# Patient Record
Sex: Female | Born: 1970 | Race: Black or African American | Hispanic: No | Smoking: Never smoker
Health system: Southern US, Community
[De-identification: ages and names within clinical notes are randomized; demographics above are authoritative.]

## PROBLEM LIST (undated history)

## (undated) DIAGNOSIS — Z21 Asymptomatic human immunodeficiency virus [HIV] infection status: Secondary | ICD-10-CM

## (undated) DIAGNOSIS — B20 Human immunodeficiency virus [HIV] disease: Secondary | ICD-10-CM

## (undated) DIAGNOSIS — E119 Type 2 diabetes mellitus without complications: Secondary | ICD-10-CM

## (undated) HISTORY — DX: Type 2 diabetes mellitus without complications: E11.9

## (undated) HISTORY — DX: Human immunodeficiency virus (HIV) disease: B20

## (undated) HISTORY — PX: OTHER SURGICAL HISTORY: SHX169

## (undated) HISTORY — DX: Asymptomatic human immunodeficiency virus (hiv) infection status: Z21

---

## 2006-09-08 ENCOUNTER — Ambulatory Visit: Payer: Self-pay | Admitting: Internal Medicine

## 2006-09-08 ENCOUNTER — Encounter: Admission: RE | Admit: 2006-09-08 | Discharge: 2006-09-08 | Payer: Self-pay | Admitting: Internal Medicine

## 2006-09-08 LAB — CONVERTED CEMR LAB
Albumin: 4.1 g/dL (ref 3.5–5.2)
Alkaline Phosphatase: 65 units/L (ref 39–117)
CO2: 23 meq/L (ref 19–32)
Calcium: 9.7 mg/dL (ref 8.4–10.5)
Chloride: 105 meq/L (ref 96–112)
GC Probe Amp, Urine: NEGATIVE
Glucose, Bld: 88 mg/dL (ref 70–99)
HIV 1 RNA Quant: 714 copies/mL — ABNORMAL HIGH (ref <50)
HIV-1 RNA Quant, Log: 2.85 — ABNORMAL HIGH (ref <1.70)
HIV-1 antibody: POSITIVE — AB
Hep B Core Total Ab: POSITIVE — AB
Hepatitis B Surface Ag: NEGATIVE
Leukocytes, UA: NEGATIVE
Lymphocytes Relative: 49 % — ABNORMAL HIGH (ref 12–46)
Lymphs Abs: 1.9 10*3/uL (ref 0.7–3.3)
Monocytes Relative: 9 % (ref 3–11)
Neutrophils Relative %: 39 % — ABNORMAL LOW (ref 43–77)
Platelets: 330 10*3/uL (ref 150–400)
Potassium: 4 meq/L (ref 3.5–5.3)
Protein, ur: NEGATIVE mg/dL
RBC: 4.34 M/uL (ref 3.87–5.11)
Sodium: 139 meq/L (ref 135–145)
Specific Gravity, Urine: 1.028 (ref 1.005–1.03)
Total Protein: 8.2 g/dL (ref 6.0–8.3)
Urine Glucose: NEGATIVE mg/dL
WBC: 3.8 10*3/uL — ABNORMAL LOW (ref 4.0–10.5)

## 2006-09-15 ENCOUNTER — Emergency Department (HOSPITAL_COMMUNITY): Admission: EM | Admit: 2006-09-15 | Discharge: 2006-09-15 | Payer: Self-pay | Admitting: Emergency Medicine

## 2006-09-17 ENCOUNTER — Encounter: Payer: Self-pay | Admitting: Internal Medicine

## 2006-09-23 ENCOUNTER — Encounter: Admission: RE | Admit: 2006-09-23 | Discharge: 2006-09-23 | Payer: Self-pay | Admitting: General Surgery

## 2006-09-29 ENCOUNTER — Ambulatory Visit: Payer: Self-pay | Admitting: Internal Medicine

## 2006-09-29 DIAGNOSIS — N939 Abnormal uterine and vaginal bleeding, unspecified: Secondary | ICD-10-CM | POA: Insufficient documentation

## 2006-09-29 DIAGNOSIS — B20 Human immunodeficiency virus [HIV] disease: Secondary | ICD-10-CM | POA: Insufficient documentation

## 2006-09-29 DIAGNOSIS — N926 Irregular menstruation, unspecified: Secondary | ICD-10-CM | POA: Insufficient documentation

## 2006-11-01 ENCOUNTER — Ambulatory Visit (HOSPITAL_COMMUNITY): Admission: RE | Admit: 2006-11-01 | Discharge: 2006-11-02 | Payer: Self-pay | Admitting: General Surgery

## 2006-11-30 ENCOUNTER — Ambulatory Visit: Payer: Self-pay | Admitting: Infectious Diseases

## 2006-11-30 ENCOUNTER — Encounter: Payer: Self-pay | Admitting: Internal Medicine

## 2006-11-30 ENCOUNTER — Encounter: Admission: RE | Admit: 2006-11-30 | Discharge: 2006-11-30 | Payer: Self-pay | Admitting: Infectious Diseases

## 2006-11-30 LAB — CONVERTED CEMR LAB
Albumin: 4 g/dL (ref 3.5–5.2)
CO2: 24 meq/L (ref 19–32)
Eosinophils Relative: 5 % (ref 0–5)
Glucose, Bld: 127 mg/dL — ABNORMAL HIGH (ref 70–99)
HCT: 37.1 % (ref 36.0–46.0)
HIV 1 RNA Quant: 671 copies/mL — ABNORMAL HIGH (ref <50)
HIV-1 RNA Quant, Log: 2.83 — ABNORMAL HIGH (ref <1.70)
Lymphocytes Relative: 41 % (ref 12–46)
Lymphs Abs: 1.9 10*3/uL (ref 0.7–4.0)
Neutrophils Relative %: 47 % (ref 43–77)
Platelets: 359 10*3/uL (ref 150–400)
Potassium: 3.9 meq/L (ref 3.5–5.3)
RBC: 4.35 M/uL (ref 3.87–5.11)
Sodium: 138 meq/L (ref 135–145)
Total Protein: 8.1 g/dL (ref 6.0–8.3)
WBC: 4.7 10*3/uL (ref 4.0–10.5)

## 2006-12-24 ENCOUNTER — Ambulatory Visit: Payer: Self-pay | Admitting: Internal Medicine

## 2006-12-24 DIAGNOSIS — K439 Ventral hernia without obstruction or gangrene: Secondary | ICD-10-CM | POA: Insufficient documentation

## 2007-01-05 ENCOUNTER — Encounter: Payer: Self-pay | Admitting: Internal Medicine

## 2007-01-11 ENCOUNTER — Encounter (INDEPENDENT_AMBULATORY_CARE_PROVIDER_SITE_OTHER): Payer: Self-pay | Admitting: *Deleted

## 2007-01-26 ENCOUNTER — Encounter: Payer: Self-pay | Admitting: Internal Medicine

## 2007-01-26 ENCOUNTER — Ambulatory Visit: Payer: Self-pay | Admitting: Gynecology

## 2007-01-26 ENCOUNTER — Encounter: Payer: Self-pay | Admitting: Obstetrics and Gynecology

## 2007-02-03 ENCOUNTER — Encounter: Payer: Self-pay | Admitting: Internal Medicine

## 2007-04-21 ENCOUNTER — Ambulatory Visit: Payer: Self-pay | Admitting: Infectious Disease

## 2007-04-21 ENCOUNTER — Encounter: Admission: RE | Admit: 2007-04-21 | Discharge: 2007-04-21 | Payer: Self-pay | Admitting: Internal Medicine

## 2007-04-21 ENCOUNTER — Encounter: Payer: Self-pay | Admitting: Internal Medicine

## 2007-04-21 LAB — CONVERTED CEMR LAB
Absolute CD4: 359 #/uL — ABNORMAL LOW (ref 381–1469)
Alkaline Phosphatase: 66 units/L (ref 39–117)
Basophils Absolute: 0 10*3/uL (ref 0.0–0.1)
CD4 T Helper %: 19 % — ABNORMAL LOW (ref 32–62)
Eosinophils Absolute: 0.1 10*3/uL (ref 0.0–0.7)
Eosinophils Relative: 2 % (ref 0–5)
Glucose, Bld: 131 mg/dL — ABNORMAL HIGH (ref 70–99)
HCT: 37.3 % (ref 36.0–46.0)
MCV: 85.6 fL (ref 78.0–100.0)
Platelets: 331 10*3/uL (ref 150–400)
RDW: 13.1 % (ref 11.5–15.5)
Sodium: 140 meq/L (ref 135–145)
Total Bilirubin: 0.7 mg/dL (ref 0.3–1.2)
Total Protein: 8.1 g/dL (ref 6.0–8.3)
Total lymphocyte count: 1890 cells/mcL (ref 700–3300)
WBC, lymph enumeration: 4.2 10*3/uL (ref 4.0–10.5)

## 2007-05-02 ENCOUNTER — Encounter: Payer: Self-pay | Admitting: Internal Medicine

## 2007-06-30 ENCOUNTER — Encounter: Admission: RE | Admit: 2007-06-30 | Discharge: 2007-09-28 | Payer: Self-pay | Admitting: Orthopaedic Surgery

## 2007-08-17 ENCOUNTER — Ambulatory Visit (HOSPITAL_COMMUNITY): Admission: RE | Admit: 2007-08-17 | Discharge: 2007-08-17 | Payer: Self-pay | Admitting: Obstetrics & Gynecology

## 2007-08-18 ENCOUNTER — Telehealth: Payer: Self-pay

## 2007-08-23 ENCOUNTER — Encounter: Admission: RE | Admit: 2007-08-23 | Discharge: 2007-08-23 | Payer: Self-pay | Admitting: Internal Medicine

## 2007-08-23 ENCOUNTER — Ambulatory Visit: Payer: Self-pay | Admitting: Internal Medicine

## 2007-08-23 LAB — CONVERTED CEMR LAB
ALT: 50 units/L — ABNORMAL HIGH (ref 0–35)
AST: 37 units/L (ref 0–37)
Albumin: 4 g/dL (ref 3.5–5.2)
Alkaline Phosphatase: 56 units/L (ref 39–117)
Basophils Absolute: 0 10*3/uL (ref 0.0–0.1)
Basophils Relative: 0 % (ref 0–1)
Eosinophils Absolute: 0.1 10*3/uL (ref 0.0–0.7)
HIV 1 RNA Quant: 1680 {copies}/mL — ABNORMAL HIGH
HIV-1 RNA Quant, Log: 3.23 — ABNORMAL HIGH
Lymphs Abs: 2 10*3/uL (ref 0.7–4.0)
MCHC: 32.4 g/dL (ref 30.0–36.0)
MCV: 88.2 fL (ref 78.0–100.0)
Neutrophils Relative %: 42 % — ABNORMAL LOW (ref 43–77)
Platelets: 399 10*3/uL (ref 150–400)
Potassium: 3.7 meq/L (ref 3.5–5.3)
RDW: 13.7 % (ref 11.5–15.5)
Sodium: 138 meq/L (ref 135–145)
Total Bilirubin: 0.8 mg/dL (ref 0.3–1.2)
Total Protein: 8.2 g/dL (ref 6.0–8.3)
WBC: 4.2 10*3/uL (ref 4.0–10.5)

## 2007-09-06 ENCOUNTER — Ambulatory Visit: Payer: Self-pay | Admitting: Internal Medicine

## 2007-09-27 ENCOUNTER — Encounter: Payer: Self-pay | Admitting: Internal Medicine

## 2007-10-11 ENCOUNTER — Encounter: Payer: Self-pay | Admitting: Internal Medicine

## 2007-10-20 ENCOUNTER — Ambulatory Visit: Payer: Self-pay | Admitting: Internal Medicine

## 2007-10-20 LAB — CONVERTED CEMR LAB
ALT: 11 units/L (ref 0–35)
AST: 16 units/L (ref 0–37)
Albumin: 4 g/dL (ref 3.5–5.2)
BUN: 18 mg/dL (ref 6–23)
Basophils Relative: 0 % (ref 0–1)
Calcium: 9 mg/dL (ref 8.4–10.5)
Chloride: 108 meq/L (ref 96–112)
Eosinophils Absolute: 0.1 10*3/uL (ref 0.0–0.7)
Eosinophils Relative: 3 % (ref 0–5)
HCT: 37 % (ref 36.0–46.0)
Hemoglobin: 12.3 g/dL (ref 12.0–15.0)
MCV: 90 fL (ref 78.0–100.0)
Monocytes Absolute: 0.3 10*3/uL (ref 0.1–1.0)
Platelets: 314 10*3/uL (ref 150–400)
Potassium: 3.9 meq/L (ref 3.5–5.3)
RDW: 13 % (ref 11.5–15.5)

## 2007-11-09 ENCOUNTER — Ambulatory Visit: Payer: Self-pay | Admitting: Internal Medicine

## 2008-02-09 ENCOUNTER — Ambulatory Visit: Payer: Self-pay | Admitting: Internal Medicine

## 2008-02-09 LAB — CONVERTED CEMR LAB
Alkaline Phosphatase: 69 units/L (ref 39–117)
BUN: 14 mg/dL (ref 6–23)
Glucose, Bld: 110 mg/dL — ABNORMAL HIGH (ref 70–99)
HIV-1 RNA Quant, Log: <1.68 (ref <1.68)
Sodium: 142 meq/L (ref 135–145)
Total Bilirubin: 0.3 mg/dL (ref 0.3–1.2)

## 2008-02-24 ENCOUNTER — Ambulatory Visit: Payer: Self-pay | Admitting: Internal Medicine

## 2008-02-24 DIAGNOSIS — R519 Headache, unspecified: Secondary | ICD-10-CM | POA: Insufficient documentation

## 2008-02-24 DIAGNOSIS — R51 Headache: Secondary | ICD-10-CM | POA: Insufficient documentation

## 2008-03-21 ENCOUNTER — Encounter: Admission: RE | Admit: 2008-03-21 | Discharge: 2008-05-08 | Payer: Self-pay | Admitting: Orthopaedic Surgery

## 2008-04-11 ENCOUNTER — Encounter: Admission: RE | Admit: 2008-04-11 | Discharge: 2008-04-11 | Payer: Self-pay | Admitting: Orthopedic Surgery

## 2008-05-24 ENCOUNTER — Ambulatory Visit: Payer: Self-pay | Admitting: Internal Medicine

## 2008-05-24 LAB — CONVERTED CEMR LAB
ALT: 13 units/L (ref 0–35)
Basophils Absolute: 0 10*3/uL (ref 0.0–0.1)
Basophils Relative: 0 % (ref 0–1)
CO2: 22 meq/L (ref 19–32)
Calcium: 8.6 mg/dL (ref 8.4–10.5)
Chloride: 106 meq/L (ref 96–112)
GFR calc Af Amer: >60 mL/min (ref >60)
HIV 1 RNA Quant: <48 copies/mL (ref <48)
MCHC: 32.9 g/dL (ref 30.0–36.0)
Monocytes Absolute: 0.5 10*3/uL (ref 0.1–1.0)
Neutro Abs: 2.7 10*3/uL (ref 1.7–7.7)
Neutrophils Relative %: 45 % (ref 43–77)
Potassium: 4.1 meq/L (ref 3.5–5.3)
RDW: 12.8 % (ref 11.5–15.5)
Sodium: 136 meq/L (ref 135–145)
Total Protein: 7 g/dL (ref 6.0–8.3)

## 2008-06-08 ENCOUNTER — Ambulatory Visit: Payer: Self-pay | Admitting: Internal Medicine

## 2008-06-15 ENCOUNTER — Ambulatory Visit (HOSPITAL_COMMUNITY): Admission: RE | Admit: 2008-06-15 | Discharge: 2008-06-15 | Payer: Self-pay | Admitting: Internal Medicine

## 2009-02-19 ENCOUNTER — Ambulatory Visit: Payer: Self-pay | Admitting: Internal Medicine

## 2009-02-19 LAB — CONVERTED CEMR LAB
Alkaline Phosphatase: 63 units/L (ref 39–117)
BUN: 11 mg/dL (ref 6–23)
Basophils Absolute: 0 10*3/uL (ref 0.0–0.1)
Basophils Relative: 0 % (ref 0–1)
Cholesterol: 206 mg/dL — ABNORMAL HIGH (ref 0–200)
Creatinine, Ser: 0.71 mg/dL (ref 0.40–1.20)
Eosinophils Absolute: 0.2 10*3/uL (ref 0.0–0.7)
Glucose, Bld: 111 mg/dL — ABNORMAL HIGH (ref 70–99)
HDL: 51 mg/dL (ref >39)
Hemoglobin: 12.6 g/dL (ref 12.0–15.0)
LDL Cholesterol: 128 mg/dL — ABNORMAL HIGH (ref 0–99)
MCHC: 33.6 g/dL (ref 30.0–36.0)
MCV: 86.4 fL (ref >=78.0)
Monocytes Absolute: 0.4 10*3/uL (ref 0.1–1.0)
Neutro Abs: 2.7 10*3/uL (ref 1.7–7.7)
RDW: 13.1 % (ref 11.5–15.5)
Sodium: 138 meq/L (ref 135–145)
Total Bilirubin: 0.4 mg/dL (ref 0.3–1.2)
Total CHOL/HDL Ratio: 4
Triglycerides: 133 mg/dL (ref <150)
VLDL: 27 mg/dL (ref 0–40)

## 2009-03-06 ENCOUNTER — Ambulatory Visit: Payer: Self-pay | Admitting: Internal Medicine

## 2009-03-18 ENCOUNTER — Encounter (INDEPENDENT_AMBULATORY_CARE_PROVIDER_SITE_OTHER): Payer: Self-pay | Admitting: *Deleted

## 2009-03-18 ENCOUNTER — Ambulatory Visit: Payer: Self-pay | Admitting: Internal Medicine

## 2009-03-22 ENCOUNTER — Encounter: Payer: Self-pay | Admitting: Internal Medicine

## 2009-03-27 ENCOUNTER — Encounter: Payer: Self-pay | Admitting: Internal Medicine

## 2009-04-11 ENCOUNTER — Encounter: Payer: Self-pay | Admitting: Internal Medicine

## 2009-05-26 IMAGING — CT CT PELVIS W/ CM
2 of 5 series · 17 of 46 positions shown, 19 images · IV contrast (READICAT/WATER & [ID] OMNI 300)
Comparison: [REDACTED] acute abdominal series 09/15/06.

CLINICAL DATA: 12-years generalized abdominal pain.  Abdominal trauma in 3556 with unknown abdominal surgical history. 
ABDOMEN CT WITH CONTRAST:
TECHNIQUE: Multidetector CT imaging of the abdomen was performed following the standard protocol during bolus administration of intravenous contrast.
Contrast:  125 cc Omnipaque 300
TECHNIQUE: Multidetector CT imaging of the pelvis was performed following the standard protocol during bolus administration of intravenous contrast.

[Series 3: routine abdomen · axial · 0.74mm/px · z∈[-373,+27]mm · 14 of 91 slices shown, 16 images]
[im 6/91  soft-tissue]
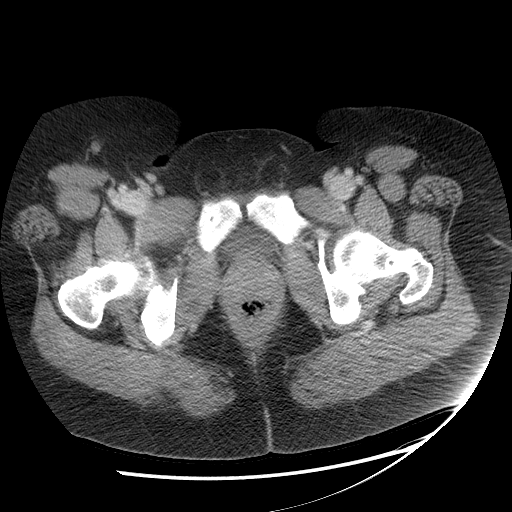
[im 6/91  bone]
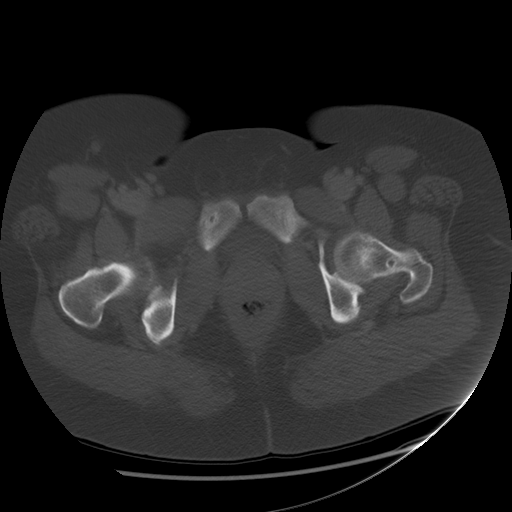
[im 11/91  soft-tissue]
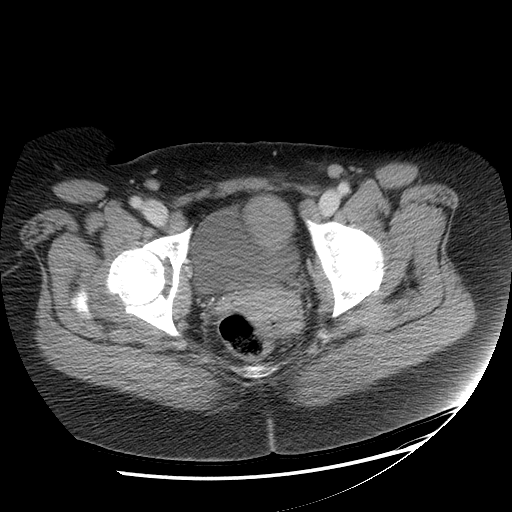
[im 21/91  soft-tissue]
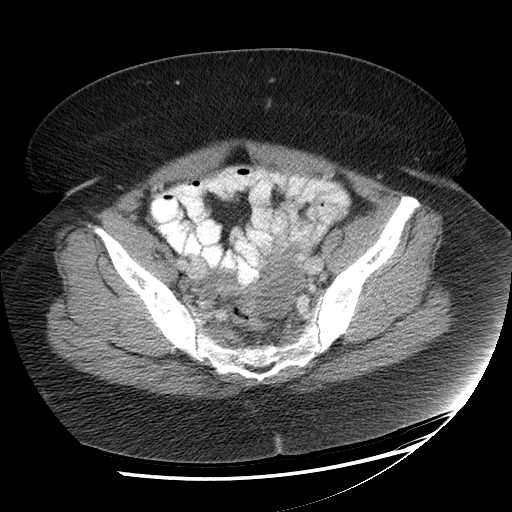
[im 26/91  soft-tissue]
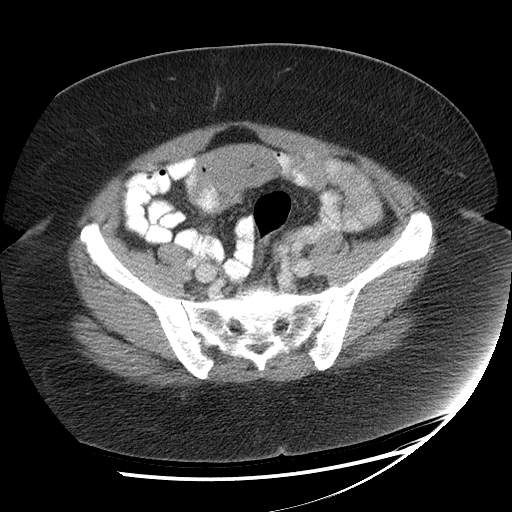
[im 31/91  soft-tissue]
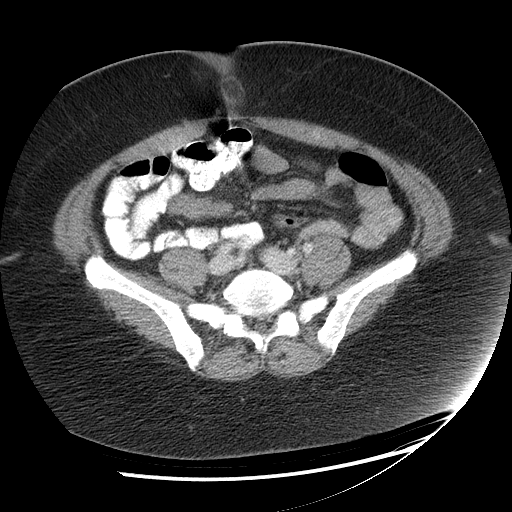
[im 36/91  soft-tissue]
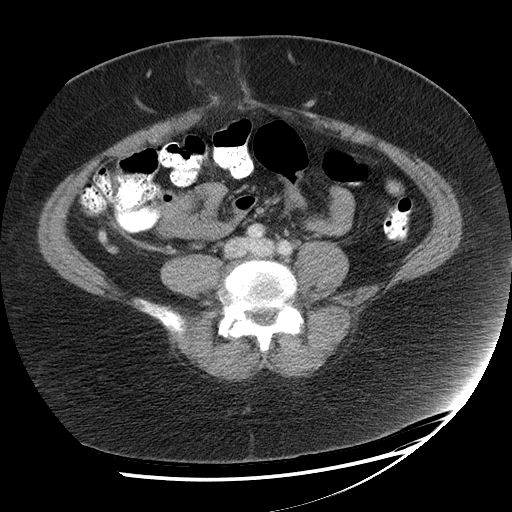
[im 41/91  soft-tissue]
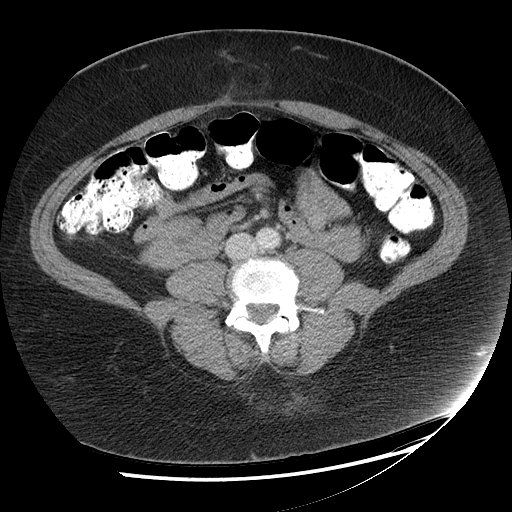
[im 51/91  soft-tissue]
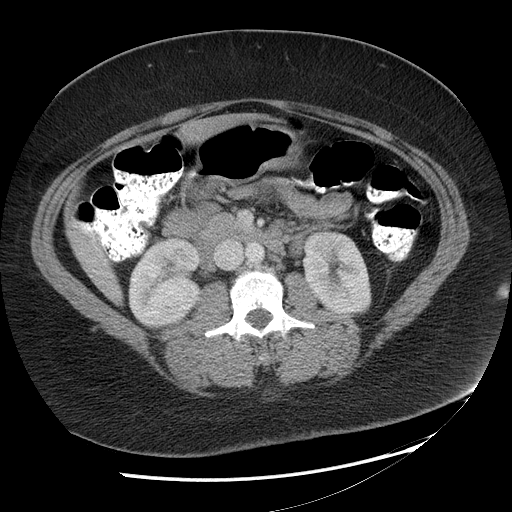
[im 56/91  soft-tissue]
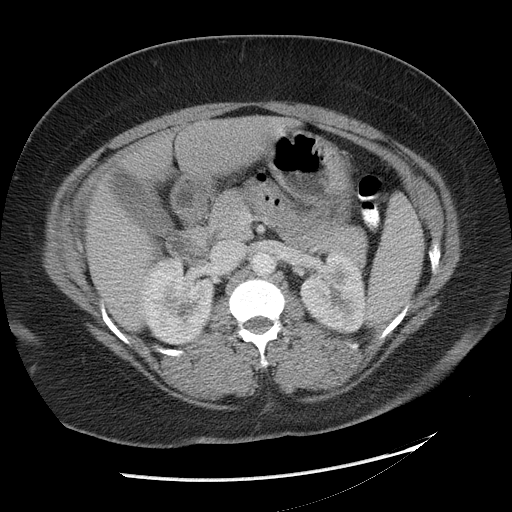
[im 56/91  bone]
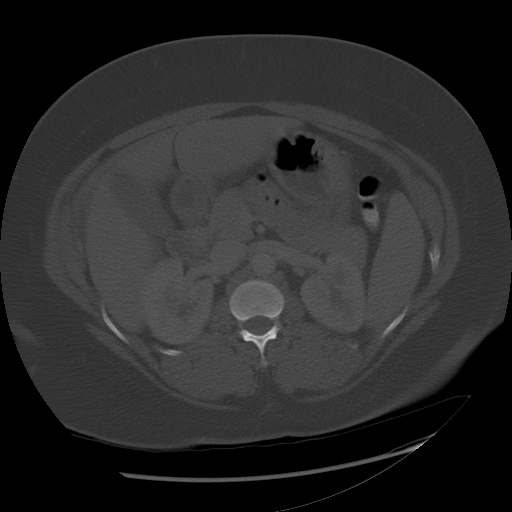
[im 61/91  soft-tissue]
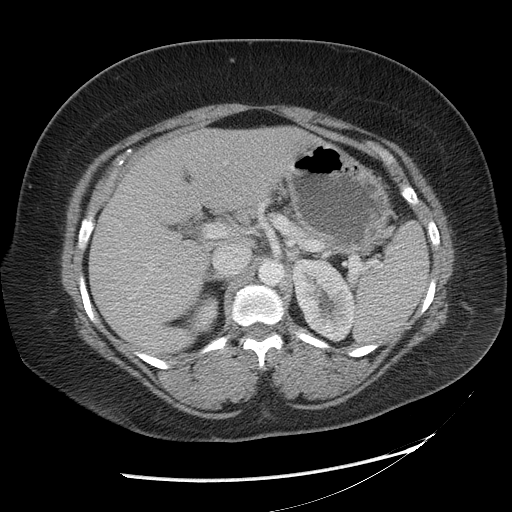
[im 66/91  soft-tissue]
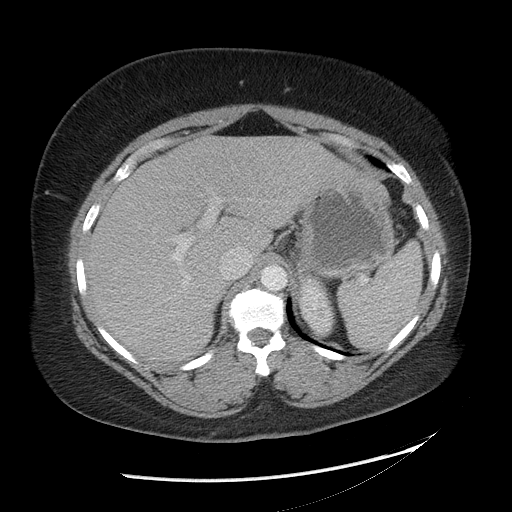
[im 71/91  soft-tissue]
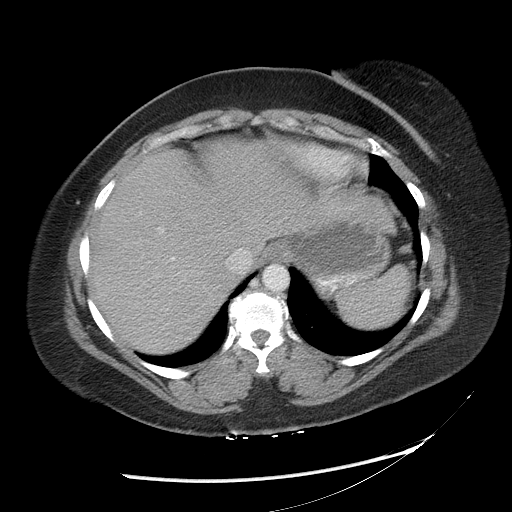
[im 81/91  soft-tissue]
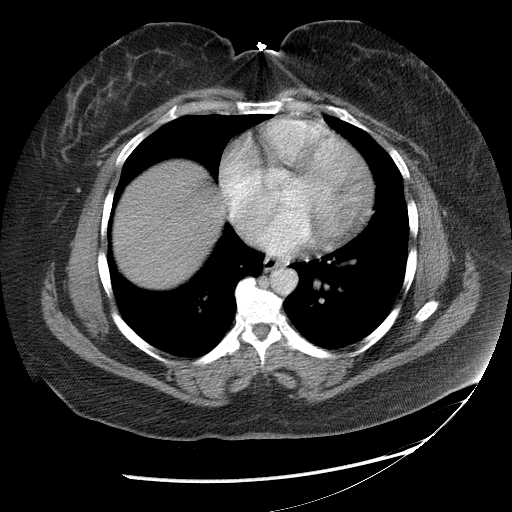
[im 86/91  soft-tissue]
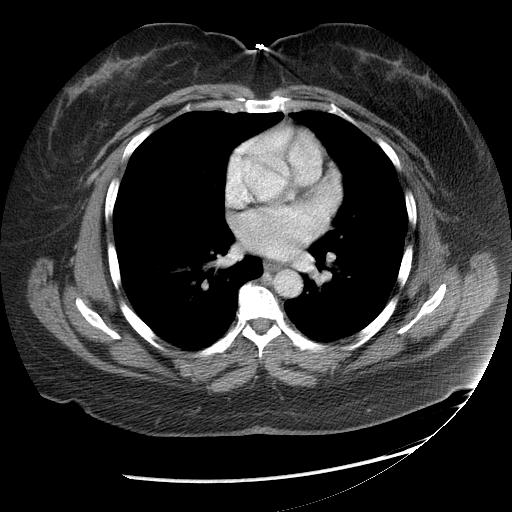

[Series 602: sagittal body · sagittal · 0.93mm/px · 3 of 153 slices shown]
[im 51/153  soft-tissue]
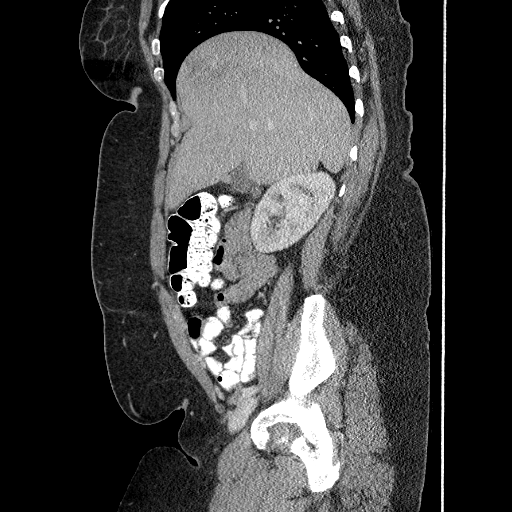
[im 68/153  soft-tissue]
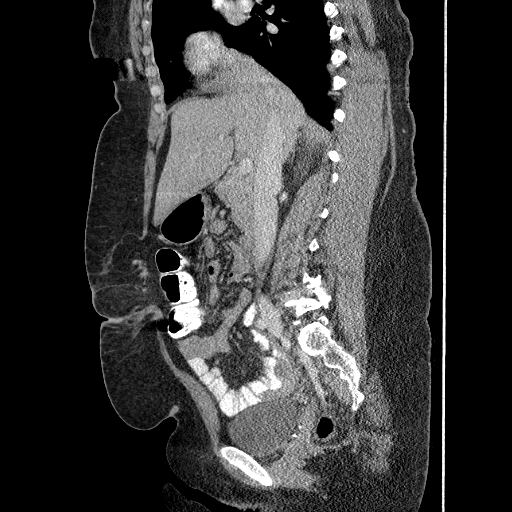
[im 85/153  soft-tissue]
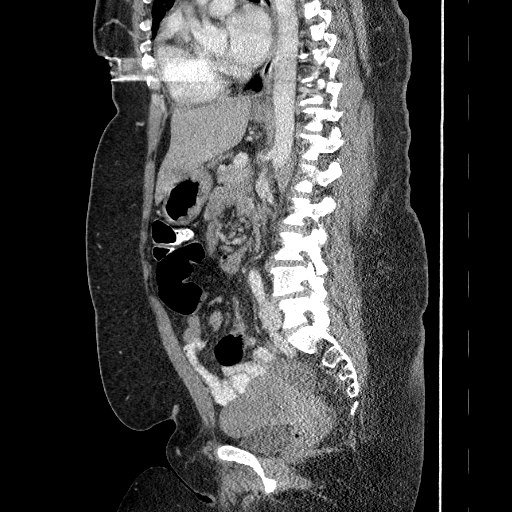

[17 of 46 positions shown; findings below may reference images not displayed]

FINDINGS: Two ventral hernias are seen-- more superiorly measuring 5.5 cm wide containing fat only.  uperior and slightly to the right at umbilical level is 1.7 cm hernia containing fat only (image 56 and prior image 48).  Lung bases are clear.  Slight diffuse fatty infiltration of the liver is noted.  11 mm inferior accessory spleen, advanced (right greater than left) L4-5 with moderate bilateral L3-4 and slight left L5-S1 facet degenerative joint disease is seen.  Remaining abdominal organs appear normal without inflammation, free fluid, nor adenopathy.
IMPRESSION: 1.  Two ventral hernias containing fat only as described. 
2.  Slight diffuse fatty infiltration of the liver. 
3.  Facet degenerative joint disease maximal L4-5.  
4.  Otherwise negative. 
PELVIS CT WITH CONTRAST:
FINDINGS: Probable left adnexa upper limits of normal in size is seen posterior to the uterus (axial image 72 and sagittal image 85)--uterine fibroid is possible.  Remaining pelvic organs appear normal without inflammation, free fluid, nor adenopathy.
IMPRESSION: 1.  Left adnexa probably upper limits of normal in size--uterine fibroid is possible. 
2.  No significant abnormality.

## 2009-06-05 ENCOUNTER — Ambulatory Visit: Payer: Self-pay | Admitting: Internal Medicine

## 2009-06-05 LAB — CONVERTED CEMR LAB
Albumin: 3.8 g/dL (ref 3.5–5.2)
Alkaline Phosphatase: 65 units/L (ref 39–117)
BUN: 13 mg/dL (ref 6–23)
Basophils Absolute: 0 10*3/uL (ref 0.0–0.1)
Basophils Relative: 0 % (ref 0–1)
CO2: 25 meq/L (ref 19–32)
Eosinophils Relative: 3 % (ref 0–5)
Glucose, Bld: 135 mg/dL — ABNORMAL HIGH (ref 70–99)
HCT: 36.1 % (ref 36.0–46.0)
HIV 1 RNA Quant: <48 copies/mL (ref <48)
HIV-1 RNA Quant, Log: <1.68 (ref <1.68)
Hemoglobin: 11.4 g/dL — ABNORMAL LOW (ref 12.0–15.0)
MCHC: 31.6 g/dL (ref 30.0–36.0)
Monocytes Absolute: 0.4 10*3/uL (ref 0.1–1.0)
Monocytes Relative: 8 % (ref 3–12)
Potassium: 4.5 meq/L (ref 3.5–5.3)
RBC: 4.07 M/uL (ref 3.87–5.11)
RDW: 13.1 % (ref 11.5–15.5)
Sodium: 140 meq/L (ref 135–145)
Total Bilirubin: 0.4 mg/dL (ref 0.3–1.2)
Total Protein: 7.2 g/dL (ref 6.0–8.3)

## 2009-06-18 ENCOUNTER — Inpatient Hospital Stay (HOSPITAL_COMMUNITY): Admission: AD | Admit: 2009-06-18 | Discharge: 2009-06-18 | Payer: Self-pay | Admitting: Family Medicine

## 2009-06-18 ENCOUNTER — Ambulatory Visit: Payer: Self-pay | Admitting: Nurse Practitioner

## 2009-06-19 ENCOUNTER — Encounter (INDEPENDENT_AMBULATORY_CARE_PROVIDER_SITE_OTHER): Payer: Self-pay | Admitting: *Deleted

## 2009-06-19 ENCOUNTER — Ambulatory Visit: Payer: Self-pay | Admitting: Internal Medicine

## 2009-08-08 ENCOUNTER — Telehealth: Payer: Self-pay | Admitting: Internal Medicine

## 2009-08-14 ENCOUNTER — Telehealth: Payer: Self-pay | Admitting: Internal Medicine

## 2009-09-10 ENCOUNTER — Telehealth: Payer: Self-pay | Admitting: Internal Medicine

## 2009-10-08 ENCOUNTER — Telehealth: Payer: Self-pay | Admitting: Internal Medicine

## 2009-11-12 ENCOUNTER — Telehealth (INDEPENDENT_AMBULATORY_CARE_PROVIDER_SITE_OTHER): Payer: Self-pay | Admitting: *Deleted

## 2009-12-02 ENCOUNTER — Ambulatory Visit: Payer: Self-pay | Admitting: Internal Medicine

## 2009-12-02 LAB — CONVERTED CEMR LAB
ALT: 14 units/L (ref 0–35)
AST: 15 units/L (ref 0–37)
Basophils Absolute: 0 10*3/uL (ref 0.0–0.1)
Basophils Relative: 0 % (ref 0–1)
CO2: 26 meq/L (ref 19–32)
Creatinine, Ser: 0.74 mg/dL (ref 0.40–1.20)
Eosinophils Absolute: 0.2 10*3/uL (ref 0.0–0.7)
Eosinophils Relative: 4 % (ref 0–5)
HCT: 36.2 % (ref 36.0–46.0)
HIV 1 RNA Quant: <20 copies/mL (ref <20)
HIV-1 RNA Quant, Log: <1.3 (ref <1.30)
MCHC: 32 g/dL (ref 30.0–36.0)
MCV: 88.7 fL (ref 78.0–100.0)
Neutrophils Relative %: 47 % (ref 43–77)
Platelets: 365 10*3/uL (ref 150–400)
RDW: 12.8 % (ref 11.5–15.5)
Sodium: 139 meq/L (ref 135–145)
Total Bilirubin: 0.4 mg/dL (ref 0.3–1.2)
Total Protein: 6.9 g/dL (ref 6.0–8.3)

## 2009-12-09 ENCOUNTER — Telehealth (INDEPENDENT_AMBULATORY_CARE_PROVIDER_SITE_OTHER): Payer: Self-pay | Admitting: *Deleted

## 2009-12-16 ENCOUNTER — Ambulatory Visit: Payer: Self-pay | Admitting: Internal Medicine

## 2010-02-11 NOTE — Miscellaneous (Signed)
Summary: Appointment Canceled  Appointment status changed to canceled by [employer] on 04/11/2009 12:59 PM.  Cancellation Comments --------------------- lab [mkj]  Appointment Information ----------------------- Appt Type:  LAB NO DOCUMENT      Date:  Wednesday, Jun 05, 2009      Time:  9:00 AM for 30 min   Urgency:  Routine   Made By:  Pearson Grippe  To Visit:  [id number]    Reason:  lab [mkj]  Appt Comments ------------- -- 04/11/09 12:59: ([facility]) CANCELED -- lab [mkj] -- 04/11/09 12:58: ([facility]) BOOKED -- Routine LAB NO DOCUMENT at 06/05/2009 9:00 AM for 30 min lab [mkj] Unknown resources scheduled: [id number]

## 2010-02-11 NOTE — Assessment & Plan Note (Signed)
Summary: PAP SMEAR/VS   Vital Signs:  Patient profile:   40 year old female Menstrual status:  regular LMP:     02/26/2009  Vitals Entered By: Jennet Maduro RN (March 18, 2009 1:52 PM) CC: PAP smear appt.  Pt. given condoms today.  Pt. given educational materials re:  HIV and women, exercise, diet, nutrition, BSE and self-esteem. LMP (date): 02/26/2009     Menstrual Status regular Enter LMP: 02/26/2009 Last PAP Result Normal  Prior Medications: ATRIPLA 600-200-300 MG TABS (EFAVIRENZ-EMTRICITAB-TENOFOVIR) Take 1 tablet by mouth at bedtime ENSURE  LIQD (NUTRITIONAL SUPPLEMENTS) one can two times a day Current Allergies: No known allergies  Immunizations Administered:  Hepatitis B Vaccine # 3:    Vaccine Type: HepB Adult    Site: left deltoid    Mfr: Merck    Dose: 0.5 ml    Route: IM    Given by: Jennet Maduro RN    Exp. Date: 05/13/2011    Lot #: [id number]  Orders Added: 1)  T-PAP [facility]) [88142] 2)  Est. Patient Level I [10932] 3)  Hepatitis B Vaccine >85yrs [90746] 4)  Admin 1st Vaccine [90471] 5)  Admin 1st Vaccine [90471] 6)  Flu Vaccine 84yrs + [35573]  Flu Vaccine Consent Questions     Do you have a history of severe allergic reactions to this vaccine? no    Any prior history of allergic reactions to egg and/or gelatin? no    Do you have a sensitivity to the preservative Thimersol? no    Do you have a past history of Guillan-Barre Syndrome? no    Do you currently have an acute febrile illness? no    Have you ever had a severe reaction to latex? no    Vaccine information given and explained to patient? yes    Are you currently pregnant? no    Lot [id number]   Exp Date:04/12/2009   Manufacturer: [employer]    Site Given  Left Deltoid IMDenise Estridge RN  March 18, 2009 2:18 PM  .Roque Lias

## 2010-02-11 NOTE — Miscellaneous (Signed)
Summary: Problem list update  Clinical Lists Changes  Problems: Added new problem of SCREENING FOR MALIGNANT NEOPLASM OF THE CERVIX (ICD-V76.2) 

## 2010-02-11 NOTE — Progress Notes (Signed)
Summary: new script/pt approved via [employer]  Phone Note From Other Clinic   Caller: [facility]- Lonell Face Summary of Call: Received fax stating that patient has now been approved for medications via [employer].  She would like medications to be called into [facility]. Patient will like them to be sent to [facility] office. Initial call taken by: Paulo Fruit  BS,CPht II,MPH,  August 08, 2009 11:30 AM    Prescriptions: ATRIPLA 600-200-300 MG TABS (EFAVIRENZ-EMTRICITAB-TENOFOVIR) Take 1 tablet by mouth at bedtime  #30 x 5   Entered by:   Paulo Fruit  BS,CPht II,MPH   Authorized by:   Yisroel Ramming MD   Signed by:   Paulo Fruit  BS,CPht II,MPH on 08/08/2009   Method used:   Electronically to        [facility] [phone]* (retail)       [address]       [place], [place]  [address]       Ph: [phone]       Fax:    RxID:   [id number]  Paulo Fruit  BS,CPht II,MPH  August 08, 2009 11:31 AM

## 2010-02-11 NOTE — Progress Notes (Signed)
Summary: ADAP rxes arrived  Phone Note Outgoing Call   Call placed by: Jennet Maduro RN,  December 09, 2009 2:37 PM Call placed to: Patient Action Taken: Assistance medications ready for pick up Summary of Call: Rxes ready for pick-up.  Jennet Maduro RN  December 09, 2009 2:38 PM     Prescriptions: ATRIPLA 600-200-300 MG TABS (EFAVIRENZ-EMTRICITAB-TENOFOVIR) Take 1 tablet by mouth at bedtime  #30 x prn   Entered by:   Jennet Maduro RN   Authorized by:   Yisroel Ramming MD   Signed by:   Jennet Maduro RN on 12/09/2009   Method used:   Samples Given   RxID:   [id number]   Appended Document: ADAP rxes arrived pt. picked up ADAP meds

## 2010-02-11 NOTE — Progress Notes (Signed)
Summary: ncadap med arrived for sept-pt aware  Phone Note Refill Request      Prescriptions: ATRIPLA 600-200-300 MG TABS (EFAVIRENZ-EMTRICITAB-TENOFOVIR) Take 1 tablet by mouth at bedtime  #30 x 0   Entered by:   Paulo Fruit  BS,CPht II,MPH   Authorized by:   Yisroel Ramming MD   Signed by:   Paulo Fruit  BS,CPht II,MPH on 10/08/2009   Method used:   Samples Given   RxID:   [id number]  Patient Assist Medication Verification: Medication name: Neva Seat # [id number] Tech approval:mld Call placed to patient with message that assistance medications are ready for pick-up. Patient said she will pick up Wednesday 10/09/09 Paulo Fruit  BS,CPht II,MPH  October 08, 2009 4:56 PM

## 2010-02-11 NOTE — Progress Notes (Signed)
Summary: ADAP med. arrived  Phone Note Outgoing Call   Call placed by: Annice Pih Summary of Call: pt. notified ADAP med. ready for pick up, Atripla Initial call taken by: Wendall Mola CMA Duncan Dull),  November 12, 2009 12:31 PM

## 2010-02-11 NOTE — Progress Notes (Signed)
Summary: ncadap med arrived for Aug  Phone Note Refill Request      Prescriptions: ATRIPLA 600-200-300 MG TABS (EFAVIRENZ-EMTRICITAB-TENOFOVIR) Take 1 tablet by mouth at bedtime  #30 x 0   Entered by:   Paulo Fruit  BS,CPht II,MPH   Authorized by:   Yisroel Ramming MD   Signed by:   Paulo Fruit  BS,CPht II,MPH on 09/10/2009   Method used:   Samples Given   RxID:   [id number]  Patient Assist Medication Verification: Medication name: Atripla RX # [id number] Tech approval:MLD Call placed to patient with message that assistance medications are ready for pick-up. Paulo Fruit  BS,CPht II,MPH  September 10, 2009 4:39 PM

## 2010-02-11 NOTE — Miscellaneous (Signed)
Summary: Orders Update  Clinical Lists Changes  Orders: Added new Test order of T-CBC w/Diff (85025-10010) - Signed Added new Test order of T-CD4SP ([facility]) (CD4SP) - Signed Added new Test order of T-Comprehensive Metabolic Panel ([id number]) - Signed Added new Test order of T-HIV Viral Load (87536-83319) - Signed 

## 2010-02-11 NOTE — Miscellaneous (Signed)
Summary: Orders Update  Clinical Lists Changes  Orders: Added new Test order of T-CBC w/Diff [phone]) - Signed Added new Test order of T-CD4SP [facility]) (CD4SP) - Signed Added new Test order of T-Comprehensive Metabolic Panel [phone]) - Signed Added new Test order of T-HIV Viral Load [phone]) - Signed Added new Test order of T-Lipid Profile (60630-16010) - Signed Added new Test order of T-RPR (Syphilis) ([id number]) - Signed     Process Orders Check Orders Results:     [facility]: ABN not required for this insurance Order queued for requisitioning for [facility]: February 19, 2009 3:15 PM  Tests Sent for requisitioning (February 19, 2009 3:15 PM):     02/19/2009: [facility] -- T-CBC w/Diff [[id number]] (signed)     02/19/2009: [facility] -- T-Comprehensive Metabolic Panel [[id number]] (signed)     02/19/2009: [facility] -- T-HIV Viral Load [phone] (signed)     02/19/2009: [facility] -- T-Lipid Profile [phone] (signed)     02/19/2009: [facility] -- T-RPR (Syphilis) [phone] (signed)

## 2010-02-11 NOTE — Letter (Signed)
Summary: Results Follow-up Letter  [facility]  [address]   [place], [place] [address]   Phone: [phone]  Fax: [phone]           March 27, 2009  [address]  [place], [place]  [address]  Dear Ms. Hollifield,   The following are the results of your recent test(s):  Test     Result     Pap Smear    Normal_XXX___  Not Normal_____       Comments:  I will see you in one year for your next PAP smear.  Thank you for coming for this important annual screening test.  Sincerely,    Jennet Maduro [facility]

## 2010-02-11 NOTE — Assessment & Plan Note (Signed)
Summary: f/u [mkj]   CC:  follow-up visit, lab results, pt. just found out sister passed away today, and c/o headache.  History of Present Illness: patient recently learned that her sister died in [place] and she is upset.  She is not able to go back for the funeral.  I offered her counseling but she thinks that she will be okay.she has been taking her a triple everyday.  She complains of menstrual cramps and would like to get refill on her ibuprofen.  Preventive Screening-Counseling & Management  Alcohol-Tobacco     Alcohol drinks/day: 0     Smoking Status: never  Caffeine-Diet-Exercise     Caffeine use/day: tea     Does Patient Exercise: yes     Type of exercise: walk     Exercise (avg: min/session): <30     Times/week: 5  Safety-Violence-Falls     Seat Belt Use: yes      Sexual History:  no.        Drug Use:  No.        Blood Transfusions:  no.        Travel History:  no.    Comments: pt. declined condoms   Updated Prior Medication List: ATRIPLA 600-200-300 MG TABS (EFAVIRENZ-EMTRICITAB-TENOFOVIR) Take 1 tablet by mouth at bedtime IBUPROFEN 800 MG TABS (IBUPROFEN) Take 1 tablet by mouth every 8 hours as needed  Current Allergies (reviewed today): No known allergies  Review of Systems       The patient complains of headaches.  The patient denies anorexia, fever, and weight loss.    Vital Signs:  Patient profile:   40 year old female Menstrual status:  regular Height:      63 inches (160.02 cm) Weight:      251.4 pounds (114.27 kg) BMI:     44.69 Temp:     98.7 degrees F (37.06 degrees C) oral Pulse rate:   103 / minute BP sitting:   145 / 90  (right arm)  Vitals Entered By: Wendall Mola CMA Duncan Dull) (December 16, 2009 2:58 PM) CC: follow-up visit, lab results, pt. just found out sister passed away today, c/o headache Is Patient Diabetic? No Pain Assessment Patient in pain? no      Nutritional Status BMI of > 30 = obese Nutritional Status  Detail appetite "normal"  Have you ever been in a relationship where you felt threatened, hurt or afraid?No   Does patient need assistance? Functional Status Self care Ambulation Normal Comments no missed doses of meds per pt.   Physical Exam  General:  alert, well-developed, well-nourished, and well-hydrated.   Head:  normocephalic and atraumatic.   Mouth:  pharynx pink and moist.   Lungs:  normal breath sounds.     Impression & Recommendations:  Problem # 1:  HIV DISEASE (ICD-042) Pt.s most recent CD4ct was 540 and VL <20 .  Pt instructed to continue the current antiretroviral regimen.  Pt encouraged to take medication regularly and not miss doses.  Pt will f/u in 3 months for repeat blood work and will see me 2 weeks later. Influenza vaccine given.  Diagnostics Reviewed:  HIV: HIV positive - not AIDS (03/06/2009)   HIV-Western blot: Positive (09/08/2006)   CD4: 540 (12/03/2009)   WBC: 5.9 (12/02/2009)   Hgb: 11.6 (12/02/2009)   HCT: 36.2 (12/02/2009)   Platelets: 365 (12/02/2009) HIV-1 RNA: <20 copies/mL (12/02/2009)   HBSAg: NEG (09/08/2006)  Problem # 2:  MENSTRUAL DISORDER (ICD-626.9) Ibuprofen Rx given.  Other Orders: Est. Patient Level III ([address]) Influenza Vaccine NON MCR (93235) Future Orders: T-CD4SP ([facility]) (CD4SP) ... 06/14/2010 T-HIV Viral Load [phone]) ... 06/14/2010 T-Comprehensive Metabolic Panel [phone]) ... 06/14/2010 T-CBC w/Diff ([id number]) ... 06/14/2010  Patient Instructions: 1)  Please schedule a follow-up appointment in 6 months,  weeks after labs.  Prescriptions: IBUPROFEN 800 MG TABS (IBUPROFEN) Take 1 tablet by mouth every 8 hours as needed  #60 x 5   Entered and Authorized by:   Yisroel Ramming MD   Signed by:   Yisroel Ramming MD on 12/16/2009   Method used:   Print then Give to Patient   RxID:   [id number]      Immunizations Administered:  Influenza Vaccine # 1:    Vaccine Type: Fluvax Non-MCR    Site: left  deltoid    Mfr: Novartis    Dose: 0.5 ml    Route: IM    Given by: Wendall Mola CMA ( AAMA)    Exp. Date: 04/13/2010    Lot #: [id number]    VIS given: 08/06/09 version given December 16, 2009.  Flu Vaccine Consent Questions:    Do you have a history of severe allergic reactions to this vaccine? no    Any prior history of allergic reactions to egg and/or gelatin? no    Do you have a sensitivity to the preservative Thimersol? no    Do you have a past history of Guillan-Barre Syndrome? no    Do you currently have an acute febrile illness? no    Have you ever had a severe reaction to latex? no    Vaccine information given and explained to patient? yes    Are you currently pregnant? no

## 2010-02-11 NOTE — Miscellaneous (Signed)
Summary: clinical update/ryan white  Clinical Lists Changes  Observations: Added new observation of PAYOR: Medicaid (06/19/2009 15:21) Added new observation of #CHILD<18 IN: No (06/19/2009 15:21) Added new observation of FAMILYSIZE: 2  (06/19/2009 15:21) Added new observation of FINASSESSDT: 06/19/2009  (06/19/2009 15:21) Added new observation of RW VITAL STA: Active  (06/19/2009 15:21)

## 2010-02-11 NOTE — Miscellaneous (Signed)
Summary: [facility]: Medical Case Mgt. Referral  [facility]: Medical Case Mgt. Referral   Imported By: Florinda Marker 03/27/2009 14:40:04  _____________________________________________________________________  External Attachment:    Type:   Image     Comment:   External Document

## 2010-02-11 NOTE — Progress Notes (Signed)
Summary: [employer]/pt assist med arrived for Aug  Phone Note Refill Request      Prescriptions: ATRIPLA 600-200-300 MG TABS (EFAVIRENZ-EMTRICITAB-TENOFOVIR) Take 1 tablet by mouth at bedtime  #30 x 0   Entered by:   Paulo Fruit  BS,CPht II,MPH   Authorized by:   Yisroel Ramming MD   Signed by:   Paulo Fruit  BS,CPht II,MPH on 08/14/2009   Method used:   Samples Given   RxID:   [id number]  Patient Assist Medication Verification: Medication name: Atripla RX # [id number] Tech approval:MLD Call placed to patient with message that assistance medications are ready for pick-up. Patient said she will be in on Thursday, 08/15/09 to pick it up. Paulo Fruit  BS,CPht II,MPH  August 14, 2009 3:55 PM

## 2010-02-11 NOTE — Assessment & Plan Note (Signed)
Summary: 2WK F/U/VS   CC:  follow-up visit and pt requests help from [facility].  History of Present Illness: Pt feels well.  She would like a Rx for ensure. She has not missed any doses of her Atripla.  Preventive Screening-Counseling & Management  Alcohol-Tobacco     Alcohol drinks/day: 0     Smoking Status: never  Caffeine-Diet-Exercise     Caffeine use/day: coffee sometimes     Does Patient Exercise: yes     Type of exercise: walk     Exercise (avg: min/session): 30-60     Times/week: 5      Sexual History:  no.        Drug Use:  No.        Blood Transfusions:  no.        Travel History:  no.     Updated Prior Medication List: ATRIPLA 600-200-300 MG TABS (EFAVIRENZ-EMTRICITAB-TENOFOVIR) Take 1 tablet by mouth at bedtime ENSURE  LIQD (NUTRITIONAL SUPPLEMENTS) one can two times a day  Current Allergies: No known allergies  Past History:  Past Medical History: Last updated: 09/29/2006 HIV disease  Social History: Sexual History:  no Travel History:  no Blood Transfusions:  no  Review of Systems  The patient denies anorexia, fever, and weight loss.    Vital Signs:  Patient profile:   40 year old female Height:      63 inches (160.02 cm) Weight:      248.9 pounds (113.14 kg) BMI:     44.25 Temp:     97.3 degrees F (36.28 degrees C) oral Pulse rate:   85 / minute BP sitting:   159 / 99  (left arm)  Vitals Entered By: Starleen Arms CMA (March 06, 2009 10:15 AM) CC: follow-up visit, pt requests help from [facility] Is Patient Diabetic? No Pain Assessment Patient in pain? no      Nutritional Status BMI of > 30 = obese Nutritional Status Detail nl  Does patient need assistance? Functional Status Self care Ambulation Normal   Physical Exam  General:  alert, well-developed, well-nourished, and well-hydrated.   Head:  normocephalic and atraumatic.   Mouth:  pharynx pink and moist.   Lungs:  normal breath sounds.      Impression &  Recommendations:  Problem # 1:  HIV DISEASE (ICD-042) Pt.s most recent CD4ct was 460 and VL<48  .  Pt instructed to continue the current antiretroviral regimen.  Pt encouraged to take medication regularly and not miss doses.  Pt will f/u in 3 months for repeat blood work and will see me 2 weeks later.  Influenza vaccine given today.  Will schedule for a PAP.  Diagnostics Reviewed:  HIV: REACTIVE (09/08/2006)   HIV-Western blot: Positive (09/08/2006)   CD4: 460 (02/20/2009)   WBC: 5.8 (02/19/2009)   Hgb: 12.6 (02/19/2009)   HCT: 37.5 (02/19/2009)   Platelets: 298 (02/19/2009) HIV-1 RNA: <48 copies/mL (02/19/2009)   HBSAg: NEG (09/08/2006)  Orders: Est. Patient Level III (71062)  Medications Added to Medication List This Visit: 1)  Ensure Liqd (Nutritional supplements) .... One can two times a day  Other Orders: Future Orders: T-CD4SP ([facility]) (CD4SP) ... 06/04/2009 T-HIV Viral Load [phone]) ... 06/04/2009 T-Comprehensive Metabolic Panel [phone]) ... 06/04/2009 T-CBC w/Diff ([id number]) ... 06/04/2009  Patient Instructions: 1)  Please schedule a follow-up appointment in 3 months, 2 weeks after labs. 2)  Schedule PAP in PAP clinic.  Prescriptions: ATRIPLA 600-200-300 MG TABS (EFAVIRENZ-EMTRICITAB-TENOFOVIR) Take 1 tablet by mouth at bedtime  #  30 x 5   Entered and Authorized by:   Yisroel Ramming MD   Signed by:   Yisroel Ramming MD on 03/06/2009   Method used:   Print then Give to Patient   RxID:   [id number] ENSURE  LIQD (NUTRITIONAL SUPPLEMENTS) one can two times a day  #2 cases x prn   Entered and Authorized by:   Yisroel Ramming MD   Signed by:   Yisroel Ramming MD on 03/06/2009   Method used:   Print then Give to Patient   RxID:   [id number]  Process Orders Check Orders Results:     [facility]: ABN not required for this insurance Tests Sent for requisitioning (March 07, 2009 10:46 AM):     06/04/2009: [facility] -- T-HIV  Viral Load [phone] (signed)     06/04/2009: [facility] -- T-Comprehensive Metabolic Panel [[id number]] (signed)     06/04/2009: [facility] -- [facility] w/Diff [[id number]] (signed)

## 2010-02-11 NOTE — Assessment & Plan Note (Signed)
Summary: 40month f/u/vs   CC:  follow-up visit, lab results, went to [facility]. yesterday for irregular menses, and requests RX for cramping.  History of Present Illness: Pt here for f/u.  she is having irregular periods and went to [facility] yesterday. Pregnancy test negative.  she has a f/u with Gynecology.  She c/o menstrual cramping.  She has not tried anything for it.  Preventive Screening-Counseling & Management  Alcohol-Tobacco     Alcohol drinks/day: 0     Smoking Status: never  Caffeine-Diet-Exercise     Caffeine use/day: tea     Does Patient Exercise: yes     Type of exercise: walk     Exercise (avg: min/session): <30     Times/week: 5  Safety-Violence-Falls     Seat Belt Use: yes      Sexual History:  no.        Drug Use:  No.        Blood Transfusions:  no.        Travel History:  no.    Comments: pt. declined condoms   Updated Prior Medication List: ATRIPLA 600-200-300 MG TABS (EFAVIRENZ-EMTRICITAB-TENOFOVIR) Take 1 tablet by mouth at bedtime IBUPROFEN 800 MG TABS (IBUPROFEN) Take 1 tablet by mouth every 8 hours as needed  Current Allergies (reviewed today): No known allergies  Past History:  Past Medical History: Last updated: 09/29/2006 HIV disease  Review of Systems  The patient denies anorexia, fever, and weight loss.    Vital Signs:  Patient profile:   40 year old female Menstrual status:  regular Height:      63 inches (160.02 cm) Weight:      248.8 pounds (113.09 kg) BMI:     44.23 Temp:     98.0 degrees F (36.67 degrees C) oral Pulse rate:   75 / minute BP sitting:   132 / 88  (left arm)  Vitals Entered By: Wendall Mola CMA Duncan Dull) (June 19, 2009 2:31 PM) CC: follow-up visit, lab results, went to [facility]. yesterday for irregular menses, requests RX for cramping Is Patient Diabetic? No Pain Assessment Patient in pain? yes     Location: pelvic Intensity: 5 Type: cramping Onset of pain  Intermittent Nutritional Status  BMI of > 30 = obese Nutritional Status Detail appetite "normal"  Does patient need assistance? Functional Status Self care Ambulation Normal Comments No missed doses of Atripla per patient   Physical Exam  General:  alert, well-developed, well-nourished, and well-hydrated.   Head:  normocephalic and atraumatic.   Mouth:  pharynx pink and moist.   Lungs:  normal breath sounds.      Impression & Recommendations:  Problem # 1:  HIV DISEASE (ICD-042) Pt.s most recent CD4ct was 610 and VL <48 .  Pt instructed to continue the current antiretroviral regimen.  Pt encouraged to take medication regularly and not miss doses.  Pt will f/u in 3 months for repeat blood work and will see me 2 weeks later.  Diagnostics Reviewed:  HIV: HIV positive - not AIDS (03/06/2009)   HIV-Western blot: Positive (09/08/2006)   CD4: 610 (06/06/2009)   WBC: 5.3 (06/05/2009)   Hgb: 11.4 (06/05/2009)   HCT: 36.1 (06/05/2009)   Platelets: 304 (06/05/2009) HIV-1 RNA: <48 copies/mL (06/05/2009)   HBSAg: NEG (09/08/2006)  Medications Added to Medication List This Visit: 1)  Ibuprofen 800 Mg Tabs (Ibuprofen) .... Take 1 tablet by mouth every 8 hours as needed  Other Orders: Est. Patient Level III (66440) Future  Orders: T-CD4SP ([facility]) (CD4SP) ... 09/17/2009 T-HIV Viral Load [id number]) ... 09/17/2009 T-Comprehensive Metabolic Panel [id number]) ... 09/17/2009 T-CBC w/Diff ([id number]) ... 09/17/2009  Patient Instructions: 1)  Please schedule a follow-up appointment in 3 months, 2 weeks after labs.  Prescriptions: IBUPROFEN 800 MG TABS (IBUPROFEN) Take 1 tablet by mouth every 8 hours as needed  #60 x 5   Entered and Authorized by:   Yisroel Ramming MD   Signed by:   Yisroel Ramming MD on 06/19/2009   Method used:   Print then Give to Patient   RxID:   [id number] ATRIPLA 600-200-300 MG TABS (EFAVIRENZ-EMTRICITAB-TENOFOVIR) Take 1 tablet by mouth at bedtime  #30 x 5   Entered and Authorized by:    Yisroel Ramming MD   Signed by:   Yisroel Ramming MD on 06/19/2009   Method used:   Print then Give to Patient   RxID:   [id number]

## 2010-02-13 ENCOUNTER — Telehealth (INDEPENDENT_AMBULATORY_CARE_PROVIDER_SITE_OTHER): Payer: Self-pay | Admitting: *Deleted

## 2010-02-19 NOTE — Progress Notes (Signed)
Summary: ADAP submitted  Phone Note Outgoing Call   Action Taken: Phone Call Completed Reason for Call: Get patient information Summary of Call: Lonell Face completed and submitted ADAP application to [facility] on 02/11/2010

## 2010-03-31 LAB — GC/CHLAMYDIA PROBE AMP, GENITAL: Chlamydia, DNA Probe: NEGATIVE

## 2010-03-31 LAB — URINALYSIS, ROUTINE W REFLEX MICROSCOPIC
Glucose, UA: NEGATIVE mg/dL
Ketones, ur: NEGATIVE mg/dL
Leukocytes, UA: NEGATIVE
pH: 5.5 (ref 5.0–8.0)

## 2010-03-31 LAB — DIFFERENTIAL
Basophils Relative: 0 % (ref 0–1)
Eosinophils Absolute: 0.1 10*3/uL (ref 0.0–0.7)
Lymphs Abs: 2.3 10*3/uL (ref 0.7–4.0)
Neutro Abs: 2.9 10*3/uL (ref 1.7–7.7)
Neutrophils Relative %: 50 % (ref 43–77)

## 2010-03-31 LAB — URINE MICROSCOPIC-ADD ON

## 2010-03-31 LAB — CBC
MCHC: 33.7 g/dL (ref 30.0–36.0)
MCV: 89.5 fL (ref 78.0–100.0)
Platelets: 269 10*3/uL (ref 150–400)
WBC: 5.8 10*3/uL (ref 4.0–10.5)

## 2010-03-31 LAB — WET PREP, GENITAL

## 2010-03-31 LAB — T-HELPER CELL (CD4) - (RCID CLINIC ONLY): CD4 T Cell Abs: 610 uL (ref 400–2700)

## 2010-04-02 LAB — T-HELPER CELL (CD4) - (RCID CLINIC ONLY)
CD4 % Helper T Cell: 21 % — ABNORMAL LOW (ref 33–55)
CD4 T Cell Abs: 460 uL (ref 400–2700)

## 2010-04-22 LAB — T-HELPER CELL (CD4) - (RCID CLINIC ONLY): CD4 T Cell Abs: 450 uL (ref 400–2700)

## 2010-04-28 LAB — CBC
HCT: 37.8 % (ref 36.0–46.0)
Hemoglobin: 12.6 g/dL (ref 12.0–15.0)
MCHC: 33.5 g/dL (ref 30.0–36.0)
MCV: 88.1 fL (ref 78.0–100.0)
Platelets: 328 10*3/uL (ref 150–400)
RDW: 13.4 % (ref 11.5–15.5)

## 2010-04-28 LAB — DIFFERENTIAL
Basophils Absolute: 0 10*3/uL (ref 0.0–0.1)
Basophils Relative: 0 % (ref 0–1)
Eosinophils Absolute: 0.1 10*3/uL (ref 0.0–0.7)
Eosinophils Relative: 3 % (ref 0–5)
Lymphocytes Relative: 38 % (ref 12–46)
Monocytes Absolute: 0.3 10*3/uL (ref 0.1–1.0)

## 2010-04-28 LAB — T-HELPER CELL (CD4) - (RCID CLINIC ONLY): CD4 % Helper T Cell: 20 % — ABNORMAL LOW (ref 33–55)

## 2010-05-06 ENCOUNTER — Other Ambulatory Visit (INDEPENDENT_AMBULATORY_CARE_PROVIDER_SITE_OTHER): Payer: Self-pay

## 2010-05-06 DIAGNOSIS — B20 Human immunodeficiency virus [HIV] disease: Secondary | ICD-10-CM

## 2010-05-07 LAB — CBC WITH DIFFERENTIAL/PLATELET
Basophils Relative: 0 % (ref 0–1)
Eosinophils Absolute: 0.3 10*3/uL (ref 0.0–0.7)
Eosinophils Relative: 5 % (ref 0–5)
Lymphs Abs: 1.8 10*3/uL (ref 0.7–4.0)
MCH: 28.4 pg (ref 26.0–34.0)
MCHC: 30.9 g/dL (ref 30.0–36.0)
MCV: 91.9 fL (ref 78.0–100.0)
Neutrophils Relative %: 58 % (ref 43–77)
Platelets: 352 10*3/uL (ref 150–400)
RBC: 3.94 MIL/uL (ref 3.87–5.11)
RDW: 13.4 % (ref 11.5–15.5)

## 2010-05-07 LAB — COMPLETE METABOLIC PANEL WITH GFR
ALT: 16 U/L (ref 0–35)
Alkaline Phosphatase: 72 U/L (ref 39–117)
CO2: 27 mEq/L (ref 19–32)
Creat: 0.68 mg/dL (ref 0.40–1.20)
GFR, Est African American: >60 mL/min (ref >60)
GFR, Est Non African American: >60 mL/min (ref >60)
Sodium: 142 mEq/L (ref 135–145)
Total Bilirubin: 0.3 mg/dL (ref 0.3–1.2)
Total Protein: 7 g/dL (ref 6.0–8.3)

## 2010-05-07 LAB — T-HELPER CELL (CD4) - (RCID CLINIC ONLY): CD4 % Helper T Cell: 22 % — ABNORMAL LOW (ref 33–55)

## 2010-05-08 LAB — HIV-1 RNA QUANT-NO REFLEX-BLD: HIV 1 RNA Quant: <20 copies/mL (ref <20)

## 2010-05-20 ENCOUNTER — Encounter: Payer: Self-pay | Admitting: Infectious Diseases

## 2010-05-20 ENCOUNTER — Ambulatory Visit (INDEPENDENT_AMBULATORY_CARE_PROVIDER_SITE_OTHER): Payer: Self-pay | Admitting: Infectious Diseases

## 2010-05-20 DIAGNOSIS — B20 Human immunodeficiency virus [HIV] disease: Secondary | ICD-10-CM

## 2010-05-20 DIAGNOSIS — N926 Irregular menstruation, unspecified: Secondary | ICD-10-CM

## 2010-05-20 DIAGNOSIS — B0229 Other postherpetic nervous system involvement: Secondary | ICD-10-CM | POA: Insufficient documentation

## 2010-05-20 DIAGNOSIS — N939 Abnormal uterine and vaginal bleeding, unspecified: Secondary | ICD-10-CM

## 2010-05-20 DIAGNOSIS — Z79899 Other long term (current) drug therapy: Secondary | ICD-10-CM

## 2010-05-20 DIAGNOSIS — Z113 Encounter for screening for infections with a predominantly sexual mode of transmission: Secondary | ICD-10-CM

## 2010-05-20 NOTE — Assessment & Plan Note (Signed)
Will refer her to GYN. No bleeding for 2 months. As well has hx of pain.

## 2010-05-20 NOTE — Assessment & Plan Note (Signed)
Rash in her L popliteal fossa seems most consistent with this. Will give her a trial of elavil.

## 2010-05-20 NOTE — Progress Notes (Signed)
  Subjective:    Patient ID: Katrina Parker, female    DOB: 1970-10-25, 40 y.o.   MRN: [id number]  HPI 40 yo [place] F who was Dx with HIV+ 2008. She is currently taking atripla, CD4 400 VL<20 (05-06-10). Today complains of itching on her L leg since January. Denies having blisters here. Denies hx of trauma. Has not had period for the last 2 months. occas abd/pelvic pain. Mostly now has pain in her hips.    Review of Systems     Objective:   Physical Exam  Constitutional: She appears well-developed and well-nourished.  Eyes: EOM are normal. Pupils are equal, round, and reactive to light.  Neck: Neck supple.  Cardiovascular: Normal rate, regular rhythm and normal heart sounds.   Pulmonary/Chest: Effort normal and breath sounds normal. No respiratory distress.  Abdominal: Soft. Bowel sounds are normal. She exhibits no distension.  Skin:             Assessment & Plan:

## 2010-05-20 NOTE — Assessment & Plan Note (Signed)
She appears to be doing well. Will continue her on her current rx/atripla. She is given condoms. States she is not sexually active. Will see her back in 4-5 moths with labs prior.

## 2010-05-21 ENCOUNTER — Telehealth: Payer: Self-pay | Admitting: *Deleted

## 2010-05-21 NOTE — Telephone Encounter (Signed)
Called patient to notify of [facility] Appt. For 06/26/10 at 3:00 pm Wendall Mola CMA

## 2010-05-22 ENCOUNTER — Ambulatory Visit: Payer: Self-pay

## 2010-05-27 NOTE — Discharge Summary (Signed)
NAMERANDIE, BLOODGOOD NO.:  [id number]   MEDICAL RECORD NO.:  [id number]          PATIENT TYPE:  OIB   LOCATION:  1612                         FACILITY:  [facility]   PHYSICIAN:  Lennie Muckle, MD      DATE OF BIRTH:  10/21/1970   DATE OF ADMISSION:  11/01/2006  DATE OF DISCHARGE:  11/02/2006                               DISCHARGE SUMMARY   HOSPITAL COURSE:  Ms. Katrina Parker is a 41 year old female who had laparoscopic  incisional hernia repair on November 01, 2006.  She had inadequate pain  control with oral agent.  Postoperatively was kept overnight for IV pain  medication, in addition to oral medications.   DISCHARGE INSTRUCTIONS:  1. She was instructed to take two Percocet q.4h. upon her discharge      for 24 hours.  2. An abdominal binder for aid with discomfort p.r.n.  3. She can start showering on Wednesday.  4. No heavy lifting of greater than 20 pounds for six weeks.   FOLLOWUP:  She will follow up with the clinic in approximately three  weeks' time.   CONDITION ON DISCHARGE:  Upon her discharge her abdominal incisions are  without evidence of an infection.  No bleeding.  The Steri-Strips are in  place.      Lennie Muckle, MD  Electronically Signed     ALA/MEDQ  D:  11/02/2006  T:  11/02/2006  Job:  [id number]

## 2010-05-27 NOTE — Group Therapy Note (Signed)
NAMEMALY, LEMARR NO.:  [id number]   MEDICAL RECORD NO.:  [id number]          PATIENT TYPE:  WOC   LOCATION:  [facility]                   FACILITY:  [facility]   PHYSICIAN:  Argentina Donovan, MD        DATE OF BIRTH:  [birth year]   DATE OF SERVICE:                                  CLINIC NOTE   CHIEF COMPLAINT:  Desire to get pregnant.   HISTORY OF PRESENT ILLNESS:  This is a 40 year old female who is HIV  positive.  She was referred here to outpatient clinics because they felt  like she would need artificial insemination to become pregnant due to  the risk of transmission of HIV.  We, at this point, will not discuss  infertility options as the patient has not gone through the proper  channels for payment.  Therefore, we discussed general, routine health  matters.  The patient states that her periods cause cramping but do not  cause heavy bleeding.  She gets them sometimes two times a month.  For  example, at the first and the end of the month.  This has been going on  since her last child, which was 16 years ago.  There have been no  changes in her period during this time.  The patient does not have any  concerns about her periods.  She is currently using condoms for  prevention of HIV transmission as well as for contraception.  The  patient has never had a Pap smear.   PAST MEDICAL HISTORY:  The patient denies actual health problems  although notes that her blood pressure is elevated today and she says  she was told she has high sugar in her blood, but she said this went  away.   GYN HISTORY:  She is a G3, P3 with her LMP 01/09/07.   SURGEON:  She had hernia surgery this past October.   FAMILY HISTORY:  None significant.  No cancers.   SOCIAL HISTORY:  She does not smoke.  Denies alcohol.  Her husband was  killed somewhere in [place].  She has a female friend now who she is  sexually active with and wants to have a baby with.  This partner does  not know her HIV  status and she does not want him to know.   PHYSICAL EXAMINATION:  VITALS:  Temperature:  97.7.  Pulse:  90.  Blood  pressure:  151/91.  Weight:  225.6 kg.  GENERAL:  No acute distress.  ABDOMEN:  Soft, nontender.  EXTREMITIES:  Trace edema.  GENITOURINARY:  This is the patient's first pelvic exam in her entire  life.  She is not relaxed initially.  Her cervix is visualized and there  are some concerning lesions at about one o'clock and seven o'clock.  These lesions are whitish in nature and irregular appearing.  Otherwise  no lesions noted.  No masses noted on bimanual exam.   LABS:  None.   ASSESSMENT/PLAN:  [age]-year-old HIV positive female who came  here for the desire of getting pregnant.  1. Desire of pregnancy:  We did not  discuss infertility at all during      this exam except to say that it will not be discussed any further      until the patient undergoes the correct procedures for payment.      What we also did was gave her the number of the infertility person      we refer to as this may be a better route.  The patient has many      risk factors and will likely need artificial insemination for the      pregnancy to prevent the transmission of HIV.  The interview was      conducted in [place] as the patient does speak [place].  However,      there were some communication difficulties due to just the general      subject of what we were talking about.  The patient hopefully will      follow up with a fertility specialist and coordinate this with her      ID specialist at the outpatient clinics.  ID specialist will start      antiretrovirals if she does get pregnant.  2. HIV:  The patient sees outpatient clinics.  She is not on any      medication at this time.  They likely will start retrovirals soon.      Patient questioned whether she actually has HIV and I showed her      the labs that were drawn at the outpatient clinics.  Again      reiterated either safe sex  or no sex at all to prevent      transmission.  Also reiterated the fact that she should not get      pregnant by the normal means with a man due to HIV transmission      risk.  3. Preventive medicine:  We obtained a Pap smear.  This is the      patient's first Pap smear ever.  There were some suspicious lesions      on her cervix which could be precancerous or cancer.  However, this      could also be just physiological changes.      Johney Maine, MD    ______________________________  Argentina Donovan, MD    JT/MEDQ  D:  01/26/2007  T:  01/26/2007  Job:  [id number]   cc:   [facility]

## 2010-05-27 NOTE — Op Note (Signed)
Katrina Parker, Katrina Parker NO.:  [id number]   MEDICAL RECORD NO.:  [id number]          PATIENT TYPE:  OIB   LOCATION:  1612                         FACILITY:  [facility]   PHYSICIAN:  Lennie Muckle, MD      DATE OF BIRTH:  02/03/1970   DATE OF PROCEDURE:  11/01/2006  DATE OF DISCHARGE:                               OPERATIVE REPORT   PREOPERATIVE DIAGNOSIS:  Incisional hernia.   POSTOPERATIVE DIAGNOSIS:  Incisional hernia.   PROCEDURE:  Laparoscopic incisional hernia repair with mesh.   SURGEON:  Lennie Muckle, M.D.   ASSISTANT:  Ollen Gross. Vernell Morgans, M.D.   ANESTHESIA:  General endotracheal.   ESTIMATED BLOOD LOSS:  Minimal.   SPECIMENS:  None.   FINDINGS:  Three small fascial defects totaling approximately 8 cm in  size - placed 20 x 15 Parietex mesh.   INDICATIONS FOR PROCEDURE:  The patient is a 40 year old female who was  having abdominal pain and was found to have an incisional hernia.  CT  did reveal small bowel loops within the hernia.  Informed consent was  obtained prior to the procedure.   DESCRIPTION OF PROCEDURE:  The patient was identified in the  preoperative holding suite.  She did receive IV antibiotics of 2 grams  of Kefzol preoperatively.  Surgical site was marked in the holding room.  She was then taken to the operating room and placed in the supine  position.  After administration of general endotracheal anesthesia,  Foley catheter was placed.  Her abdomen was then prepped and draped in  the usual sterile fashion. A time-out was performed indicating the  patient and the procedure. Using the Optiview 5 mm trocar was placed at  the left costochondral margin and under visualization, the abdominal  cavity was successfully entered.  Pneumoperitoneum was able to be  established. There were omental adhesion attachments noted at the  umbilical hernia site.  A #10 mm trocar was placed approximately 5 cm  distal to the 5 mm trocar and one was placed at  the inferior portion of  the abdominal wall.  Both were placed under visualization with the  camera.  Using the laparoscopic scissors, the omental attachments were  taken down from the hernia site using electrocautery and blunt  dissection.  There appeared to be no small bowel within the hernia sac.  After ligating the omental attachment, the hernia sac was marked  inferiorly and superiorly with spinal needles.  The total defect size  was measured approximately 8 cm.  Three small fascial defects were noted  in the vicinity.  At that time, a 20 x 15 Parietex mesh was chosen for a  4 cm overlap.  A 1.0 Prolene suture was placed on all four quadrants of  the mesh.  It was then secured to the abdominal wall using the suture  passer.  The Protac device was then placed to secure the mesh to the  abdominal wall.  Eight additional securements to the abdominal wall were  placed with Gore-Tex suture using the suture passer.  The mesh  appeared  to be in good position and was slightly taught around the hernia site.  There was  minimal amount of bleeding during the case.  The fascial defect where  the 10 mm trocar was closed with 0 Vicryl suture.  The skin was closed  with 4-0 Monocryl.  Steri-Strips were placed for final dressing.  The  patient was then placed in abdominal binder.  She was extubated and  transported to the postanesthesia care unit in stable condition.      Lennie Muckle, MD  Electronically Signed     ALA/MEDQ  D:  11/01/2006  T:  11/01/2006  Job:  [id number]

## 2010-05-29 ENCOUNTER — Encounter: Payer: Self-pay | Admitting: Infectious Diseases

## 2010-06-26 ENCOUNTER — Other Ambulatory Visit: Payer: Self-pay | Admitting: Physician Assistant

## 2010-06-26 ENCOUNTER — Encounter: Payer: Self-pay | Admitting: Physician Assistant

## 2010-06-26 DIAGNOSIS — N939 Abnormal uterine and vaginal bleeding, unspecified: Secondary | ICD-10-CM

## 2010-06-26 DIAGNOSIS — N949 Unspecified condition associated with female genital organs and menstrual cycle: Secondary | ICD-10-CM

## 2010-06-26 DIAGNOSIS — N926 Irregular menstruation, unspecified: Secondary | ICD-10-CM

## 2010-06-26 LAB — POCT PREGNANCY, URINE: Preg Test, Ur: NEGATIVE

## 2010-06-27 NOTE — Group Therapy Note (Unsigned)
NAMEMADISSON, Parker NO.:  [id number]  MEDICAL RECORD NO.:  [id number]           PATIENT TYPE:  A  [facility]ATION:  [facility]                   FACILITY:  [facility]  PHYSICIAN:  Maylon Cos, CNM    DATE OF BIRTH:  [birth year]  DATE OF SERVICE:  06/26/2010                                 CLINIC NOTE  REASON FOR VISIT:  She is a referral from Infectious Disease secondary to no menstrual cycles for 2 months and occasional pelvic pain.  HISTORY OF PRESENT ILLNESS:  The patient is a 40 year old gravida 3, para 3-0-0-3 who was under the care of Dr. Johny Sax at Infectious Disease for known HIV infection.  She states that approximately her cycles have been normal coming once a month and lasting for 4 days up until 6 months ago.  In January, she had a very short cycles that lasted 2 days, no period in February, no period in March, no period in April, and then had 2 days of bleeding on May 14 and May 27, 2010.  The bleeding was described as light spotting with pain that was relieved by the use of naproxen.  She is currently in the same sex relationship and has not had intercourse with a female partner in some time.  However, urine pregnancy test was performed today and was found to be negative. She denies any new medications, weight gain or weight loss.  PHYSICAL EXAMINATION:  GENERAL:  Katrina Parker is a pleasant 40 year old African female who appears to be older than her stated age of 29. VITAL SIGNS:  Her temperature is 97, her pulse is 91, her blood pressure is 176/99, and her weight is 109.2.  Her height is 62 inches. HEENT:  Grossly normal with poor dentition. ABDOMEN:  Soft and nontender.  She does have multiple scarring, 4 puncture sites one in each quadrant, and a vertical scar from above the umbilicus down to the symphysis.  She reports that this is from an abdominal surgery that was performed in [place], however, she is uncertain as to why the surgery was performed which  did not clear bleeding out of her abdomen and if any organs were removed. GENITOURINARY:  Bimanual exam reveals a no cervical motion tenderness, slightly enlarged and irregularly shaped uterus that is nontender to palpation.  Adnexa are nonenlarged and nontender bilaterally. Examination is limited by the patient's habitus. EXTREMITIES:  Equal and reactive x4 with trace amount of edema noted in the lower extremities.  ASSESSMENT: 1. Abnormal uterine bleeding. 2. Known human immunodeficiency virus infection. 3. Hypertension, currently untreated.  PLAN: 1. We will evaluate the patient for perimenopausal state with labs     today and also obtain a pelvic ultrasound as possible causes for     her abnormal bleeding. 2. The patient should continue all previously prescribed medications     by Infectious Disease and continue all followups as scheduled. 3. The patient is instructed to follow up with her primary care doctor     concerning her hypertension today and possible need for meds in the     future.  FOLLOWUP:  The patient should follow  up in 2-3 weeks after her ultrasound to review the results and discuss their plan or sooner if problems should develop before this.          ______________________________ Maylon Cos, CNM    SS/MEDQ  D:  06/26/2010  T:  06/27/2010  Job:  [id number]

## 2010-07-01 ENCOUNTER — Ambulatory Visit (HOSPITAL_COMMUNITY)
Admission: RE | Admit: 2010-07-01 | Discharge: 2010-07-01 | Disposition: A | Discharge status: Home / Self Care | Payer: Self-pay | Source: Ambulatory Visit | Attending: Physician Assistant | Admitting: Physician Assistant

## 2010-07-01 DIAGNOSIS — N938 Other specified abnormal uterine and vaginal bleeding: Secondary | ICD-10-CM | POA: Insufficient documentation

## 2010-07-01 DIAGNOSIS — N949 Unspecified condition associated with female genital organs and menstrual cycle: Secondary | ICD-10-CM

## 2010-07-01 DIAGNOSIS — D252 Subserosal leiomyoma of uterus: Secondary | ICD-10-CM | POA: Insufficient documentation

## 2010-07-01 DIAGNOSIS — N939 Abnormal uterine and vaginal bleeding, unspecified: Secondary | ICD-10-CM

## 2010-07-10 ENCOUNTER — Other Ambulatory Visit: Payer: Self-pay | Admitting: *Deleted

## 2010-07-10 DIAGNOSIS — R52 Pain, unspecified: Secondary | ICD-10-CM

## 2010-07-10 NOTE — Telephone Encounter (Signed)
States she does not have a pharmacy. Wants it mailed to her house. I called [facility] to find out if they have sent this before & about the elavil. She does not have the bottle & does not remember the dose. States it helped but she is out & wants more [facility] only send the ADAP med. Has not sent the other meds I asked her about. Called pt back. She does not know of any pharmacy near her & cannot remember which one she has used in past. Unable to name any pharmacy. She asked that I speak with her case manager & she will tell me. Told her the case manager had come to me & asked that I find out from her. Told her I will speak with case manager again

## 2010-07-10 NOTE — Telephone Encounter (Signed)
Unable to reach pt by phone . Need to know which pharmacy she uses. She wants refill on ibu & eleavil. Elavil not on med list. Will ask pharmacist about it

## 2010-07-10 NOTE — Telephone Encounter (Signed)
Still unable to name a pharmacy. States she will go to a pharmacy & call me from there

## 2010-07-10 NOTE — Telephone Encounter (Signed)
Pt also wants refill on elavil that was given to her during a visit. The pharmacist has record of this. Will forward to md to enter into med list so it can be refilled if desired.

## 2010-07-11 ENCOUNTER — Ambulatory Visit: Payer: Self-pay | Admitting: Obstetrics & Gynecology

## 2010-07-11 DIAGNOSIS — B20 Human immunodeficiency virus [HIV] disease: Secondary | ICD-10-CM

## 2010-07-11 DIAGNOSIS — N939 Abnormal uterine and vaginal bleeding, unspecified: Secondary | ICD-10-CM

## 2010-07-11 DIAGNOSIS — N926 Irregular menstruation, unspecified: Secondary | ICD-10-CM

## 2010-07-11 DIAGNOSIS — I10 Essential (primary) hypertension: Secondary | ICD-10-CM

## 2010-07-11 MED ORDER — IBUPROFEN 800 MG PO TABS: 800.0000 mg | ORAL_TABLET | Freq: Three times a day (TID) | ORAL | Status: DC | PRN

## 2010-07-12 NOTE — Group Therapy Note (Signed)
Katrina Parker, Katrina Parker                  ACCOUNT NO.:  [id number]  MEDICAL RECORD NO.:  [id number]           PATIENT TYPE:  A  LOCATION:  [facility]                   FACILITY:  [facility]  PHYSICIAN:  Maylon Cos, CNM    DATE OF BIRTH:  December 05, 1970  DATE OF SERVICE:  07/11/2010                                 CLINIC NOTE  The patient presents to the GYN Clinic at [facility]  Reason for today's visit is followup for results.  HISTORY OF PRESENT ILLNESS:  The patient is a 40 year old gravida 3, para 3-0-0-3, who is under the care of Infectious Disease for HIV infection.  She was seen by me approximately 3 weeks ago with complaints of abnormal uterine bleeding.  She does have known fibroids, and at that time was requesting hysterectomy.  As we discussed, her bleeding is most likely related to perimenopausal state as she is skipping periods and they have been very light.  She does still not having any clots or heavy periods.  Pain that she has when she does have menses is relieved by the use of one naproxen.  At her last visit, TSH, FSH, LH, and prolactin were drawn, those were all within normal limits; and she also had a pelvic ultrasound.  Pelvic ultrasound was performed on July 01, 2010, and found a fibroid uterus with a focal fibroid measuring 3.7 x 2.8 x 3.3.  This fibroid was previously seen in 2009 and is measuring smaller than it was at that time.  Previous exams showed that the fibroid was measuring 4 x 3.5.  Left and right ovaries are normal and no free fluid was seen.  These results were shared with the patient.  On examination, Katrina Parker is a pleasant African female who appears to be her stated age of 1 and no apparent distress.  HEENT exam is grossly normal.  Her vital signs are as follows.  Temperature is 97.4, pulse is 86, her blood pressure is 182/107, her weight is 244.9.  She is appropriate and alert throughout the course of our discussion.  ASSESSMENT: 1. Abnormal uterine  bleeding likely related to perimenopausal state. 2. Human immunodeficiency virus infection. 3. Hypertension.  PLAN: 1. I have discussed today that given that her bleeding and pain are     minimal and well controlled with oral NSAIDs that she is not a     surgical candidate and a hysterectomy is not necessary at this     time.  Likely the abnormal bleeding and sporadic periods are coming     from a perimenopausal state. 2. The patient was previously informed to follow up with her primary     care doctor which she has not given the state of her blood pressure     being increased over her last visit.  I am going to start her on     lisinopril/hydrochlorothiazide combo 10/12.5 mg one p.o. daily.  I     have also instructed her to follow up with her primary care doctor     in 1 week for recheck and to follow her hypertension.  The patient  understands that she cannot be followed for hypertension in our     clinic; however, we are initiating meds given the severity of her     pressures today. 3. The patient should follow up with Infectious Disease and continue     all of her Infectious Disease meds as     prescribed.  Followup in our clinic should be in March 2013 for an     annual exam and Pap smear or sooner if problems present.  The     patient verbalizes understanding and is agreement with plan.          ______________________________ Maylon Cos, CNM    SS/MEDQ  D:  07/11/2010  T:  07/12/2010  Job:  [id number]

## 2010-07-14 ENCOUNTER — Other Ambulatory Visit: Payer: Self-pay | Admitting: *Deleted

## 2010-08-21 ENCOUNTER — Other Ambulatory Visit: Payer: Self-pay | Admitting: Licensed Clinical Social Worker

## 2010-08-21 DIAGNOSIS — R52 Pain, unspecified: Secondary | ICD-10-CM

## 2010-08-21 MED ORDER — IBUPROFEN 800 MG PO TABS: 800.0000 mg | ORAL_TABLET | Freq: Three times a day (TID) | ORAL | Status: DC | PRN

## 2010-10-10 LAB — T-HELPER CELL (CD4) - (RCID CLINIC ONLY): CD4 T Cell Abs: 300 — ABNORMAL LOW

## 2010-10-13 LAB — T-HELPER CELL (CD4) - (RCID CLINIC ONLY): CD4 T Cell Abs: 290 — ABNORMAL LOW

## 2010-10-20 ENCOUNTER — Other Ambulatory Visit: Payer: Self-pay

## 2010-10-21 ENCOUNTER — Other Ambulatory Visit: Payer: Self-pay

## 2010-10-21 ENCOUNTER — Other Ambulatory Visit: Payer: Self-pay | Admitting: Infectious Diseases

## 2010-10-21 ENCOUNTER — Other Ambulatory Visit (INDEPENDENT_AMBULATORY_CARE_PROVIDER_SITE_OTHER): Payer: Self-pay

## 2010-10-21 DIAGNOSIS — B20 Human immunodeficiency virus [HIV] disease: Secondary | ICD-10-CM

## 2010-10-21 LAB — CBC WITH DIFFERENTIAL/PLATELET
HCT: 33.4 % — ABNORMAL LOW (ref 36.0–46.0)
Hemoglobin: 10.6 g/dL — ABNORMAL LOW (ref 12.0–15.0)
MCH: 28.1 pg (ref 26.0–34.0)
MCHC: 31.7 g/dL (ref 30.0–36.0)
Monocytes Relative: 7 % (ref 3–12)
RDW: 13.6 % (ref 11.5–15.5)

## 2010-10-21 LAB — COMPLETE METABOLIC PANEL WITH GFR
ALT: 18 U/L (ref 0–35)
AST: 20 U/L (ref 0–37)
CO2: 26 mEq/L (ref 19–32)
Calcium: 8.9 mg/dL (ref 8.4–10.5)
Chloride: 106 mEq/L (ref 96–112)
GFR, Est African American: >60 mL/min (ref >60)
Potassium: 4 mEq/L (ref 3.5–5.3)
Sodium: 140 mEq/L (ref 135–145)
Total Protein: 7 g/dL (ref 6.0–8.3)

## 2010-10-22 LAB — T-HELPER CELL (CD4) - (RCID CLINIC ONLY)
CD4 % Helper T Cell: 24 % — ABNORMAL LOW (ref 33–55)
CD4 T Cell Abs: 410 uL (ref 400–2700)

## 2010-10-23 LAB — PREGNANCY, URINE: Preg Test, Ur: NEGATIVE

## 2010-10-23 LAB — URINALYSIS, ROUTINE W REFLEX MICROSCOPIC
Nitrite: NEGATIVE
Protein, ur: 100 — AB

## 2010-10-23 LAB — BASIC METABOLIC PANEL
CO2: 26
GFR calc non Af Amer: >60
Glucose, Bld: 87
Potassium: 4
Sodium: 139

## 2010-10-23 LAB — HEMOGLOBIN AND HEMATOCRIT, BLOOD: Hemoglobin: 12.5

## 2010-10-24 LAB — BASIC METABOLIC PANEL
BUN: 8
Chloride: 106
Potassium: 3.6
Sodium: 138

## 2010-10-24 LAB — DIFFERENTIAL
Eosinophils Absolute: 0.1
Eosinophils Relative: 2
Lymphs Abs: 2.4
Monocytes Absolute: 0.4
Monocytes Relative: 8

## 2010-10-24 LAB — CBC
HCT: 35.4 — ABNORMAL LOW
Hemoglobin: 12.3
MCV: 82
Platelets: 208
WBC: 5.7

## 2010-10-24 LAB — URINALYSIS, ROUTINE W REFLEX MICROSCOPIC
Bilirubin Urine: NEGATIVE
Glucose, UA: NEGATIVE
Hgb urine dipstick: NEGATIVE
Specific Gravity, Urine: 1.023
pH: 6.5

## 2010-10-24 LAB — T-HELPER CELL (CD4) - (RCID CLINIC ONLY)
CD4 % Helper T Cell: 16 — ABNORMAL LOW
CD4 T Cell Abs: 270 — ABNORMAL LOW

## 2010-10-24 LAB — LIPASE, BLOOD: Lipase: 20

## 2010-11-05 ENCOUNTER — Ambulatory Visit: Payer: Self-pay | Admitting: Infectious Diseases

## 2010-11-12 ENCOUNTER — Ambulatory Visit (INDEPENDENT_AMBULATORY_CARE_PROVIDER_SITE_OTHER): Payer: Self-pay | Admitting: Infectious Diseases

## 2010-11-12 ENCOUNTER — Encounter: Payer: Self-pay | Admitting: Infectious Diseases

## 2010-11-12 VITALS — BP 128/76 | HR 84 | Temp 98.4°F | Ht 64.0 in | Wt 246.0 lb

## 2010-11-12 DIAGNOSIS — Z113 Encounter for screening for infections with a predominantly sexual mode of transmission: Secondary | ICD-10-CM

## 2010-11-12 DIAGNOSIS — Z79899 Other long term (current) drug therapy: Secondary | ICD-10-CM

## 2010-11-12 DIAGNOSIS — B20 Human immunodeficiency virus [HIV] disease: Secondary | ICD-10-CM

## 2010-11-12 DIAGNOSIS — M19039 Primary osteoarthritis, unspecified wrist: Secondary | ICD-10-CM

## 2010-11-12 DIAGNOSIS — M19031 Primary osteoarthritis, right wrist: Secondary | ICD-10-CM

## 2010-11-12 DIAGNOSIS — R52 Pain, unspecified: Secondary | ICD-10-CM

## 2010-11-12 DIAGNOSIS — Z23 Encounter for immunization: Secondary | ICD-10-CM

## 2010-11-12 LAB — RPR

## 2010-11-12 LAB — CBC WITH DIFFERENTIAL/PLATELET
Basophils Absolute: 0 10*3/uL (ref 0.0–0.1)
HCT: 34 % — ABNORMAL LOW (ref 36.0–46.0)
Hemoglobin: 10.8 g/dL — ABNORMAL LOW (ref 12.0–15.0)
Lymphocytes Relative: 34 % (ref 12–46)
Monocytes Absolute: 0.4 10*3/uL (ref 0.1–1.0)
Monocytes Relative: 8 % (ref 3–12)
Neutro Abs: 2.8 10*3/uL (ref 1.7–7.7)
Neutrophils Relative %: 54 % (ref 43–77)
WBC: 5.2 10*3/uL (ref 4.0–10.5)

## 2010-11-12 LAB — URIC ACID: Uric Acid, Serum: 4 mg/dL (ref 2.4–7.0)

## 2010-11-12 LAB — SEDIMENTATION RATE: Sed Rate: 21 mm/hr (ref 0–22)

## 2010-11-12 MED ORDER — IBUPROFEN 800 MG PO TABS: 800.0000 mg | ORAL_TABLET | Freq: Three times a day (TID) | ORAL | Status: DC | PRN

## 2010-11-12 NOTE — Assessment & Plan Note (Signed)
She is doing well. Will continue her current meds. She is given condoms, vaccines up to date. will see her back in 4-5 months.

## 2010-11-12 NOTE — Assessment & Plan Note (Signed)
Etiology unclear. Will have her seen by ortho if plain films, serologies negative.

## 2010-11-12 NOTE — Progress Notes (Signed)
  Subjective:    Patient ID: Katrina Parker, female    DOB: 02/08/1970, 40 y.o.   MRN: [id number]  HPI 40 yo [place] F who was Dx with HIV+ 2008. She is currently taking atripla, CD4 410 VL<20 (10-21-10).  Today c/o swelling in her R wrist for the last 2 weeks. Painful. Has limitation of motion. No injury, no hx of heavy lifting. Has taken ibuprofen for this without improvement. Had PAP/GYN 2012. Was found on u/s to have fibroids. Still ammenorrheic.    Review of Systems  Constitutional: Negative for appetite change and unexpected weight change.  Gastrointestinal: Negative for diarrhea and constipation.  Genitourinary: Negative for dysuria and menstrual problem.  Musculoskeletal: Positive for arthralgias.       Objective:   Physical Exam  Constitutional: She appears well-developed and well-nourished.  Eyes: EOM are normal. Pupils are equal, round, and reactive to light.  Neck: Neck supple.  Cardiovascular: Normal rate, regular rhythm and normal heart sounds.   Pulmonary/Chest: Effort normal and breath sounds normal.  Abdominal: Soft. Bowel sounds are normal. There is no tenderness.  Musculoskeletal:       Arms: Lymphadenopathy:    She has no cervical adenopathy.          Assessment & Plan:

## 2010-11-13 LAB — GC/CHLAMYDIA PROBE AMP, URINE
Chlamydia, Swab/Urine, PCR: NEGATIVE
GC Probe Amp, Urine: NEGATIVE

## 2010-11-14 ENCOUNTER — Ambulatory Visit (HOSPITAL_COMMUNITY)
Admission: RE | Admit: 2010-11-14 | Discharge: 2010-11-14 | Disposition: A | Discharge status: Home / Self Care | Payer: Self-pay | Source: Ambulatory Visit | Attending: Infectious Diseases | Admitting: Infectious Diseases

## 2010-11-14 DIAGNOSIS — R609 Edema, unspecified: Secondary | ICD-10-CM | POA: Insufficient documentation

## 2010-11-14 DIAGNOSIS — M19031 Primary osteoarthritis, right wrist: Secondary | ICD-10-CM

## 2010-11-19 ENCOUNTER — Telehealth: Payer: Self-pay

## 2010-11-19 ENCOUNTER — Ambulatory Visit: Payer: 59

## 2010-11-19 NOTE — Telephone Encounter (Signed)
Patient came in to renew her [facility] card and Juanell Fairly - she was approved for ADAP. 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 Also advised she was no longer eligible for the [facility] card or RW due to the fact she has insurance.  Called Turkey over at [facility] to ler her know patient has insurance - she said she would get in touch with ADAP - made copy of card and put in red folder.

## 2010-11-27 ENCOUNTER — Other Ambulatory Visit: Payer: Self-pay | Admitting: *Deleted

## 2010-11-27 DIAGNOSIS — M19031 Primary osteoarthritis, right wrist: Secondary | ICD-10-CM

## 2010-11-27 DIAGNOSIS — B20 Human immunodeficiency virus [HIV] disease: Secondary | ICD-10-CM

## 2010-11-27 DIAGNOSIS — R52 Pain, unspecified: Secondary | ICD-10-CM

## 2010-11-27 MED ORDER — EFAVIRENZ-EMTRICITAB-TENOFOVIR 600-200-300 MG PO TABS: 1.0000 | ORAL_TABLET | Freq: Every day | ORAL | Status: DC

## 2010-11-27 MED ORDER — IBUPROFEN 800 MG PO TABS: 800.0000 mg | ORAL_TABLET | Freq: Three times a day (TID) | ORAL | Status: DC | PRN

## 2010-12-01 ENCOUNTER — Other Ambulatory Visit: Payer: Self-pay | Admitting: *Deleted

## 2010-12-01 DIAGNOSIS — B20 Human immunodeficiency virus [HIV] disease: Secondary | ICD-10-CM

## 2010-12-01 MED ORDER — EFAVIRENZ-EMTRICITAB-TENOFOVIR 600-200-300 MG PO TABS: 1.0000 | ORAL_TABLET | Freq: Every day | ORAL | Status: DC

## 2011-03-20 ENCOUNTER — Telehealth: Payer: Self-pay | Admitting: *Deleted

## 2011-03-20 ENCOUNTER — Other Ambulatory Visit: Payer: Self-pay | Admitting: *Deleted

## 2011-03-20 ENCOUNTER — Encounter: Payer: Self-pay | Admitting: Infectious Diseases

## 2011-03-20 ENCOUNTER — Ambulatory Visit (INDEPENDENT_AMBULATORY_CARE_PROVIDER_SITE_OTHER): Payer: 59 | Admitting: Infectious Diseases

## 2011-03-20 VITALS — BP 159/99 | HR 74 | Temp 97.5°F | Ht 64.0 in | Wt 241.0 lb

## 2011-03-20 DIAGNOSIS — B20 Human immunodeficiency virus [HIV] disease: Secondary | ICD-10-CM

## 2011-03-20 DIAGNOSIS — Z79899 Other long term (current) drug therapy: Secondary | ICD-10-CM

## 2011-03-20 DIAGNOSIS — M19031 Primary osteoarthritis, right wrist: Secondary | ICD-10-CM

## 2011-03-20 DIAGNOSIS — Z23 Encounter for immunization: Secondary | ICD-10-CM

## 2011-03-20 DIAGNOSIS — M19039 Primary osteoarthritis, unspecified wrist: Secondary | ICD-10-CM

## 2011-03-20 DIAGNOSIS — R52 Pain, unspecified: Secondary | ICD-10-CM

## 2011-03-20 DIAGNOSIS — Z113 Encounter for screening for infections with a predominantly sexual mode of transmission: Secondary | ICD-10-CM

## 2011-03-20 DIAGNOSIS — R51 Headache: Secondary | ICD-10-CM

## 2011-03-20 DIAGNOSIS — B372 Candidiasis of skin and nail: Secondary | ICD-10-CM

## 2011-03-20 DIAGNOSIS — G4700 Insomnia, unspecified: Secondary | ICD-10-CM

## 2011-03-20 LAB — COMPREHENSIVE METABOLIC PANEL
ALT: 15 U/L (ref 0–35)
AST: 11 U/L (ref 0–37)
Alkaline Phosphatase: 82 U/L (ref 39–117)
BUN: 13 mg/dL (ref 6–23)
Calcium: 8.9 mg/dL (ref 8.4–10.5)
Chloride: 106 mEq/L (ref 96–112)
Creat: 0.66 mg/dL (ref 0.50–1.10)

## 2011-03-20 LAB — T-HELPER CELL (CD4) - (RCID CLINIC ONLY): CD4 % Helper T Cell: 23 % — ABNORMAL LOW (ref 33–55)

## 2011-03-20 LAB — CBC
HCT: 34.7 % — ABNORMAL LOW (ref 36.0–46.0)
MCHC: 31.4 g/dL (ref 30.0–36.0)
MCV: 88.3 fL (ref 78.0–100.0)
Platelets: 342 10*3/uL (ref 150–400)
RDW: 13.9 % (ref 11.5–15.5)
WBC: 4.7 10*3/uL (ref 4.0–10.5)

## 2011-03-20 LAB — LIPID PANEL
HDL: 48 mg/dL (ref >39)
LDL Cholesterol: 112 mg/dL — ABNORMAL HIGH (ref 0–99)
Total CHOL/HDL Ratio: 3.7 Ratio
VLDL: 19 mg/dL (ref 0–40)

## 2011-03-20 LAB — RPR

## 2011-03-20 MED ORDER — FLUCONAZOLE 100 MG PO TABS: 100.0000 mg | ORAL_TABLET | Freq: Every day | ORAL | Status: DC

## 2011-03-20 MED ORDER — MICONAZOLE NITRATE 2 % EX POWD: CUTANEOUS | Status: AC | PRN

## 2011-03-20 MED ORDER — IBUPROFEN 800 MG PO TABS: 800.0000 mg | ORAL_TABLET | Freq: Three times a day (TID) | ORAL | Status: DC | PRN

## 2011-03-20 MED ORDER — TEMAZEPAM 15 MG PO CAPS: 15.0000 mg | ORAL_CAPSULE | Freq: Every evening | ORAL | Status: AC | PRN

## 2011-03-20 NOTE — Assessment & Plan Note (Signed)
Short course of diflucan, topical antifungal as well

## 2011-03-20 NOTE — Telephone Encounter (Signed)
Dr. [name] wanted to refer patient to orthopedic for swollen right wrist. Scheduled appointment with Dr. Orlan Leavens at [facility] orthopedic and patient refused appointment.  Cancelled referral, if patient changes her mind she will let us know. Wendall Mola CMA

## 2011-03-20 NOTE — Assessment & Plan Note (Signed)
Trial of restoril 

## 2011-03-20 NOTE — Assessment & Plan Note (Signed)
Will see if we can get her eval by ortho.

## 2011-03-20 NOTE — Assessment & Plan Note (Addendum)
She is doing well, will send her to lab. Given condoms. Update pnvx. Continue her current rx. Needs pap this year with denise.

## 2011-03-20 NOTE — Progress Notes (Signed)
Addended by: Wynelle Cleveland on: 03/20/2011 10:34 AM   Modules accepted: Orders

## 2011-03-20 NOTE — Assessment & Plan Note (Signed)
Stress vs htn. Will f/u at her next visit.

## 2011-03-20 NOTE — Progress Notes (Signed)
Addended by: Wendall Mola A on: 03/20/2011 10:53 AM   Modules accepted: Orders

## 2011-03-20 NOTE — Progress Notes (Signed)
  Subjective:    Patient ID: Katrina Parker, female    DOB: March 27, 1970, 41 y.o.   MRN: [id number]  HPI 41 yo [place] F who was Dx with HIV+ 2008. She is currently taking atripla, CD4 410 VL<20 (10-21-10).  Has had rash on her L popliteal fossa. Was given rx before (?) and resolved. Rash is itchy. No problems with atripla. Still has swelling in her R wrist. Still has pain. Has taken ibuprofen (for her hip as well) but has run out.    Review of Systems  Constitutional: Negative for appetite change and unexpected weight change.  Cardiovascular: Negative for chest pain.  Gastrointestinal: Negative for diarrhea and constipation.  Genitourinary: Negative for dysuria.  Musculoskeletal: Positive for joint swelling and arthralgias.  Skin: Positive for rash.  Neurological: Positive for dizziness and headaches.       Relates dizziness to arguing with her son, also attributes sleep problems to this.   Psychiatric/Behavioral: Positive for sleep disturbance.       Objective:   Physical Exam  Constitutional: She appears well-developed and well-nourished.  HENT:  Head: Normocephalic.  Mouth/Throat: No oropharyngeal exudate.  Eyes: EOM are normal. Pupils are equal, round, and reactive to light.  Neck: Neck supple.  Cardiovascular: Normal rate, regular rhythm and normal heart sounds.   Pulmonary/Chest: Effort normal and breath sounds normal.  Abdominal: Soft. Bowel sounds are normal. There is no tenderness.  Musculoskeletal: She exhibits edema and tenderness.       Arms: Lymphadenopathy:    She has no cervical adenopathy.          Assessment & Plan:

## 2011-03-23 LAB — ANA: Anti Nuclear Antibody(ANA): NEGATIVE

## 2011-03-24 LAB — HIV-1 RNA QUANT-NO REFLEX-BLD: HIV 1 RNA Quant: <20 copies/mL (ref <20)

## 2011-03-25 ENCOUNTER — Other Ambulatory Visit: Payer: 59

## 2011-04-06 ENCOUNTER — Other Ambulatory Visit: Payer: Self-pay | Admitting: *Deleted

## 2011-04-06 DIAGNOSIS — B20 Human immunodeficiency virus [HIV] disease: Secondary | ICD-10-CM

## 2011-04-06 MED ORDER — EFAVIRENZ-EMTRICITAB-TENOFOVIR 600-200-300 MG PO TABS: 1.0000 | ORAL_TABLET | Freq: Every day | ORAL | Status: DC

## 2011-04-08 ENCOUNTER — Other Ambulatory Visit: Payer: Self-pay | Admitting: Infectious Diseases

## 2011-04-08 DIAGNOSIS — B20 Human immunodeficiency virus [HIV] disease: Secondary | ICD-10-CM

## 2011-04-09 ENCOUNTER — Ambulatory Visit: Payer: 59 | Admitting: Infectious Diseases

## 2011-05-01 ENCOUNTER — Ambulatory Visit (INDEPENDENT_AMBULATORY_CARE_PROVIDER_SITE_OTHER): Payer: 59 | Admitting: *Deleted

## 2011-05-01 DIAGNOSIS — Z124 Encounter for screening for malignant neoplasm of cervix: Secondary | ICD-10-CM

## 2011-05-01 NOTE — Patient Instructions (Signed)
  Your results will be ready in about a week.  I will mail them to you.  Thank you for coming to the Center for your care.  Audriana Aldama 

## 2011-05-01 NOTE — Progress Notes (Signed)
  Subjective:     Katrina Parker is a 41 y.o. woman who comes in today for a  pap smear only.  Contraception: Condoms  Objective:  Last menses 04/06/11  Pelvic Exam:  Pap smear obtained.   Assessment:    Screening pap smear.   Plan:    Follow up in one year, or as indicated by Pap results.  Pt given educational materials re: HIV and women, diet, exercise, heart disease, PAP smears and BSE. Pt given condoms.

## 2011-05-06 ENCOUNTER — Other Ambulatory Visit: Payer: Self-pay | Admitting: Infectious Diseases

## 2011-05-06 ENCOUNTER — Encounter: Payer: Self-pay | Admitting: *Deleted

## 2011-05-06 ENCOUNTER — Telehealth: Payer: Self-pay | Admitting: *Deleted

## 2011-05-06 DIAGNOSIS — A5909 Other urogenital trichomoniasis: Secondary | ICD-10-CM

## 2011-05-06 MED ORDER — METRONIDAZOLE 500 MG PO TABS: ORAL_TABLET | ORAL | Status: DC

## 2011-05-06 NOTE — Telephone Encounter (Signed)
Message left for the pt informing her that the prescription was sent to [place], [place], [place] and that is will arrive shortly.  The pt needs to notify her sexual partner that he needs to seek treatment for Trichomonas otherwise they will re-infect each other.  Encouraged pt to call back if she has questions.  Pt called back almost immediately.  RN repeated the information, encourage pt to take rx when it arrives, inform her partner that he needs to seek evaluation and treatment and to always use condoms when having sexual intercourse.  Pt verbalized understanding.

## 2011-05-12 ENCOUNTER — Telehealth: Payer: Self-pay | Admitting: *Deleted

## 2011-05-12 DIAGNOSIS — M19031 Primary osteoarthritis, right wrist: Secondary | ICD-10-CM

## 2011-05-12 DIAGNOSIS — R52 Pain, unspecified: Secondary | ICD-10-CM

## 2011-05-12 DIAGNOSIS — B20 Human immunodeficiency virus [HIV] disease: Secondary | ICD-10-CM

## 2011-05-12 MED ORDER — IBUPROFEN 800 MG PO TABS: 800.0000 mg | ORAL_TABLET | Freq: Three times a day (TID) | ORAL | Status: DC | PRN

## 2011-05-12 MED ORDER — EFAVIRENZ-EMTRICITAB-TENOFOVIR 600-200-300 MG PO TABS: 1.0000 | ORAL_TABLET | Freq: Every day | ORAL | Status: DC

## 2011-05-12 NOTE — Telephone Encounter (Signed)
Patient wants to change her pharmacy to [facility] on [address] with her next refill and asked if we could add it to her chart. Advised her will add to profile.

## 2011-05-21 ENCOUNTER — Other Ambulatory Visit: Payer: Self-pay | Admitting: *Deleted

## 2011-05-21 DIAGNOSIS — B20 Human immunodeficiency virus [HIV] disease: Secondary | ICD-10-CM

## 2011-05-21 DIAGNOSIS — R52 Pain, unspecified: Secondary | ICD-10-CM

## 2011-05-21 DIAGNOSIS — M19031 Primary osteoarthritis, right wrist: Secondary | ICD-10-CM

## 2011-05-21 DIAGNOSIS — A5909 Other urogenital trichomoniasis: Secondary | ICD-10-CM

## 2011-05-21 MED ORDER — IBUPROFEN 800 MG PO TABS: 800.0000 mg | ORAL_TABLET | Freq: Three times a day (TID) | ORAL | Status: DC | PRN

## 2011-05-21 MED ORDER — METRONIDAZOLE 500 MG PO TABS: ORAL_TABLET | ORAL | Status: DC

## 2011-05-21 MED ORDER — EFAVIRENZ-EMTRICITAB-TENOFOVIR 600-200-300 MG PO TABS: 1.0000 | ORAL_TABLET | Freq: Every day | ORAL | Status: DC

## 2011-06-10 ENCOUNTER — Telehealth: Payer: Self-pay | Admitting: *Deleted

## 2011-06-10 NOTE — Telephone Encounter (Signed)
Received the Prior auth for the patient Atripla and called the pharmacy to advise. They advised they got a fax and had called the patient to let her know she could come pick up her meds.

## 2011-07-20 ENCOUNTER — Other Ambulatory Visit: Payer: 59

## 2011-07-20 DIAGNOSIS — Z113 Encounter for screening for infections with a predominantly sexual mode of transmission: Secondary | ICD-10-CM

## 2011-07-20 DIAGNOSIS — B20 Human immunodeficiency virus [HIV] disease: Secondary | ICD-10-CM

## 2011-07-20 DIAGNOSIS — Z79899 Other long term (current) drug therapy: Secondary | ICD-10-CM

## 2011-07-20 LAB — COMPLETE METABOLIC PANEL WITHOUT GFR
ALT: 16 U/L (ref 0–35)
AST: 15 U/L (ref 0–37)
Albumin: 3.7 g/dL (ref 3.5–5.2)
Alkaline Phosphatase: 79 U/L (ref 39–117)
BUN: 15 mg/dL (ref 6–23)
CO2: 27 meq/L (ref 19–32)
Calcium: 9.1 mg/dL (ref 8.4–10.5)
Chloride: 104 meq/L (ref 96–112)
Creat: 0.7 mg/dL (ref 0.50–1.10)
GFR, Est African American: >89 mL/min
GFR, Est Non African American: >89 mL/min
Glucose, Bld: 212 mg/dL — ABNORMAL HIGH (ref 70–99)
Potassium: 4.5 meq/L (ref 3.5–5.3)
Sodium: 138 meq/L (ref 135–145)
Total Bilirubin: 0.3 mg/dL (ref 0.3–1.2)
Total Protein: 6.7 g/dL (ref 6.0–8.3)

## 2011-07-20 LAB — SYPHILIS: RPR W/REFLEX TO RPR TITER AND TREPONEMAL ANTIBODIES, TRADITIONAL SCREENING AND DIAGNOSIS ALGORITHM

## 2011-07-20 LAB — LIPID PANEL
Cholesterol: 183 mg/dL (ref 0–200)
HDL: 51 mg/dL
LDL Cholesterol: 112 mg/dL — ABNORMAL HIGH (ref 0–99)
Total CHOL/HDL Ratio: 3.6 ratio
Triglycerides: 99 mg/dL
VLDL: 20 mg/dL (ref 0–40)

## 2011-07-20 LAB — CBC
HCT: 32.3 % — ABNORMAL LOW (ref 36.0–46.0)
Hemoglobin: 10.7 g/dL — ABNORMAL LOW (ref 12.0–15.0)
MCV: 84.6 fL (ref 78.0–100.0)
RBC: 3.82 MIL/uL — ABNORMAL LOW (ref 3.87–5.11)
RDW: 14.8 % (ref 11.5–15.5)
WBC: 4.8 10*3/uL (ref 4.0–10.5)

## 2011-07-21 LAB — HIV-1 RNA QUANT-NO REFLEX-BLD: HIV-1 RNA Quant, Log: <1.3 {Log} (ref <1.30)

## 2011-07-21 LAB — T-HELPER CELL (CD4) - (RCID CLINIC ONLY)
CD4 % Helper T Cell: 24 % — ABNORMAL LOW (ref 33–55)
CD4 T Cell Abs: 490 uL (ref 400–2700)

## 2011-08-03 ENCOUNTER — Encounter: Payer: 59 | Admitting: Infectious Diseases

## 2011-08-03 ENCOUNTER — Encounter: Payer: Self-pay | Admitting: Infectious Diseases

## 2011-08-13 ENCOUNTER — Other Ambulatory Visit: Payer: Self-pay | Admitting: Infectious Diseases

## 2011-08-13 DIAGNOSIS — B20 Human immunodeficiency virus [HIV] disease: Secondary | ICD-10-CM

## 2011-09-17 ENCOUNTER — Other Ambulatory Visit: Payer: Self-pay | Admitting: *Deleted

## 2011-09-17 DIAGNOSIS — B20 Human immunodeficiency virus [HIV] disease: Secondary | ICD-10-CM

## 2011-09-17 MED ORDER — EFAVIRENZ-EMTRICITAB-TENOFOVIR 600-200-300 MG PO TABS: 1.0000 | ORAL_TABLET | Freq: Every day | ORAL | Status: DC

## 2011-09-18 ENCOUNTER — Other Ambulatory Visit: Payer: Self-pay | Admitting: *Deleted

## 2011-09-18 DIAGNOSIS — M19031 Primary osteoarthritis, right wrist: Secondary | ICD-10-CM

## 2011-09-18 DIAGNOSIS — R52 Pain, unspecified: Secondary | ICD-10-CM

## 2011-09-18 MED ORDER — IBUPROFEN 800 MG PO TABS: 800.0000 mg | ORAL_TABLET | Freq: Three times a day (TID) | ORAL | Status: DC | PRN

## 2011-11-13 ENCOUNTER — Other Ambulatory Visit: Payer: Self-pay | Admitting: Infectious Diseases

## 2012-02-04 ENCOUNTER — Other Ambulatory Visit: Payer: 59

## 2012-02-04 DIAGNOSIS — B20 Human immunodeficiency virus [HIV] disease: Secondary | ICD-10-CM

## 2012-02-04 LAB — COMPREHENSIVE METABOLIC PANEL
AST: 13 U/L (ref 0–37)
BUN: 17 mg/dL (ref 6–23)
Calcium: 8.9 mg/dL (ref 8.4–10.5)
Chloride: 105 mEq/L (ref 96–112)
Creat: 0.62 mg/dL (ref 0.50–1.10)

## 2012-02-04 LAB — CBC WITH DIFFERENTIAL/PLATELET
Basophils Absolute: 0 10*3/uL (ref 0.0–0.1)
Basophils Relative: 0 % (ref 0–1)
Eosinophils Relative: 4 % (ref 0–5)
HCT: 34.8 % — ABNORMAL LOW (ref 36.0–46.0)
Lymphocytes Relative: 46 % (ref 12–46)
MCH: 27.7 pg (ref 26.0–34.0)
MCHC: 32.5 g/dL (ref 30.0–36.0)
MCV: 85.3 fL (ref 78.0–100.0)
Monocytes Absolute: 0.2 10*3/uL (ref 0.1–1.0)
RDW: 14.5 % (ref 11.5–15.5)

## 2012-02-05 LAB — HIV-1 RNA QUANT-NO REFLEX-BLD: HIV-1 RNA Quant, Log: <1.3 {Log} (ref <1.30)

## 2012-02-22 ENCOUNTER — Encounter: Payer: Self-pay | Admitting: Infectious Diseases

## 2012-02-22 ENCOUNTER — Ambulatory Visit (INDEPENDENT_AMBULATORY_CARE_PROVIDER_SITE_OTHER): Payer: 59 | Admitting: Infectious Diseases

## 2012-02-22 VITALS — BP 159/97 | HR 85 | Temp 98.4°F | Ht 61.0 in | Wt 243.0 lb

## 2012-02-22 DIAGNOSIS — R739 Hyperglycemia, unspecified: Secondary | ICD-10-CM

## 2012-02-22 DIAGNOSIS — R7309 Other abnormal glucose: Secondary | ICD-10-CM

## 2012-02-22 DIAGNOSIS — I10 Essential (primary) hypertension: Secondary | ICD-10-CM

## 2012-02-22 DIAGNOSIS — R21 Rash and other nonspecific skin eruption: Secondary | ICD-10-CM

## 2012-02-22 DIAGNOSIS — Z113 Encounter for screening for infections with a predominantly sexual mode of transmission: Secondary | ICD-10-CM

## 2012-02-22 DIAGNOSIS — B20 Human immunodeficiency virus [HIV] disease: Secondary | ICD-10-CM

## 2012-02-22 DIAGNOSIS — M25551 Pain in right hip: Secondary | ICD-10-CM

## 2012-02-22 DIAGNOSIS — Z23 Encounter for immunization: Secondary | ICD-10-CM

## 2012-02-22 DIAGNOSIS — G8929 Other chronic pain: Secondary | ICD-10-CM

## 2012-02-22 DIAGNOSIS — M25559 Pain in unspecified hip: Secondary | ICD-10-CM

## 2012-02-22 DIAGNOSIS — Z79899 Other long term (current) drug therapy: Secondary | ICD-10-CM

## 2012-02-22 MED ORDER — IBUPROFEN 800 MG PO TABS: ORAL_TABLET | ORAL | Status: DC

## 2012-02-22 NOTE — Assessment & Plan Note (Signed)
She is doing very well. Will continue her atripla. Have her f/u in 6 months with labs prior. Gets flu shot today. Given condoms.

## 2012-02-22 NOTE — Assessment & Plan Note (Signed)
Will ask derm to eval.

## 2012-02-22 NOTE — Assessment & Plan Note (Signed)
Her last 2 Glc have been > 200. Will have her seen by IM.

## 2012-02-22 NOTE — Assessment & Plan Note (Signed)
This is a new finding for her. She works at [employer]. Will ask her to check her BP while at work, let us know if over 140 SBP.

## 2012-02-22 NOTE — Progress Notes (Signed)
  Subjective:    Patient ID: Katrina Parker, female    DOB: [birth year], 42 y.o.   MRN: [id number]  HPI 41 yo [place] F who was Dx with HIV+ 2008. She is currently taking atripla.  Has had rash on her L popliteal fossa. Has some itching and burning with this. Feels like it becomes more proximal with itching.  Still having pain in her R hip- improves with ibuprofen. She states she has been approved for surgery but is scared (has had prevsiously x 2).   HIV 1 RNA Quant (copies/mL)  Date Value  02/04/2012 <20   07/20/2011 <20   03/20/2011 <20      CD4 T Cell Abs (cmm)  Date Value  02/04/2012 460   07/20/2011 490   03/20/2011 470       Review of Systems  Constitutional: Negative for appetite change and unexpected weight change.  Gastrointestinal: Negative for diarrhea and constipation.  Genitourinary: Negative for dysuria.  Musculoskeletal: Positive for arthralgias.  Skin: Positive for rash.       Objective:   Physical Exam  Constitutional: She appears well-developed and well-nourished.  HENT:  Mouth/Throat: No oropharyngeal exudate.  Eyes: EOM are normal. Pupils are equal, round, and reactive to light.  Neck: Neck supple.  Cardiovascular: Normal rate, regular rhythm and normal heart sounds.   Pulmonary/Chest: Effort normal and breath sounds normal. No respiratory distress.  Abdominal: Soft. Bowel sounds are normal. She exhibits no distension. There is no tenderness.  Musculoskeletal: She exhibits no edema.  Lymphadenopathy:    She has no cervical adenopathy.  Skin:             Assessment & Plan:

## 2012-02-22 NOTE — Assessment & Plan Note (Signed)
Will refill her NSAID

## 2012-02-23 ENCOUNTER — Encounter: Payer: Self-pay | Admitting: *Deleted

## 2012-02-23 ENCOUNTER — Telehealth: Payer: Self-pay | Admitting: *Deleted

## 2012-02-23 NOTE — Telephone Encounter (Signed)
Attempted to call patient to notify her of 2 referral appointments made, but was unable to reach her at any listed number.  Letters sent with the appointment information.  [facility] - Dr. Rogelia Boga, 03/15/12 at 11:15am.  [facility], 03/30/12 at 1:20pm.

## 2012-02-23 NOTE — Progress Notes (Signed)
This encounter was created in error - please disregard.

## 2012-02-29 ENCOUNTER — Ambulatory Visit: Payer: 59

## 2012-03-15 ENCOUNTER — Encounter: Payer: 59 | Admitting: Internal Medicine

## 2012-03-15 ENCOUNTER — Ambulatory Visit: Payer: 59 | Admitting: Internal Medicine

## 2012-04-07 ENCOUNTER — Other Ambulatory Visit: Payer: Self-pay | Admitting: Infectious Diseases

## 2012-08-01 ENCOUNTER — Other Ambulatory Visit: Payer: 59

## 2012-08-01 DIAGNOSIS — Z79899 Other long term (current) drug therapy: Secondary | ICD-10-CM

## 2012-08-01 DIAGNOSIS — B20 Human immunodeficiency virus [HIV] disease: Secondary | ICD-10-CM

## 2012-08-01 LAB — COMPREHENSIVE METABOLIC PANEL
ALT: 17 U/L (ref 0–35)
BUN: 20 mg/dL (ref 6–23)
CO2: 23 mEq/L (ref 19–32)
Calcium: 9 mg/dL (ref 8.4–10.5)
Chloride: 102 mEq/L (ref 96–112)
Creat: 0.66 mg/dL (ref 0.50–1.10)
Glucose, Bld: 296 mg/dL — ABNORMAL HIGH (ref 70–99)

## 2012-08-01 LAB — CBC
HCT: 37.4 % (ref 36.0–46.0)
Hemoglobin: 12.8 g/dL (ref 12.0–15.0)
MCV: 86 fL (ref 78.0–100.0)
RBC: 4.35 MIL/uL (ref 3.87–5.11)
WBC: 5.4 10*3/uL (ref 4.0–10.5)

## 2012-08-01 LAB — LIPID PANEL
Cholesterol: 212 mg/dL — ABNORMAL HIGH (ref 0–200)
HDL: 61 mg/dL (ref >39)
Total CHOL/HDL Ratio: 3.5 Ratio
Triglycerides: 133 mg/dL (ref <150)

## 2012-08-02 LAB — T-HELPER CELL (CD4) - (RCID CLINIC ONLY): CD4 T Cell Abs: 590 uL (ref 400–2700)

## 2012-09-01 ENCOUNTER — Other Ambulatory Visit: Payer: Self-pay | Admitting: *Deleted

## 2012-09-01 DIAGNOSIS — B20 Human immunodeficiency virus [HIV] disease: Secondary | ICD-10-CM

## 2012-09-01 MED ORDER — EFAVIRENZ-EMTRICITAB-TENOFOVIR 600-200-300 MG PO TABS: ORAL_TABLET | ORAL | Status: DC

## 2012-09-14 ENCOUNTER — Encounter: Payer: Self-pay | Admitting: Infectious Diseases

## 2012-09-14 ENCOUNTER — Ambulatory Visit (INDEPENDENT_AMBULATORY_CARE_PROVIDER_SITE_OTHER): Payer: 59 | Admitting: Infectious Diseases

## 2012-09-14 VITALS — BP 139/89 | HR 80 | Temp 98.5°F | Ht 63.0 in | Wt 234.0 lb

## 2012-09-14 DIAGNOSIS — B20 Human immunodeficiency virus [HIV] disease: Secondary | ICD-10-CM

## 2012-09-14 DIAGNOSIS — Z23 Encounter for immunization: Secondary | ICD-10-CM

## 2012-09-14 DIAGNOSIS — R739 Hyperglycemia, unspecified: Secondary | ICD-10-CM

## 2012-09-14 DIAGNOSIS — I10 Essential (primary) hypertension: Secondary | ICD-10-CM

## 2012-09-14 DIAGNOSIS — R7309 Other abnormal glucose: Secondary | ICD-10-CM

## 2012-09-14 NOTE — Progress Notes (Signed)
  Subjective:    Patient ID: Katrina Parker, female    DOB: 06/15/1970, 42 y.o.   MRN: [id number]  HPI 42 yo [place] F who was Dx with HIV+ 2008. She is currently off atripla. Off meds since May. Has been feeling well.  Has never taken anti-htn rx.   HIV 1 RNA Quant (copies/mL)  Date Value  08/01/2012 <20   02/04/2012 <20   07/20/2011 <20      CD4 T Cell Abs (cmm)  Date Value  08/01/2012 590   02/04/2012 460   07/20/2011 490      Review of Systems  Constitutional: Negative for appetite change and unexpected weight change.  Gastrointestinal: Negative for diarrhea and constipation.  Genitourinary: Negative for difficulty urinating.  Neurological: Positive for headaches.       Ha mostly at night.        Objective:   Physical Exam  Constitutional: She appears well-developed and well-nourished.  HENT:  Mouth/Throat: No oropharyngeal exudate.  Eyes: EOM are normal. Pupils are equal, round, and reactive to light.  Neck: Neck supple.  Cardiovascular: Normal rate, regular rhythm and normal heart sounds.   Pulmonary/Chest: Effort normal and breath sounds normal.  Abdominal: Soft. Bowel sounds are normal. There is no tenderness. There is no rebound.  Musculoskeletal: She exhibits no edema.  Lymphadenopathy:    She has no cervical adenopathy.          Assessment & Plan:

## 2012-09-14 NOTE — Assessment & Plan Note (Signed)
Still has not seen IM. Will check A1C today and re-refer her.

## 2012-09-14 NOTE — Assessment & Plan Note (Addendum)
She gets flu shot today. Will get condoms. Will refill her meds, straighten out disconnect with her pharmacy. See her back in 4 months. She is Hep B immune.  Will get pap this month.

## 2012-09-14 NOTE — Assessment & Plan Note (Signed)
Borderline today. Off meds will cont to watch.

## 2012-09-15 ENCOUNTER — Telehealth: Payer: Self-pay | Admitting: *Deleted

## 2012-09-15 LAB — T-HELPER CELL (CD4) - (RCID CLINIC ONLY): CD4 % Helper T Cell: 27 % — ABNORMAL LOW (ref 33–55)

## 2012-09-15 NOTE — Telephone Encounter (Signed)
Pt reports that she hasn't had her medication refilled since June.  RN called Sam at [employer]. He reports 1) lapsed Medicaid (although she has had [employer] all year); 2) not getting refill authorizations (but he did not call us to notify us of this at any time); 3) not being able to get in touch with patient for delivery of her refills (although he acknowledges that he was aware she works 2nd shift and is not available at her home after 2pm).  Per Sam, he is going to look into this further. RN spoke with Turkey, pt's case manager regarding her missed medication.  She will contact Tynika to see if it is ok to change her pharmacy to another option.  She will also speak to Naviyah about rescheduling and keeping her [facility] PCP appointments.

## 2012-09-16 LAB — HIV-1 RNA QUANT-NO REFLEX-BLD
HIV 1 RNA Quant: <20 copies/mL (ref <20)
HIV-1 RNA Quant, Log: <1.3 {Log} (ref <1.30)

## 2012-10-10 ENCOUNTER — Ambulatory Visit (INDEPENDENT_AMBULATORY_CARE_PROVIDER_SITE_OTHER): Payer: 59 | Admitting: *Deleted

## 2012-10-10 DIAGNOSIS — Z1231 Encounter for screening mammogram for malignant neoplasm of breast: Secondary | ICD-10-CM

## 2012-10-10 DIAGNOSIS — Z124 Encounter for screening for malignant neoplasm of cervix: Secondary | ICD-10-CM

## 2012-10-10 NOTE — Progress Notes (Signed)
  Subjective:     Katrina Parker is a 42 y.o. woman who comes in today for a  pap smear only.  Previous abnormal Pap smears: no. Contraception: not sexually active, given condoms today.  C/O vaginal intense itching prior to last menses.  Objective:    LMP 06/01/2011 Pelvic Exam:  Pap smear obtained.   Assessment:    Screening pap smear.   Plan:    Follow up in one year, or as indicated by Pap results.  Pt given educational materials re: HIV and women, self-esteem, nutrition and diet management, PAP smears and partner safety. Pt given condoms.

## 2012-10-10 NOTE — Patient Instructions (Addendum)
Your results will be ready in about a week.  I will mail them to you.  Thank you for coming to the Center for your care.  Ajeenah Heiny,  RN 

## 2012-10-11 ENCOUNTER — Other Ambulatory Visit: Payer: Self-pay | Admitting: Infectious Diseases

## 2012-10-11 DIAGNOSIS — M25559 Pain in unspecified hip: Secondary | ICD-10-CM

## 2012-10-13 ENCOUNTER — Telehealth: Payer: Self-pay | Admitting: *Deleted

## 2012-10-13 DIAGNOSIS — B373 Candidiasis of vulva and vagina: Secondary | ICD-10-CM

## 2012-10-13 DIAGNOSIS — B3731 Acute candidiasis of vulva and vagina: Secondary | ICD-10-CM

## 2012-10-13 MED ORDER — FLUCONAZOLE 150 MG PO TABS: 150.0000 mg | ORAL_TABLET | Freq: Once | ORAL | Status: DC

## 2012-10-13 NOTE — Telephone Encounter (Signed)
Per Dr. [name], Fluconazole 150 mg po, 1 tablet.

## 2012-10-20 ENCOUNTER — Ambulatory Visit (HOSPITAL_COMMUNITY)
Admission: RE | Admit: 2012-10-20 | Discharge: 2012-10-20 | Disposition: A | Discharge status: Home / Self Care | Payer: 59 | Source: Ambulatory Visit | Attending: Infectious Diseases | Admitting: Infectious Diseases

## 2012-10-20 DIAGNOSIS — Z1231 Encounter for screening mammogram for malignant neoplasm of breast: Secondary | ICD-10-CM | POA: Insufficient documentation

## 2012-11-04 ENCOUNTER — Other Ambulatory Visit: Payer: Self-pay | Admitting: Infectious Diseases

## 2012-11-04 DIAGNOSIS — B20 Human immunodeficiency virus [HIV] disease: Secondary | ICD-10-CM

## 2013-01-16 ENCOUNTER — Encounter: Payer: Self-pay | Admitting: Infectious Diseases

## 2013-01-16 ENCOUNTER — Ambulatory Visit (INDEPENDENT_AMBULATORY_CARE_PROVIDER_SITE_OTHER): Payer: 59 | Admitting: Infectious Diseases

## 2013-01-16 VITALS — BP 156/100 | HR 88 | Temp 98.0°F | Wt 230.0 lb

## 2013-01-16 DIAGNOSIS — Z113 Encounter for screening for infections with a predominantly sexual mode of transmission: Secondary | ICD-10-CM

## 2013-01-16 DIAGNOSIS — Z79899 Other long term (current) drug therapy: Secondary | ICD-10-CM

## 2013-01-16 DIAGNOSIS — B20 Human immunodeficiency virus [HIV] disease: Secondary | ICD-10-CM

## 2013-01-16 DIAGNOSIS — I10 Essential (primary) hypertension: Secondary | ICD-10-CM

## 2013-01-16 DIAGNOSIS — B372 Candidiasis of skin and nail: Secondary | ICD-10-CM

## 2013-01-16 LAB — COMPREHENSIVE METABOLIC PANEL
ALBUMIN: 4 g/dL (ref 3.5–5.2)
ALK PHOS: 118 U/L — AB (ref 39–117)
ALT: 15 U/L (ref 0–35)
AST: 13 U/L (ref 0–37)
BILIRUBIN TOTAL: 0.4 mg/dL (ref 0.3–1.2)
BUN: 13 mg/dL (ref 6–23)
CO2: 28 mEq/L (ref 19–32)
Calcium: 9.1 mg/dL (ref 8.4–10.5)
Chloride: 100 mEq/L (ref 96–112)
Creat: 0.54 mg/dL (ref 0.50–1.10)
Glucose, Bld: 315 mg/dL — ABNORMAL HIGH (ref 70–99)
POTASSIUM: 4 meq/L (ref 3.5–5.3)
SODIUM: 137 meq/L (ref 135–145)
Total Protein: 7 g/dL (ref 6.0–8.3)

## 2013-01-16 LAB — CBC
HCT: 40.7 % (ref 36.0–46.0)
Hemoglobin: 13.5 g/dL (ref 12.0–15.0)
MCH: 28.2 pg (ref 26.0–34.0)
MCHC: 33.2 g/dL (ref 30.0–36.0)
MCV: 85.1 fL (ref 78.0–100.0)
Platelets: 293 10*3/uL (ref 150–400)
RBC: 4.78 MIL/uL (ref 3.87–5.11)
RDW: 14.1 % (ref 11.5–15.5)
WBC: 4.5 10*3/uL (ref 4.0–10.5)

## 2013-01-16 LAB — TSH: TSH: 1.502 u[IU]/mL (ref 0.350–4.500)

## 2013-01-16 LAB — LIPID PANEL
CHOL/HDL RATIO: 3.6 ratio
CHOLESTEROL: 213 mg/dL — AB (ref 0–200)
HDL: 59 mg/dL (ref >39)
LDL Cholesterol: 120 mg/dL — ABNORMAL HIGH (ref 0–99)
Triglycerides: 170 mg/dL — ABNORMAL HIGH (ref <150)
VLDL: 34 mg/dL (ref 0–40)

## 2013-01-16 LAB — RPR

## 2013-01-16 MED ORDER — AMLODIPINE BESYLATE 5 MG PO TABS: 5.0000 mg | ORAL_TABLET | Freq: Every day | ORAL | Status: DC

## 2013-01-16 MED ORDER — FLUCONAZOLE 100 MG PO TABS: 100.0000 mg | ORAL_TABLET | Freq: Every day | ORAL | Status: AC

## 2013-01-16 NOTE — Assessment & Plan Note (Signed)
Will start her on CCB. Will see her back in 2-3 months.

## 2013-01-16 NOTE — Progress Notes (Signed)
   Subjective:    Patient ID: Katrina Parker, female    DOB: [birth year], 43 y.o.   MRN: [id number]  HPI 43 yo F with hx of HIV+, was off her meds at her previous visit due to difficulty getting meds filled through pharmacy.  Today states that she is on her ART daily. Has been taking ibuprofen but it has been giving her rash. Had episode of dizziness, headache yesterday. Today BP is 156/100. Has never been on anti-htn previously.   HIV 1 RNA Quant (copies/mL)  Date Value  09/14/2012 <20   08/01/2012 <20   02/04/2012 <20      CD4 T Cell Abs (/uL)  Date Value  09/14/2012 500   08/01/2012 590   02/04/2012 460      Review of Systems  Constitutional: Negative for appetite change and unexpected weight change.  Respiratory: Negative for shortness of breath.   Cardiovascular: Negative for chest pain.  Gastrointestinal: Negative for diarrhea and constipation.  Genitourinary: Negative for difficulty urinating.  Neurological: Positive for headaches.  no menses since last year. Has had vaginal itching recently as well. Had transient relief with diflucan after last pap. Denies blisters.      Objective:   Physical Exam  Constitutional: She appears well-developed and well-nourished.  HENT:  Mouth/Throat: No oropharyngeal exudate.  Eyes: EOM are normal. Pupils are equal, round, and reactive to light.  Neck: Neck supple.  Cardiovascular: Normal rate, regular rhythm and normal heart sounds.   Pulmonary/Chest: Effort normal and breath sounds normal.  Abdominal: Soft. Bowel sounds are normal. She exhibits no distension. There is no tenderness.  Lymphadenopathy:    She has no cervical adenopathy.          Assessment & Plan:

## 2013-01-16 NOTE — Assessment & Plan Note (Signed)
She is doing well. Will repeat her labs today, check TSH with her BP. She is offered/refuses condoms. Will see her back in 2-3 months. vax are up to date. She is Hep B immune.

## 2013-01-17 LAB — T-HELPER CELL (CD4) - (RCID CLINIC ONLY)
CD4 % Helper T Cell: 24 % — ABNORMAL LOW (ref 33–55)
CD4 T Cell Abs: 440 /uL (ref 400–2700)

## 2013-01-18 LAB — HIV-1 RNA QUANT-NO REFLEX-BLD
HIV 1 RNA Quant: <20 copies/mL (ref <20)
HIV-1 RNA Quant, Log: <1.3 {Log} (ref <1.30)

## 2013-03-03 IMAGING — US US PELVIS COMPLETE
1 series · 13 of 25 positions shown · non-contrast
Comparison: 08/17/2007

CLINICAL DATA: Abnormal uterine bleeding with sporadic menses.  LMP
05/26/2010

TRANSABDOMINAL AND TRANSVAGINAL ULTRASOUND OF PELVIS
TECHNIQUE: Both transabdominal and transvaginal ultrasound
examinations of the pelvis were performed. Transabdominal technique
was performed for global imaging of the pelvis including uterus,
ovaries, adnexal regions, and pelvic cul-de-sac.

[Series 1: us pelvis complete · 13 of 57 slices shown]
[im 1/57]
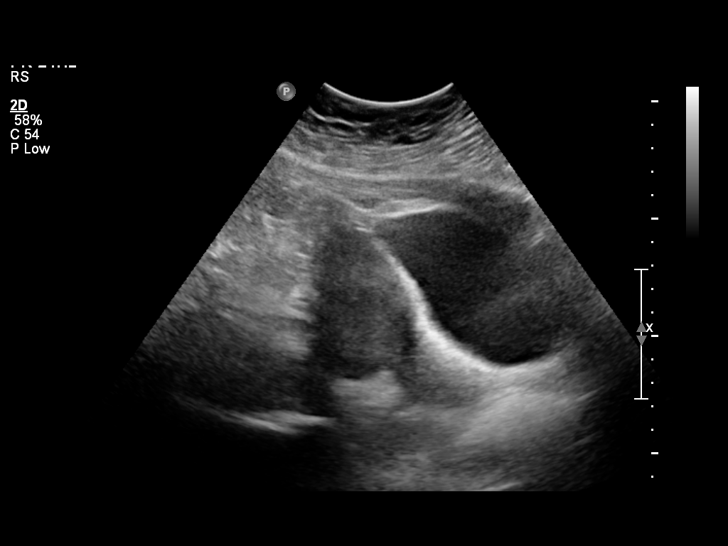
[im 5/57]
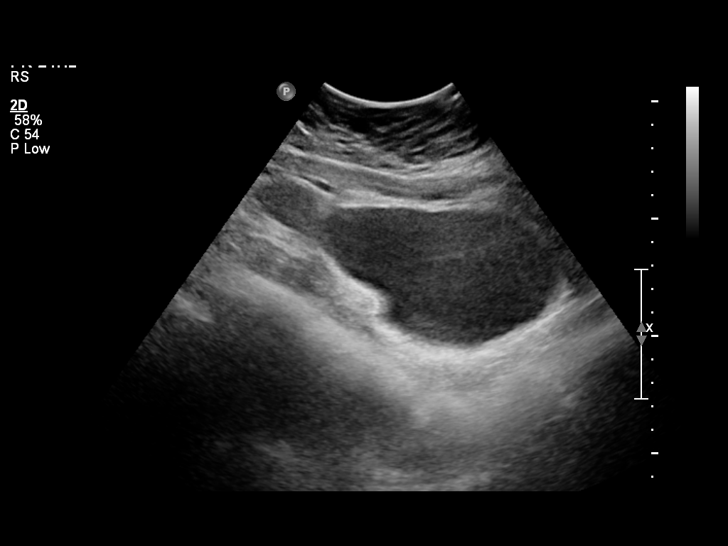
[im 10/57]
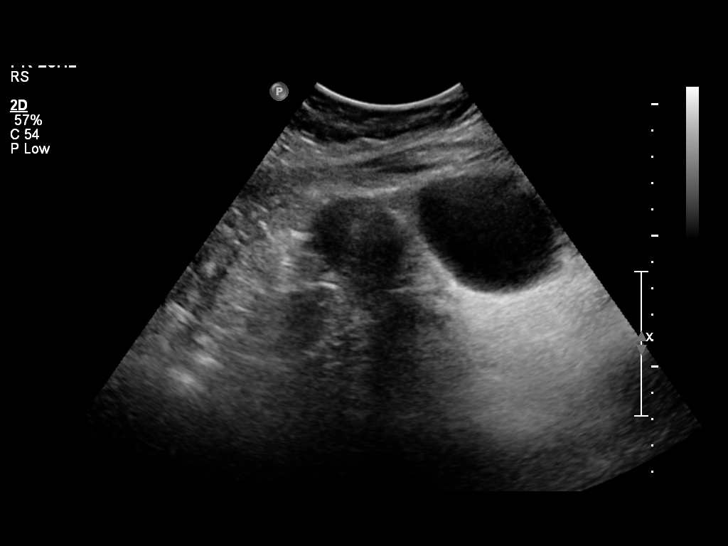
[im 15/57]
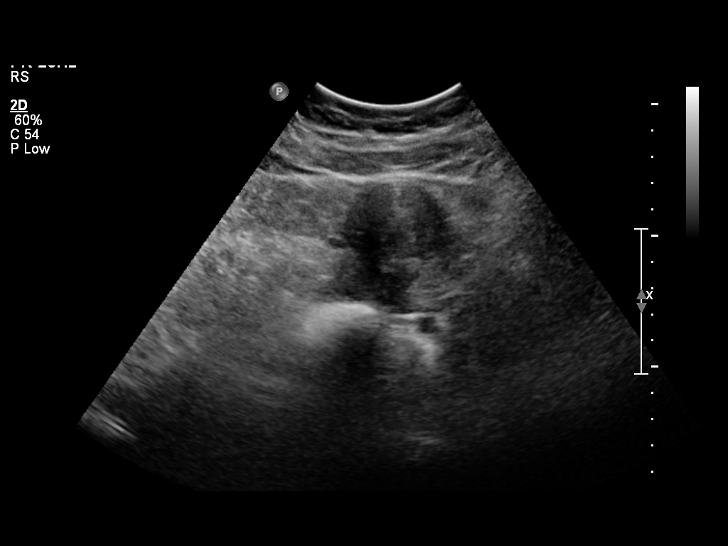
[im 19/57]
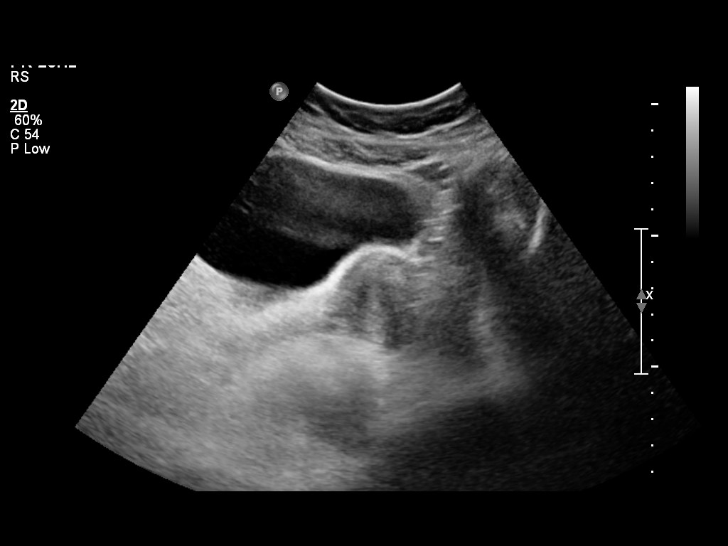
[im 24/57]
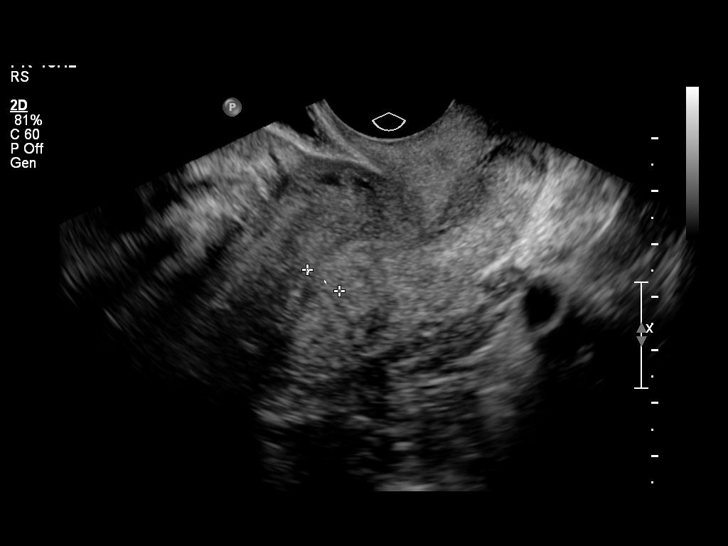
[im 29/57]
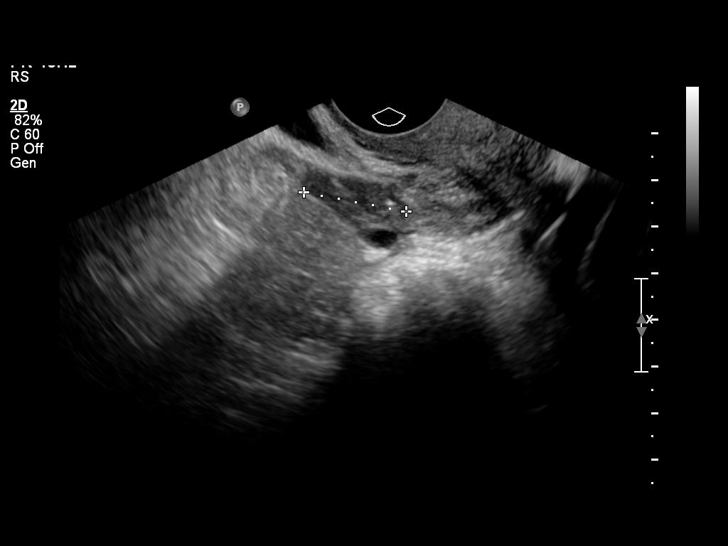
[im 33/57]
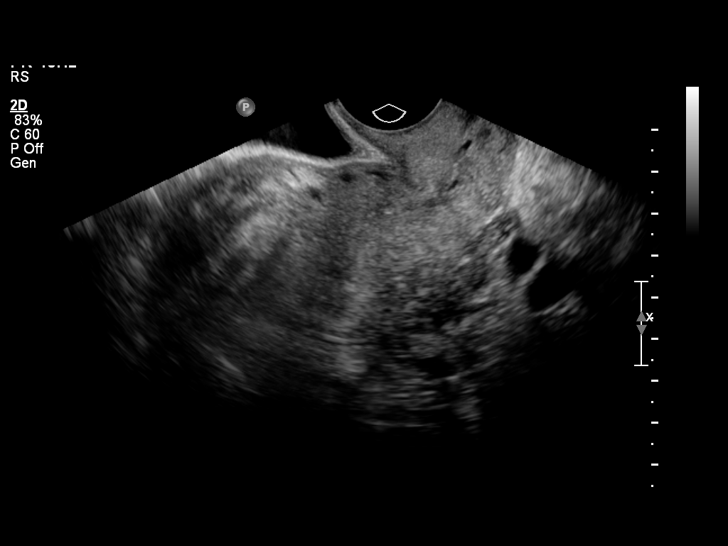
[im 38/57]
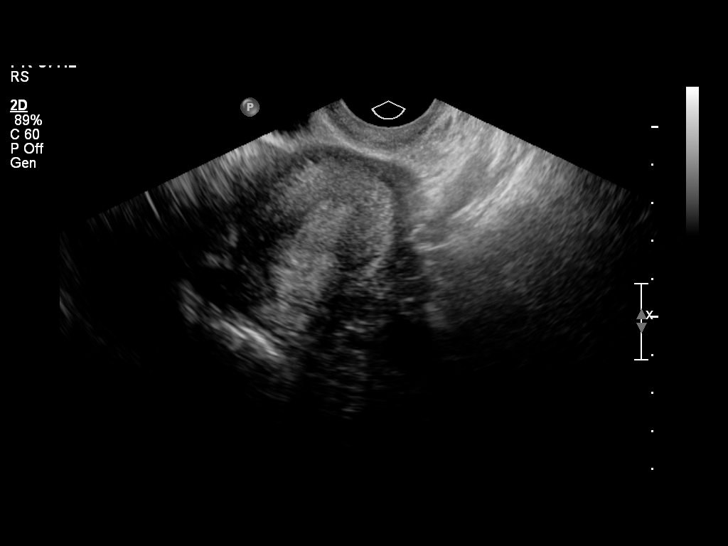
[im 43/57]
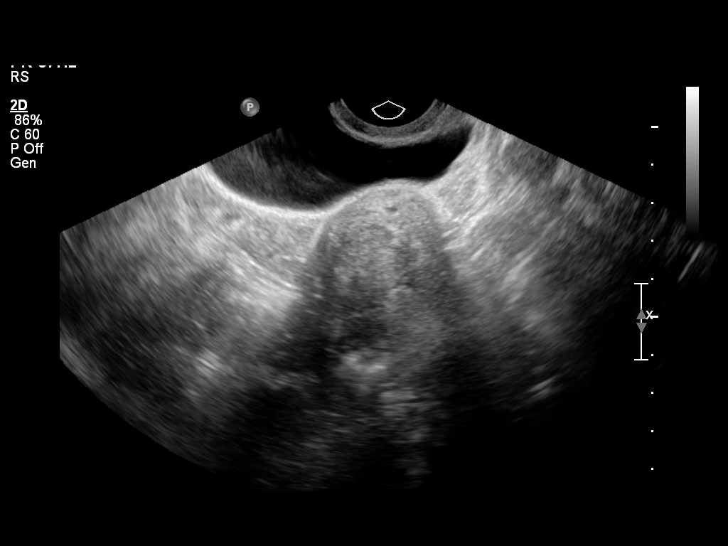
[im 47/57]
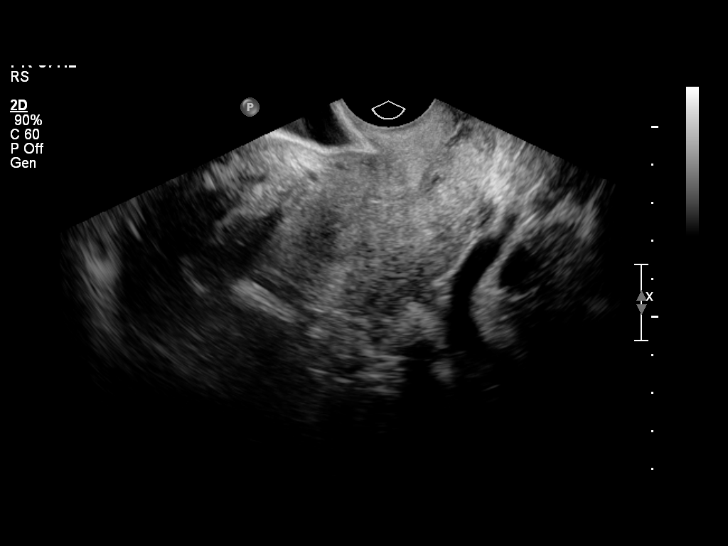
[im 52/57]
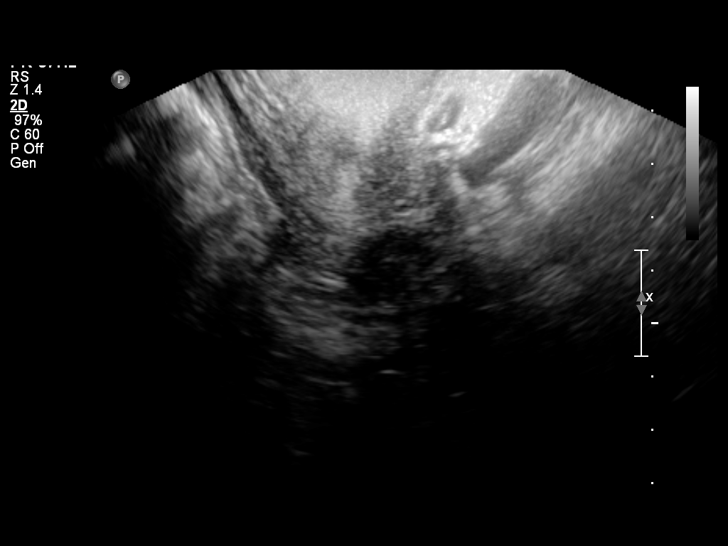
[im 57/57]
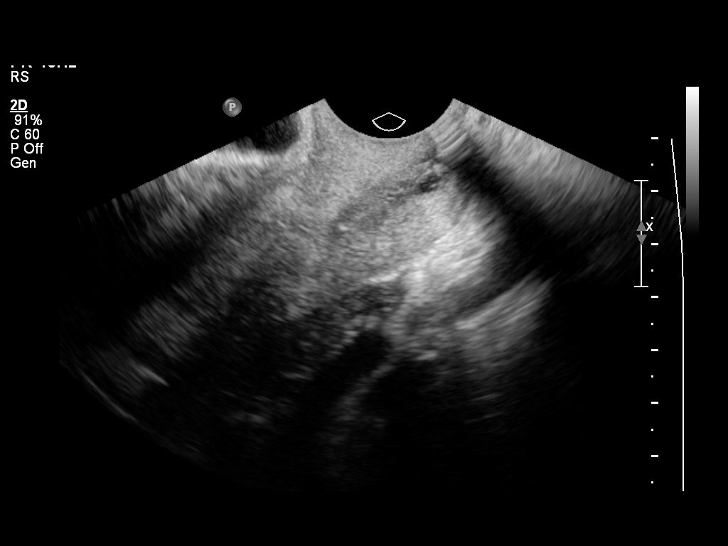

[13 of 25 positions shown; findings below may reference images not displayed]

It was necessary to proceed with endovaginal exam following the
transabdomnial exam to visualize the endometrium and ovaries.
FINDINGS: Uterus: Straits a sagittal length of 8.0 cm, AP depth of 5.4 cm and
a transverse width of 4.8 cm.  A focal fibroid is seen in the
anterior fundal region with a partial subserosal component
measuring 3.7 x 2.8 x 3.3 cm. This is slightly smaller than on the
prior exam at which time it measured approximately 4 x 3.5 cm. The
myometrium is otherwise homogeneous.

Endometrium: Is homogeneously echogenic with an AP width of 7.2 3
mm.  No areas of focal thickening or heterogeneity are seen and
this would correlate with a presecretory endometrial stripe and
correspond with the given LMP of 05/26/2010.

Right ovary:  Has a normal appearance measuring 2.6 x 1.6 x 2.2 cm

Left ovary: Has a normal appearance measuring 2.7 x 1.2 x 1.8 cm

Other findings: No pelvic fluid or separate adnexal masses are
seen.
IMPRESSION: Fibroid uterus with focal fibroid size and location as noted above.
This appears slightly smaller than on the prior exam from 6887.

Normal endometrium and ovaries.

## 2013-03-17 ENCOUNTER — Telehealth: Payer: Self-pay | Admitting: Licensed Clinical Social Worker

## 2013-03-17 NOTE — Telephone Encounter (Signed)
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 Left patient a message asking her to call us.

## 2013-04-10 ENCOUNTER — Other Ambulatory Visit: Payer: 59

## 2013-04-10 ENCOUNTER — Other Ambulatory Visit: Payer: Self-pay | Admitting: *Deleted

## 2013-04-10 DIAGNOSIS — B20 Human immunodeficiency virus [HIV] disease: Secondary | ICD-10-CM

## 2013-04-10 DIAGNOSIS — I10 Essential (primary) hypertension: Secondary | ICD-10-CM

## 2013-04-10 DIAGNOSIS — Z113 Encounter for screening for infections with a predominantly sexual mode of transmission: Secondary | ICD-10-CM

## 2013-04-10 LAB — COMPLETE METABOLIC PANEL WITH GFR
ALBUMIN: 3.8 g/dL (ref 3.5–5.2)
ALT: 14 U/L (ref 0–35)
AST: 12 U/L (ref 0–37)
Alkaline Phosphatase: 115 U/L (ref 39–117)
BILIRUBIN TOTAL: 0.4 mg/dL (ref 0.2–1.2)
BUN: 16 mg/dL (ref 6–23)
CHLORIDE: 101 meq/L (ref 96–112)
CO2: 26 meq/L (ref 19–32)
Calcium: 9.1 mg/dL (ref 8.4–10.5)
Creat: 0.71 mg/dL (ref 0.50–1.10)
Glucose, Bld: 384 mg/dL — ABNORMAL HIGH (ref 70–99)
Potassium: 4.2 mEq/L (ref 3.5–5.3)
SODIUM: 136 meq/L (ref 135–145)
Total Protein: 7.1 g/dL (ref 6.0–8.3)

## 2013-04-10 LAB — CBC
HCT: 41.5 % (ref 36.0–46.0)
HEMOGLOBIN: 13.6 g/dL (ref 12.0–15.0)
MCH: 29 pg (ref 26.0–34.0)
MCHC: 32.8 g/dL (ref 30.0–36.0)
MCV: 88.5 fL (ref 78.0–100.0)
Platelets: 322 10*3/uL (ref 150–400)
RBC: 4.69 MIL/uL (ref 3.87–5.11)
RDW: 14.2 % (ref 11.5–15.5)
WBC: 4.6 10*3/uL (ref 4.0–10.5)

## 2013-04-10 MED ORDER — AMLODIPINE BESYLATE 5 MG PO TABS: 5.0000 mg | ORAL_TABLET | Freq: Every day | ORAL | Status: DC

## 2013-04-10 MED ORDER — EFAVIRENZ-EMTRICITAB-TENOFOVIR 600-200-300 MG PO TABS: ORAL_TABLET | ORAL | Status: DC

## 2013-04-10 NOTE — Telephone Encounter (Signed)
Patient came into the office, stated she would like her prescriptions to go to [facility].  Sent today. Landis Gandy, RN

## 2013-04-11 LAB — T-HELPER CELL (CD4) - (RCID CLINIC ONLY)
CD4 % Helper T Cell: 26 % — ABNORMAL LOW (ref 33–55)
CD4 T CELL ABS: 500 /uL (ref 400–2700)

## 2013-04-11 LAB — HIV-1 RNA QUANT-NO REFLEX-BLD
HIV 1 RNA Quant: <20 {copies}/mL
HIV-1 RNA Quant, Log: <1.3 {Log}

## 2013-04-24 ENCOUNTER — Other Ambulatory Visit: Payer: Self-pay

## 2013-04-24 ENCOUNTER — Ambulatory Visit (INDEPENDENT_AMBULATORY_CARE_PROVIDER_SITE_OTHER): Payer: 59 | Admitting: Infectious Diseases

## 2013-04-24 ENCOUNTER — Ambulatory Visit (HOSPITAL_COMMUNITY)
Admission: RE | Admit: 2013-04-24 | Discharge: 2013-04-24 | Disposition: A | Discharge status: Home / Self Care | Payer: 59 | Source: Ambulatory Visit | Attending: Infectious Diseases | Admitting: Infectious Diseases

## 2013-04-24 ENCOUNTER — Encounter: Payer: Self-pay | Admitting: Infectious Diseases

## 2013-04-24 VITALS — BP 143/91 | HR 82 | Temp 98.3°F | Ht 63.5 in | Wt 224.0 lb

## 2013-04-24 DIAGNOSIS — R739 Hyperglycemia, unspecified: Secondary | ICD-10-CM

## 2013-04-24 DIAGNOSIS — I10 Essential (primary) hypertension: Secondary | ICD-10-CM

## 2013-04-24 DIAGNOSIS — R079 Chest pain, unspecified: Secondary | ICD-10-CM

## 2013-04-24 DIAGNOSIS — R7309 Other abnormal glucose: Secondary | ICD-10-CM

## 2013-04-24 DIAGNOSIS — B20 Human immunodeficiency virus [HIV] disease: Secondary | ICD-10-CM

## 2013-04-24 LAB — HEMOGLOBIN A1C
Hgb A1c MFr Bld: 13.5 % — ABNORMAL HIGH (ref <5.7)
Mean Plasma Glucose: 341 mg/dL — ABNORMAL HIGH (ref <117)

## 2013-04-24 NOTE — Assessment & Plan Note (Signed)
Improved on norvasc. Will have her seen by IM.

## 2013-04-24 NOTE — Assessment & Plan Note (Signed)
Will have her seen by IM. Probably causing her vaginal itching.

## 2013-04-24 NOTE — Assessment & Plan Note (Addendum)
She is doing well, will continue her art. She is developing meatbolic issues, ? Related to ART.  Will have her seen by IM. Will check her Hep A at next visit. Has gotten first shot, not clear she has indication for series (not hep B/C).  rtc 3-4 months.

## 2013-04-24 NOTE — Progress Notes (Signed)
   Subjective:    Patient ID: Alexis Frock, female    DOB: 1970/05/22, 43 y.o.   MRN: [id number]  HPI 43 yo F with hx of HTN and HIV+. At her last visit was started on norvasc. Also of note is that her last to Glc have been > 300.  Still having vaginal itching, no d/c.  C/o fatigue today. Also having "muscle pulls" in her LE.   HIV 1 RNA Quant (copies/mL)  Date Value  04/10/2013 <20   01/16/2013 <20   09/14/2012 <20      CD4 T Cell Abs (/uL)  Date Value  04/10/2013 500   01/16/2013 440   09/14/2012 500        Review of Systems  Constitutional: Negative for appetite change and unexpected weight change.  Respiratory: Negative for shortness of breath.   Cardiovascular: Positive for chest pain.  Gastrointestinal: Negative for diarrhea and constipation.  Genitourinary: Negative for difficulty urinating.  Neurological: Positive for headaches.  had episode of CP last week while at work while walking. Resolved after a few minutes. with rest. No radiation/nausea/numbness.      Objective:   Physical Exam  Constitutional: She appears well-developed and well-nourished.  HENT:  Mouth/Throat: No oropharyngeal exudate.  Eyes: EOM are normal. Pupils are equal, round, and reactive to light.  Neck: Neck supple.  Cardiovascular: Normal rate, regular rhythm and normal heart sounds.   Pulmonary/Chest: Effort normal and breath sounds normal.  Abdominal: Soft. Bowel sounds are normal. She exhibits no distension. There is no tenderness.  Lymphadenopathy:    She has no cervical adenopathy.          Assessment & Plan:

## 2013-04-24 NOTE — Assessment & Plan Note (Signed)
Suspect atypical but she has multiple risk factors. Will have her eval by CV.  Her ECG is NSR 78 bpm, septal infarct of ? Age.

## 2013-04-25 ENCOUNTER — Telehealth: Payer: Self-pay | Admitting: *Deleted

## 2013-04-25 NOTE — Telephone Encounter (Signed)
Called patient and notified her of appt with [facility] for Monday 05/01/13 at 8:15 AM. She is familiar with this office and it was stressed to her the importance of keeping this appointment. Katrina Parker

## 2013-05-01 ENCOUNTER — Encounter: Payer: Self-pay | Admitting: Internal Medicine

## 2013-05-01 ENCOUNTER — Ambulatory Visit: Payer: 59 | Admitting: Internal Medicine

## 2013-05-01 ENCOUNTER — Ambulatory Visit: Payer: 59 | Admitting: Dietician

## 2013-05-04 ENCOUNTER — Telehealth: Payer: Self-pay | Admitting: *Deleted

## 2013-05-04 NOTE — Telephone Encounter (Signed)
Received message from IM that patient did not keep her new appt for 05/01/13 with new PCP and Barnabas Harries for diabetic teaching. Sent email to Susette Racer, manager to ask if patient could be rescheduled. Myrtis Hopping

## 2013-05-17 ENCOUNTER — Ambulatory Visit: Payer: 59 | Admitting: Cardiology

## 2013-05-19 ENCOUNTER — Ambulatory Visit: Payer: 59 | Admitting: Cardiology

## 2013-05-26 ENCOUNTER — Encounter: Payer: Self-pay | Admitting: Cardiology

## 2013-08-14 ENCOUNTER — Other Ambulatory Visit: Payer: 59

## 2013-08-14 ENCOUNTER — Other Ambulatory Visit: Payer: Self-pay | Admitting: Infectious Diseases

## 2013-08-14 DIAGNOSIS — B20 Human immunodeficiency virus [HIV] disease: Secondary | ICD-10-CM

## 2013-08-14 LAB — CBC
HEMATOCRIT: 43.7 % (ref 36.0–46.0)
HEMOGLOBIN: 14.1 g/dL (ref 12.0–15.0)
MCH: 27.9 pg (ref 26.0–34.0)
MCHC: 32.3 g/dL (ref 30.0–36.0)
MCV: 86.5 fL (ref 78.0–100.0)
PLATELETS: 314 10*3/uL (ref 150–400)
RBC: 5.05 MIL/uL (ref 3.87–5.11)
RDW: 14 % (ref 11.5–15.5)
WBC: 4.1 10*3/uL (ref 4.0–10.5)

## 2013-08-15 LAB — COMPLETE METABOLIC PANEL WITH GFR
ALT: 15 U/L (ref 0–35)
AST: 14 U/L (ref 0–37)
Albumin: 4.2 g/dL (ref 3.5–5.2)
Alkaline Phosphatase: 109 U/L (ref 39–117)
BUN: 16 mg/dL (ref 6–23)
CO2: 26 meq/L (ref 19–32)
CREATININE: 0.69 mg/dL (ref 0.50–1.10)
Calcium: 9.6 mg/dL (ref 8.4–10.5)
Chloride: 102 mEq/L (ref 96–112)
GLUCOSE: 364 mg/dL — AB (ref 70–99)
Potassium: 4.3 mEq/L (ref 3.5–5.3)
Sodium: 137 mEq/L (ref 135–145)
Total Bilirubin: 0.5 mg/dL (ref 0.2–1.2)
Total Protein: 7.6 g/dL (ref 6.0–8.3)

## 2013-08-15 LAB — T-HELPER CELL (CD4) - (RCID CLINIC ONLY)
CD4 T CELL HELPER: 21 % — AB (ref 33–55)
CD4 T Cell Abs: 380 /uL — ABNORMAL LOW (ref 400–2700)

## 2013-08-15 LAB — HEPATITIS A ANTIBODY, TOTAL: HEP A TOTAL AB: REACTIVE — AB

## 2013-08-16 ENCOUNTER — Telehealth: Payer: Self-pay | Admitting: *Deleted

## 2013-08-16 ENCOUNTER — Other Ambulatory Visit: Payer: Self-pay | Admitting: Internal Medicine

## 2013-08-16 DIAGNOSIS — B20 Human immunodeficiency virus [HIV] disease: Secondary | ICD-10-CM

## 2013-08-16 LAB — HIV-1 RNA QUANT-NO REFLEX-BLD
HIV 1 RNA Quant: 27529 copies/mL — ABNORMAL HIGH (ref <20)
HIV-1 RNA Quant, Log: 4.44 {Log} — ABNORMAL HIGH (ref <1.30)

## 2013-08-16 NOTE — Telephone Encounter (Signed)
Added genotype to her lab visit, called in order to [facility].  Spoke with patient, she stated she is taking her medication.  RN advised her to hold Atripla until her follow up with Dr. Johnnye Sima.  She verbalized understanding. Landis Gandy, RN

## 2013-08-16 NOTE — Telephone Encounter (Signed)
Message copied by Landis Gandy on Wed Aug 16, 2013 11:55 AM ------      Message from: Thayer Headings      Created: Wed Aug 16, 2013 11:14 AM       Please add on genotype and have her stop the Atripla until she sees Dr. Johnnye Sima.  She is either not on it now or resistant.  thanks ------

## 2013-08-16 NOTE — Addendum Note (Signed)
Addended by: Landis Gandy on: 08/16/2013 11:57 AM   Modules accepted: Orders

## 2013-08-28 LAB — HIV-1 GENOTYPR PLUS

## 2013-08-30 ENCOUNTER — Encounter: Payer: Self-pay | Admitting: Infectious Diseases

## 2013-08-30 ENCOUNTER — Ambulatory Visit (INDEPENDENT_AMBULATORY_CARE_PROVIDER_SITE_OTHER): Payer: 59 | Admitting: Infectious Diseases

## 2013-08-30 VITALS — BP 155/97 | HR 86 | Temp 98.4°F | Ht 64.0 in | Wt 221.0 lb

## 2013-08-30 DIAGNOSIS — R739 Hyperglycemia, unspecified: Secondary | ICD-10-CM

## 2013-08-30 DIAGNOSIS — N760 Acute vaginitis: Secondary | ICD-10-CM

## 2013-08-30 DIAGNOSIS — B20 Human immunodeficiency virus [HIV] disease: Secondary | ICD-10-CM

## 2013-08-30 DIAGNOSIS — Z23 Encounter for immunization: Secondary | ICD-10-CM

## 2013-08-30 DIAGNOSIS — R7309 Other abnormal glucose: Secondary | ICD-10-CM

## 2013-08-30 MED ORDER — FLUCONAZOLE 100 MG PO TABS: 100.0000 mg | ORAL_TABLET | Freq: Every day | ORAL | Status: DC

## 2013-08-30 NOTE — Assessment & Plan Note (Signed)
Will give her course of diflucan.

## 2013-08-30 NOTE — Progress Notes (Signed)
   Subjective:    Patient ID: Katrina Parker, female    DOB: 08-Nov-1970, 43 y.o.   MRN: [id number]  HPI 43 yo F with hx of HTN, DM and HIV+.Has been on atripla.  Had recent increase in HIV RNA, genotype naive.  Has not been able to make IM f/u for her DM care. Occasionally misses her anti-htn rx.  Has been having yeast infections, GYN. Needs repeat PAP 09-2013.   HIV 1 RNA Quant (copies/mL)  Date Value  08/14/2013 27529*  04/10/2013 <20   01/16/2013 <20      CD4 T Cell Abs (/uL)  Date Value  08/14/2013 380*  04/10/2013 500   01/16/2013 440    Lab Results  Component Value Date   HEPBSAB POS* 09/08/2006  Hep A Ab+ as well.   Review of Systems  Constitutional: Negative for appetite change and unexpected weight change.  Gastrointestinal: Negative for diarrhea and constipation.  Genitourinary: Positive for vaginal discharge. Negative for difficulty urinating.  Neurological: Negative for numbness.       Objective:   Physical Exam  Constitutional: She appears well-developed and well-nourished.  HENT:  Mouth/Throat: No oropharyngeal exudate.  Eyes: EOM are normal. Pupils are equal, round, and reactive to light.  Neck: Neck supple.  Cardiovascular: Normal rate, regular rhythm and normal heart sounds.   Pulmonary/Chest: Effort normal and breath sounds normal.  Abdominal: Soft. Bowel sounds are normal. She exhibits no distension. There is no tenderness.  Musculoskeletal:       Feet:  Lymphadenopathy:    She has no cervical adenopathy.          Assessment & Plan:

## 2013-08-30 NOTE — Assessment & Plan Note (Signed)
Will try to get her PCP f/u.

## 2013-08-30 NOTE — Assessment & Plan Note (Signed)
Will repeat her HIV RNA today, check genotype, integrase, HLA.  She swears adherence. She is offered, refuses condoms. Will see her back in 2-3 weeks. She gets flu shot today. Continue atripla for now.  

## 2013-09-01 LAB — HIV-1 RNA ULTRAQUANT REFLEX TO GENTYP+
HIV 1 RNA Quant: 197 copies/mL — ABNORMAL HIGH (ref <20)
HIV-1 RNA QUANT, LOG: 2.29 {Log} — AB (ref <1.30)

## 2013-09-06 LAB — HLA B*5701: HLA-B*5701 w/rflx HLA-B High: NEGATIVE

## 2013-09-07 LAB — HIV-1 INTEGRASE GENOTYPE

## 2013-09-11 ENCOUNTER — Telehealth: Payer: Self-pay | Admitting: *Deleted

## 2013-09-11 ENCOUNTER — Ambulatory Visit: Payer: 59 | Admitting: Infectious Diseases

## 2013-09-11 DIAGNOSIS — B20 Human immunodeficiency virus [HIV] disease: Secondary | ICD-10-CM

## 2013-09-11 NOTE — Telephone Encounter (Signed)
Patient no-showed today, states she forgot.  Per Dr. Johnnye Sima, gave her a lab appointment for tomorrow (orders in place).  When patient comes for her labs, she will need to be reminded of her appointment Wednesday 9/2 at [facility] where she is to establish care for uncontrolled hypertension and diabetes. Landis Gandy, RN

## 2013-09-12 ENCOUNTER — Other Ambulatory Visit: Payer: 59

## 2013-09-12 ENCOUNTER — Other Ambulatory Visit: Payer: Self-pay | Admitting: Infectious Diseases

## 2013-09-12 DIAGNOSIS — B20 Human immunodeficiency virus [HIV] disease: Secondary | ICD-10-CM

## 2013-09-13 ENCOUNTER — Encounter: Payer: Self-pay | Admitting: Family Medicine

## 2013-09-13 ENCOUNTER — Ambulatory Visit (INDEPENDENT_AMBULATORY_CARE_PROVIDER_SITE_OTHER): Payer: 59 | Admitting: Family Medicine

## 2013-09-13 VITALS — BP 160/94 | HR 76 | Temp 98.0°F | Resp 16 | Ht 62.0 in | Wt 222.0 lb

## 2013-09-13 DIAGNOSIS — Z8742 Personal history of other diseases of the female genital tract: Secondary | ICD-10-CM

## 2013-09-13 DIAGNOSIS — I10 Essential (primary) hypertension: Secondary | ICD-10-CM

## 2013-09-13 DIAGNOSIS — IMO0002 Reserved for concepts with insufficient information to code with codable children: Secondary | ICD-10-CM | POA: Insufficient documentation

## 2013-09-13 DIAGNOSIS — E669 Obesity, unspecified: Secondary | ICD-10-CM

## 2013-09-13 DIAGNOSIS — Z1231 Encounter for screening mammogram for malignant neoplasm of breast: Secondary | ICD-10-CM

## 2013-09-13 DIAGNOSIS — E119 Type 2 diabetes mellitus without complications: Secondary | ICD-10-CM | POA: Insufficient documentation

## 2013-09-13 DIAGNOSIS — E785 Hyperlipidemia, unspecified: Secondary | ICD-10-CM

## 2013-09-13 DIAGNOSIS — IMO0001 Reserved for inherently not codable concepts without codable children: Secondary | ICD-10-CM

## 2013-09-13 DIAGNOSIS — B20 Human immunodeficiency virus [HIV] disease: Secondary | ICD-10-CM

## 2013-09-13 DIAGNOSIS — Z86018 Personal history of other benign neoplasm: Secondary | ICD-10-CM

## 2013-09-13 DIAGNOSIS — E1165 Type 2 diabetes mellitus with hyperglycemia: Secondary | ICD-10-CM | POA: Insufficient documentation

## 2013-09-13 LAB — HIV-1 RNA ULTRAQUANT REFLEX TO GENTYP+
HIV 1 RNA QUANT: 254 {copies}/mL — AB (ref <20)
HIV-1 RNA Quant, Log: 2.4 {Log} — ABNORMAL HIGH (ref <1.30)

## 2013-09-13 LAB — LIPID PANEL
Cholesterol: 176 mg/dL (ref 0–200)
HDL: 56 mg/dL (ref >39)
LDL CALC: 98 mg/dL (ref 0–99)
Total CHOL/HDL Ratio: 3.1 Ratio
Triglycerides: 112 mg/dL (ref <150)
VLDL: 22 mg/dL (ref 0–40)

## 2013-09-13 MED ORDER — AMLODIPINE BESYLATE 10 MG PO TABS: 10.0000 mg | ORAL_TABLET | Freq: Every day | ORAL | Status: DC

## 2013-09-13 NOTE — Patient Instructions (Signed)
DASH Eating Plan DASH stands for "Dietary Approaches to Stop Hypertension." The DASH eating plan is a healthy eating plan that has been shown to reduce high blood pressure (hypertension). Additional health benefits may include reducing the risk of type 2 diabetes mellitus, heart disease, and stroke. The DASH eating plan may also help with weight loss. WHAT DO I NEED TO KNOW ABOUT THE DASH EATING PLAN? For the DASH eating plan, you will follow these general guidelines:  Choose foods with a percent daily value for sodium of less than 5% (as listed on the food label).  Use salt-free seasonings or herbs instead of table salt or sea salt.  Check with your health care provider or pharmacist before using salt substitutes.  Eat lower-sodium products, often labeled as "lower sodium" or "no salt added."  Eat fresh foods.  Eat more vegetables, fruits, and low-fat dairy products.  Choose whole grains. Look for the word "whole" as the first word in the ingredient list.  Choose fish and skinless chicken or turkey more often than red meat. Limit fish, poultry, and meat to 6 oz (170 g) each day.  Limit sweets, desserts, sugars, and sugary drinks.  Choose heart-healthy fats.  Limit cheese to 1 oz (28 g) per day.  Eat more home-cooked food and less restaurant, buffet, and fast food.  Limit fried foods.  Cook foods using methods other than frying.  Limit canned vegetables. If you do use them, rinse them well to decrease the sodium.  When eating at a restaurant, ask that your food be prepared with less salt, or no salt if possible. WHAT FOODS CAN I EAT? Seek help from a dietitian for individual calorie needs. Grains Whole grain or whole wheat bread. Brown rice. Whole grain or whole wheat pasta. Quinoa, bulgur, and whole grain cereals. Low-sodium cereals. Corn or whole wheat flour tortillas. Whole grain cornbread. Whole grain crackers. Low-sodium crackers. Vegetables Fresh or frozen vegetables  (raw, steamed, roasted, or grilled). Low-sodium or reduced-sodium tomato and vegetable juices. Low-sodium or reduced-sodium tomato sauce and paste. Low-sodium or reduced-sodium canned vegetables.  Fruits All fresh, canned (in natural juice), or frozen fruits. Meat and Other Protein Products Ground beef (85% or leaner), grass-fed beef, or beef trimmed of fat. Skinless chicken or turkey. Ground chicken or turkey. Pork trimmed of fat. All fish and seafood. Eggs. Dried beans, peas, or lentils. Unsalted nuts and seeds. Unsalted canned beans. Dairy Low-fat dairy products, such as skim or 1% milk, 2% or reduced-fat cheeses, low-fat ricotta or cottage cheese, or plain low-fat yogurt. Low-sodium or reduced-sodium cheeses. Fats and Oils Tub margarines without trans fats. Light or reduced-fat mayonnaise and salad dressings (reduced sodium). Avocado. Safflower, olive, or canola oils. Natural peanut or almond butter. Other Unsalted popcorn and pretzels. The items listed above may not be a complete list of recommended foods or beverages. Contact your dietitian for more options. WHAT FOODS ARE NOT RECOMMENDED? Grains White bread. White pasta. White rice. Refined cornbread. Bagels and croissants. Crackers that contain trans fat. Vegetables Creamed or fried vegetables. Vegetables in a cheese sauce. Regular canned vegetables. Regular canned tomato sauce and paste. Regular tomato and vegetable juices. Fruits Dried fruits. Canned fruit in light or heavy syrup. Fruit juice. Meat and Other Protein Products Fatty cuts of meat. Ribs, chicken wings, bacon, sausage, bologna, salami, chitterlings, fatback, hot dogs, bratwurst, and packaged luncheon meats. Salted nuts and seeds. Canned beans with salt. Dairy Whole or 2% milk, cream, half-and-half, and cream cheese. Whole-fat or sweetened yogurt. Full-fat   cheeses or blue cheese. Nondairy creamers and whipped toppings. Processed cheese, cheese spreads, or cheese  curds. Condiments Onion and garlic salt, seasoned salt, table salt, and sea salt. Canned and packaged gravies. Worcestershire sauce. Tartar sauce. Barbecue sauce. Teriyaki sauce. Soy sauce, including reduced sodium. Steak sauce. Fish sauce. Oyster sauce. Cocktail sauce. Horseradish. Ketchup and mustard. Meat flavorings and tenderizers. Bouillon cubes. Hot sauce. Tabasco sauce. Marinades. Taco seasonings. Relishes. Fats and Oils Butter, stick margarine, lard, shortening, ghee, and bacon fat. Coconut, palm kernel, or palm oils. Regular salad dressings. Other Pickles and olives. Salted popcorn and pretzels. The items listed above may not be a complete list of foods and beverages to avoid. Contact your dietitian for more information. WHERE CAN I FIND MORE INFORMATION? National Heart, Lung, and Blood Institute: travelstabloid.com Document Released: 12/18/2010 Document Revised: 05/15/2013 Document Reviewed: 11/02/2012 [facility] Patient Information 2015 [place], [place]. This information is not intended to replace advice given to you by your health care provider. Make sure you discuss any questions you have with your health care provider. [place] yogurt (low sugar, high protein)

## 2013-09-13 NOTE — Progress Notes (Signed)
Subjective:    Patient ID: Katrina Parker, female    DOB: [birth year], 43 y.o.   MRN: [id number]  HPI Patient reports that she is in he office to establish care. She states that she has been followed by Dr. Johnnye Sima for HIV. She says that she was diagnosed 7 years ago after moving from [place]. She reports that she takes HIV medications consistently. She currently denies fatigue, headache, fever, nausea, vomiting, skin eruptions, nausea, vomiting, or diarrhea. She states that she has missed the last few appointments due to work constraints.   Patient states that she has a history of hypertension. She states that she has been forgetting to take hypertension medications frequently, since she primarily takes it after work. She states that she does not exercise or follow a balanced diet. Katrina Parker denies chest pains, dizziness, edema, shortness of breath, orthopnea, or weakness.     Review of Systems  Constitutional: Negative.   HENT: Negative.   Respiratory: Negative.   Cardiovascular: Negative.   Gastrointestinal: Negative.   Endocrine: Positive for polyuria. Negative for polydipsia and polyphagia.  Genitourinary: Negative.  Negative for dysuria and difficulty urinating.       She reports occasional vaginal itching.   Musculoskeletal: Negative.   Skin: Negative.   Allergic/Immunologic: Negative.   Neurological: Negative.   Hematological: Negative.   Psychiatric/Behavioral: Negative.        Objective:   Physical Exam  Constitutional: She is oriented to person, place, and time. She appears well-developed and well-nourished. No distress.  HENT:  Head: Normocephalic and atraumatic.  Right Ear: External ear normal.  Left Ear: External ear normal.  Mouth/Throat: Oropharynx is clear and moist.  Eyes: Conjunctivae and EOM are normal. Pupils are equal, round, and reactive to light.  Neck: Normal range of motion. Neck supple.  Cardiovascular: Normal rate, regular rhythm, normal heart sounds  and intact distal pulses.   Pulmonary/Chest: Effort normal and breath sounds normal.  Abdominal: Soft. Bowel sounds are normal. She exhibits no distension. There is no tenderness.  Musculoskeletal: Normal range of motion.  Neurological: She is alert and oriented to person, place, and time. She has normal reflexes.  Skin: Skin is warm and dry. She is not diaphoretic.  Psychiatric: She has a normal mood and affect. Her behavior is normal. Judgment and thought content normal.         BP 151/95  Pulse 76  Temp(Src) 98 F (36.7 C) (Oral)  Resp 16  Ht 5\' 2"  (1.575 m)  Wt 222 lb (100.699 kg)  BMI 40.59 kg/m2  LMP 06/01/2011 Assessment & Plan:  1.Essential hypertension, benign Manual re-check 160/90. Will increase Norvasc to 10 mg. Recommend that patient take medication in the am to aid in adherence to medication regimen.   amLODipine (NORVASC) 10 MG tablet; Take 1 tablet (10 mg total) by mouth daily.  Dispense: 30 tablet; Refill: 11   2. Type II or unspecified type diabetes mellitus without mention of complication, uncontrolled Laboratory values from last March indicated that her hemoglobin a1C was 13.5. She states that she did not realize that she had diabetes. She has not been started on anti-diabetic medications up until this point. She states that she primarily eats [place] foods including fried plantains, rice, and bananas. We discussed diet at length.  - Hemoglobin A1c - Lipid Panel - Ambulatory referral to diabetic education - Urinalysis, Complete - Glucose (CBG)  3. HIV DISEASE Stable: Katrina. Bracknell is followed by Dr. Johnnye Sima. She reports that her next  appointment is in November. Reviewed recently laboratory values.   4. Obesity, unspecified Start 1800 calorie lowfat, low sodium diet divided over 6 small meals. Referred to diabetic education. Patient reports that she is not sure what she needs to eat at this point. Start daily walking regimen, 30 minutes per day 3 days per week  and increase water intake to 6-8 glasses daily   5. Other and unspecified hyperlipidemia Recommend lowfat, low carbohydrate diet. Will also check lipid levels  6. Other screening mammogram Last Mammogram 10/21/2012. Will send referral for mammogram in 1 month.  - MM Digital Diagnostic Bilateral Mammogram; Future    6. History of uterine fibroids  Katrina Parker state states that she was diagnosed with uterine fibroids in 2012. She states that she did not follow up after diagnosis. Discussed that she needs to schedule a follow-up appointment with [facility]  Preventative care:  Immunizations up to date   RTC: Will follow up in 1 month with Dr. Zigmund Daniel for DMII and hypertension Dorena Dew, FNP

## 2013-09-14 ENCOUNTER — Telehealth: Payer: Self-pay | Admitting: Family Medicine

## 2013-09-14 DIAGNOSIS — IMO0001 Reserved for inherently not codable concepts without codable children: Secondary | ICD-10-CM

## 2013-09-14 DIAGNOSIS — E1165 Type 2 diabetes mellitus with hyperglycemia: Principal | ICD-10-CM

## 2013-09-14 LAB — URINALYSIS, COMPLETE
Bacteria, UA: NONE SEEN
Bilirubin Urine: NEGATIVE
Casts: NONE SEEN
Crystals: NONE SEEN
Glucose, UA: >1000 mg/dL — AB
Hgb urine dipstick: NEGATIVE
Ketones, ur: NEGATIVE mg/dL
LEUKOCYTES UA: NEGATIVE
NITRITE: NEGATIVE
PROTEIN: NEGATIVE mg/dL
SQUAMOUS EPITHELIAL / LPF: NONE SEEN
Specific Gravity, Urine: >1.03 — ABNORMAL HIGH (ref 1.005–1.030)
UROBILINOGEN UA: 0.2 mg/dL (ref 0.0–1.0)
pH: 6 (ref 5.0–8.0)

## 2013-09-14 LAB — HEMOGLOBIN A1C
HEMOGLOBIN A1C: 13.4 % — AB (ref <5.7)
MEAN PLASMA GLUCOSE: 338 mg/dL — AB (ref <117)

## 2013-09-14 MED ORDER — FREESTYLE SYSTEM KIT: 1.0000 | PACK | Freq: Two times a day (BID) | Status: AC

## 2013-09-14 MED ORDER — INSULIN GLARGINE 100 UNIT/ML SOLOSTAR PEN: 10.0000 [IU] | PEN_INJECTOR | Freq: Every day | SUBCUTANEOUS | Status: DC

## 2013-09-14 MED ORDER — METFORMIN HCL 500 MG PO TABS: 500.0000 mg | ORAL_TABLET | Freq: Two times a day (BID) | ORAL | Status: DC

## 2013-09-14 NOTE — Telephone Encounter (Signed)
Meds ordered this encounter  Medications  . metFORMIN (GLUCOPHAGE) 500 MG tablet    Sig: Take 1 tablet (500 mg total) by mouth 2 (two) times daily with a meal.    Dispense:  60 tablet    Refill:  3    Order Specific Question:  Supervising Provider    Answer:  MATTHEWS, MICHELLE A [[id number]]  . Insulin Glargine (LANTUS SOLOSTAR) 100 UNIT/ML Solostar Pen    Sig: Inject 10 Units into the skin daily at 10 pm.    Dispense:  5 pen    Refill:  PRN    Order Specific Question:  Supervising Provider    Answer:  MATTHEWS, MICHELLE A [[id number]]  . glucose monitoring kit (FREESTYLE) monitoring kit    Sig: 1 each by Does not apply route 2 (two) times daily at 8 am and 10 pm.    Dispense:  1 each    Refill:  0    Order Specific Question:  Supervising Provider    Answer:  Liston Alba A [[id number]]       Reviewed laboratory values. Hemoglobin a1C is 13.4. Patient is to start anti-diabetic medications and check blood sugars twice daily (once before breakfast and the other prior to dinner). Will schedule a follow-up in 1 week. She is to bring  glucometer and diary to follow-up appointment.

## 2013-09-15 ENCOUNTER — Other Ambulatory Visit: Payer: Self-pay | Admitting: Infectious Diseases

## 2013-09-15 ENCOUNTER — Telehealth: Payer: Self-pay

## 2013-09-15 DIAGNOSIS — B20 Human immunodeficiency virus [HIV] disease: Secondary | ICD-10-CM

## 2013-09-15 NOTE — Telephone Encounter (Signed)
Called and advised patient of new medications to pick up and start taking as prescribed. Also to come in for visit on Wednesday 09/20/2013 @ 1:15pm and to bring all medications including the new ones with her. Patient verbalized understanding.

## 2013-09-20 ENCOUNTER — Ambulatory Visit (INDEPENDENT_AMBULATORY_CARE_PROVIDER_SITE_OTHER): Payer: 59 | Admitting: Family Medicine

## 2013-09-20 VITALS — BP 131/79 | HR 96 | Temp 98.2°F | Resp 16 | Ht 62.0 in | Wt 224.0 lb

## 2013-09-20 DIAGNOSIS — Z7189 Other specified counseling: Secondary | ICD-10-CM

## 2013-09-20 DIAGNOSIS — IMO0001 Reserved for inherently not codable concepts without codable children: Secondary | ICD-10-CM

## 2013-09-20 DIAGNOSIS — E1165 Type 2 diabetes mellitus with hyperglycemia: Secondary | ICD-10-CM

## 2013-09-20 DIAGNOSIS — H538 Other visual disturbances: Secondary | ICD-10-CM

## 2013-09-20 DIAGNOSIS — E119 Type 2 diabetes mellitus without complications: Secondary | ICD-10-CM

## 2013-09-20 NOTE — Patient Instructions (Signed)
Diabetes Mellitus and Food It is important for you to manage your blood sugar (glucose) level. Your blood glucose level can be greatly affected by what you eat. Eating healthier foods in the appropriate amounts throughout the day at about the same time each day will help you control your blood glucose level. It can also help slow or prevent worsening of your diabetes mellitus. Healthy eating may even help you improve the level of your blood pressure and reach or maintain a healthy weight.  HOW CAN FOOD AFFECT ME? Carbohydrates Carbohydrates affect your blood glucose level more than any other type of food. Your dietitian will help you determine how many carbohydrates to eat at each meal and teach you how to count carbohydrates. Counting carbohydrates is important to keep your blood glucose at a healthy level, especially if you are using insulin or taking certain medicines for diabetes mellitus. Alcohol Alcohol can cause sudden decreases in blood glucose (hypoglycemia), especially if you use insulin or take certain medicines for diabetes mellitus. Hypoglycemia can be a life-threatening condition. Symptoms of hypoglycemia (sleepiness, dizziness, and disorientation) are similar to symptoms of having too much alcohol.  If your health care provider has given you approval to drink alcohol, do so in moderation and use the following guidelines:  Women should not have more than one drink per day, and men should not have more than two drinks per day. One drink is equal to:  12 oz of beer.  5 oz of wine.  1 oz of hard liquor.  Do not drink on an empty stomach.  Keep yourself hydrated. Have water, diet soda, or unsweetened iced tea.  Regular soda, juice, and other mixers might contain a lot of carbohydrates and should be counted. WHAT FOODS ARE NOT RECOMMENDED? As you make food choices, it is important to remember that all foods are not the same. Some foods have fewer nutrients per serving than other  foods, even though they might have the same number of calories or carbohydrates. It is difficult to get your body what it needs when you eat foods with fewer nutrients. Examples of foods that you should avoid that are high in calories and carbohydrates but low in nutrients include:  Trans fats (most processed foods list trans fats on the Nutrition Facts label).  Regular soda.  Juice.  Candy.  Sweets, such as cake, pie, doughnuts, and cookies.  Fried foods. WHAT FOODS CAN I EAT? Have nutrient-rich foods, which will nourish your body and keep you healthy. The food you should eat also will depend on several factors, including:  The calories you need.  The medicines you take.  Your weight.  Your blood glucose level.  Your blood pressure level.  Your cholesterol level. You also should eat a variety of foods, including:  Protein, such as meat, poultry, fish, tofu, nuts, and seeds (lean animal proteins are best).  Fruits.  Vegetables.  Dairy products, such as milk, cheese, and yogurt (low fat is best).  Breads, grains, pasta, cereal, rice, and beans.  Fats such as olive oil, trans fat-free margarine, canola oil, avocado, and olives. DOES EVERYONE WITH DIABETES MELLITUS HAVE THE SAME MEAL PLAN? Because every person with diabetes mellitus is different, there is not one meal plan that works for everyone. It is very important that you meet with a dietitian who will help you create a meal plan that is just right for you. Document Released: 09/25/2004 Document Revised: 01/03/2013 Document Reviewed: 11/25/2012 [employer] Patient Information 2015 [employer]. This   information is not intended to replace advice given to you by your health care provider. Make sure you discuss any questions you have with your health care provider. Insulin Glargine injection What is this medicine? INSULIN GLARGINE (IN su lin GLAR geen) is a human-made form of insulin. This drug lowers the amount of sugar  in your blood. It is a long-acting insulin that is usually given once a day. This medicine may be used for other purposes; ask your health care provider or pharmacist if you have questions. COMMON BRAND NAME(S): Lantus, Lantus SoloStar, Toujeo SoloStar What should I tell my health care provider before I take this medicine? They need to know if you have any of these conditions: -episodes of hypoglycemia -kidney disease -liver disease -an unusual or allergic reaction to insulin, metacresol, other medicines, foods, dyes, or preservatives -pregnant or trying to get pregnant -breast-feeding How should I use this medicine? This medicine is for injection under the skin. Use this medicine at the same time each day. Use exactly as directed. This insulin should never be mixed in the same syringe with other insulins before injection. Do not vigorously shake before use. You will be taught how to use this medicine and how to adjust doses for activities and illness. Do not use more insulin than prescribed. Always check the appearance of your insulin before using it. This medicine should be clear and colorless like water. Do not use it if it is cloudy, thickened, colored, or has solid particles in it. It is important that you put your used needles and syringes in a special sharps container. Do not put them in a trash can. If you do not have a sharps container, call your pharmacist or healthcare provider to get one. Talk to your pediatrician regarding the use of this medicine in children. Special care may be needed. Overdosage: If you think you have taken too much of this medicine contact a poison control center or emergency room at once. NOTE: This medicine is only for you. Do not share this medicine with others. What if I miss a dose? It is important not to miss a dose. Your health care professional or doctor should discuss a plan for missed doses with you. If you do miss a dose, follow their plan. Do not take  double doses. What may interact with this medicine? -other medicines for diabetes Many medications may cause an increase or decrease in blood sugar, these include: -alcohol containing beverages -aspirin and aspirin-like drugs -chloramphenicol -chromium -diuretics -female hormones, like estrogens or progestins and birth control pills -heart medicines -isoniazid -female hormones or anabolic steroids -medicines for weight loss -medicines for allergies, asthma, cold, or cough -medicines for mental problems -medicines called MAO Inhibitors like Nardil, Parnate, Marplan, Eldepryl -niacin -NSAIDs, medicines for pain and inflammation, like ibuprofen or naproxen -pentamidine -phenytoin -probenecid -quinolone antibiotics like ciprofloxacin, levofloxacin, ofloxacin -some herbal dietary supplements -steroid medicines like prednisone or cortisone -thyroid medicine Some medications can hide the warning symptoms of low blood sugar. You may need to monitor your blood sugar more closely if you are taking one of these medications. These include: -beta-blockers such as atenolol, metoprolol, propranolol -clonidine -guanethidine -reserpine This list may not describe all possible interactions. Give your health care provider a list of all the medicines, herbs, non-prescription drugs, or dietary supplements you use. Also tell them if you smoke, drink alcohol, or use illegal drugs. Some items may interact with your medicine. What should I watch for while using this medicine? Visit your  health care professional or doctor for regular checks on your progress. A test called the HbA1C (A1C) will be monitored. This is a simple blood test. It measures your blood sugar control over the last 2 to 3 months. You will receive this test every 3 to 6 months. Learn how to check your blood sugar. Learn the symptoms of low and high blood sugar and how to manage them. Always carry a quick-source of sugar with you in case you  have symptoms of low blood sugar. Examples include hard sugar candy or glucose tablets. Make sure others know that you can choke if you eat or drink when you develop serious symptoms of low blood sugar, such as seizures or unconsciousness. They must get medical help at once. Tell your doctor or health care professional if you have high blood sugar. You might need to change the dose of your medicine. If you are sick or exercising more than usual, you might need to change the dose of your medicine. Do not skip meals. Ask your doctor or health care professional if you should avoid alcohol. Many nonprescription cough and cold products contain sugar or alcohol. These can affect blood sugar. Make sure that you have the right kind of syringe for the type of insulin you use. Try not to change the brand and type of insulin or syringe unless your health care professional or doctor tells you to. Switching insulin brand or type can cause dangerously high or low blood sugar. Always keep an extra supply of insulin, syringes, and needles on hand. Use a syringe one time only. Throw away syringe and needle in a closed container to prevent accidental needle sticks. Insulin pens and cartridges should never be shared. Even if the needle is changed, sharing may result in passing of viruses like hepatitis or HIV. Wear a medical ID bracelet or chain, and carry a card that describes your disease and details of your medicine and dosage times. What side effects may I notice from receiving this medicine? Side effects that you should report to your health care professional or doctor as soon as possible: -allergic reactions like skin rash, itching or hives, swelling of the face, lips, or tongue -breathing problems -signs and symptoms of high blood sugar such as dizziness, dry mouth, dry skin, fruity breath, nausea, stomach pain, increased hunger or thirst, increased urination -signs and symptoms of low blood sugar such as feeling  anxious, confusion, dizziness, increased hunger, unusually weak or tired, sweating, shakiness, cold, irritable, headache, blurred vision, fast heartbeat, loss of consciousness Side effects that usually do not require medical attention (report to your health care professional or doctor if they continue or are bothersome): -increase or decrease in fatty tissue under the skin due to overuse of a particular injection site -itching, burning, swelling, or rash at site where injected This list may not describe all possible side effects. Call your doctor for medical advice about side effects. You may report side effects to FDA at 1-800-FDA-1088. Where should I keep my medicine? Keep out of the reach of children. Store unopened vials in a refrigerator between 2 and 8 degrees C (36 and 46 degrees F). Do not freeze or use if the insulin has been frozen. Opened vials (vials currently in use) may be stored in the refrigerator or at room temperature, at approximately 25 degrees C (77 degrees F) or cooler. Keeping your insulin at room temperature decreases the amount of pain during injection. Once opened, your insulin can be used for  28 days. After 28 days, the vial should be thrown away. Store unopened pen-injector cartridges in a refrigerator between 2 and 8 degrees C (36 and 46 degrees F.) Do not freeze or use if the insulin has been frozen. Insulin cartridges inserted into the [employer] system should be kept at room temperature, approximately 25 degrees C (77 degrees F) or cooler. Do not store in the refrigerator. Once inserted into the [employer] system, the insulin can be used for 28 days. After 28 days, the cartridge should be thrown away. Protect from light and excessive heat. Throw away any unused medicine after the expiration date or after the specified time for room temperature storage has passed. NOTE: This sheet is a summary. It may not cover all possible information. If you have questions about this medicine,  talk to your doctor, pharmacist, or health care provider.  2015, [employer]. (2013-03-09 10:13:56)

## 2013-09-20 NOTE — Progress Notes (Signed)
Subjective:    Patient ID: Katrina Parker, female    DOB: 04/11/1970, 42 y.o.   MRN: [id number]  HPI Katrina Parker presents for follow up of diabetes mellitus and new medication management. . Katrina Parker was notified about an increased hemoglobin A1C. She has had uncontrolled DMII for 1 year. She is currently not following a balanced diet and has not picked up anti-diabetic medications from the pharmacy. She was instructed to return with new medications so that we could review and demonstrate how to check daily blood sugars.  She reports periodic blurred vision, but denies headaches, weakness, neuropathy, polyuria, polydipsia, polyphagia, nausea, vomiting or diarrhea.    Outpatient Encounter Prescriptions as of 09/20/2013  Medication Sig  . amLODipine (NORVASC) 10 MG tablet Take 1 tablet (10 mg total) by mouth daily.  Marland Kitchen efavirenz-emtricitabine-tenofovir (ATRIPLA) 600-200-300 MG per tablet TAKE 1 TABLET BY MOUTH AT BEDTIME  . fluconazole (DIFLUCAN) 100 MG tablet Take 1 tablet (100 mg total) by mouth daily.  Marland Kitchen glucose monitoring kit (FREESTYLE) monitoring kit 1 each by Does not apply route 2 (two) times daily at 8 am and 10 pm.  . Insulin Glargine (LANTUS SOLOSTAR) 100 UNIT/ML Solostar Pen Inject 10 Units into the skin daily at 10 pm.  . metFORMIN (GLUCOPHAGE) 500 MG tablet Take 1 tablet (500 mg total) by mouth 2 (two) times daily with a meal.   Review of Systems  Constitutional: Negative.   Eyes: Positive for visual disturbance.  Cardiovascular: Negative.   Gastrointestinal: Negative.   Endocrine: Positive for polyuria. Negative for polydipsia and polyphagia.  Genitourinary: Negative.   Musculoskeletal: Negative.   Skin: Negative.   Neurological: Negative.   Hematological: Negative.   Psychiatric/Behavioral: Negative.        Objective:   Physical Exam  Constitutional: She is oriented to person, place, and time. She appears well-developed and well-nourished.  HENT:  Head: Normocephalic and  atraumatic.  Right Ear: External ear normal.  Left Ear: External ear normal.  Mouth/Throat: Oropharynx is clear and moist.  Eyes: Conjunctivae are normal. Pupils are equal, round, and reactive to light.  Neck: Normal range of motion. Neck supple.  Cardiovascular: Normal rate, regular rhythm, normal heart sounds and intact distal pulses.   Pulmonary/Chest: Effort normal and breath sounds normal.  Abdominal: Soft. Bowel sounds are normal.  Musculoskeletal: Normal range of motion.  Neurological: She is alert and oriented to person, place, and time.  Skin: Skin is warm and dry.  Psychiatric: She has a normal mood and affect. Her behavior is normal. Judgment and thought content normal.         BP 131/79  Pulse 96  Temp(Src) 98.2 F (36.8 C)  Resp 16  Ht 5' 2"  (1.575 m)  Wt 224 lb (101.606 kg)  BMI 40.96 kg/m2  LMP 06/01/2011 Assessment & Plan:  1. Blurred vision, bilateral Katrina Parker is complaining of periodic blurred vision. Patient denies headache, dizziness, fevers or weakness. DMII has been uncontrolled. Last hemoglobin a1C is 13.5.  - Ambulatory referral to Ophthalmology   2. Type II or unspecified type diabetes mellitus without mention of complication, uncontrolled [name] with low carbohydrate diet. Will send to [facility] education. She was also given written educational materials.  - Ambulatory referral to diabetic education  3. Diabetes education, encounter for Continue Metformin  500 mg twice daily Provided diabetes education on insulin injection. Patient was able to give initial injection of Lantus in clinic. She is to  administer Lantus every evening. She expressed understanding.  Also provided information on symptoms of hypo/hyperglycemia.  Patient is to check blood sugars twice daily. Patient did not bring in monitor, received a demonstration with office monitor expressed understanding. She is to maintain a blood sugar diary.       Katrina Parker M, FNP  Patient is to follow up in 1 month as scheduled with Dr. Zigmund Daniel for DMII

## 2013-09-21 ENCOUNTER — Encounter: Payer: Self-pay | Admitting: Family Medicine

## 2013-09-21 DIAGNOSIS — H538 Other visual disturbances: Secondary | ICD-10-CM | POA: Insufficient documentation

## 2013-09-21 DIAGNOSIS — Z7189 Other specified counseling: Secondary | ICD-10-CM | POA: Insufficient documentation

## 2013-09-28 LAB — HIV-1 GENOTYPR PLUS

## 2013-10-23 ENCOUNTER — Ambulatory Visit: Payer: 59

## 2013-10-26 ENCOUNTER — Ambulatory Visit: Payer: 59 | Admitting: Dietician

## 2013-12-05 ENCOUNTER — Ambulatory Visit: Payer: 59 | Admitting: *Deleted

## 2013-12-15 ENCOUNTER — Encounter: Payer: Self-pay | Admitting: Dietician

## 2013-12-15 ENCOUNTER — Encounter: Payer: 59 | Attending: Internal Medicine | Admitting: Dietician

## 2013-12-15 VITALS — Wt 227.0 lb

## 2013-12-15 DIAGNOSIS — Z794 Long term (current) use of insulin: Secondary | ICD-10-CM | POA: Diagnosis not present

## 2013-12-15 DIAGNOSIS — Z7189 Other specified counseling: Secondary | ICD-10-CM

## 2013-12-15 DIAGNOSIS — Z713 Dietary counseling and surveillance: Secondary | ICD-10-CM | POA: Diagnosis not present

## 2013-12-15 DIAGNOSIS — E119 Type 2 diabetes mellitus without complications: Secondary | ICD-10-CM | POA: Insufficient documentation

## 2013-12-15 NOTE — Progress Notes (Signed)
Diabetes Self-Management Education  Appt. Start Time: 915 Appt. End Time: 3254  12/15/2013  Ms. Katrina Parker, identified by name and date of birth, is a 43 y.o. female with a diagnosis of Diabetes: Type 2.  Other people present during visit:  Patient Primary concerns today: The patient is here to discuss her diabetes. She states that she would like to know what foods to eat. She is HIV positive. English is her second language and she does not read well. She checks her blood sugars 1x a day but has not been checking them recently due to lack of resources. Her fasting blood glucose this morning is 199 mg/dL. She is unaware which foods contain carbohydrates and reports that she may eat several carbohydrate foods in one meal.  ASSESSMENT  Weight 227 lb (102.967 kg), last menstrual period 06/01/2011. Body mass index is 41.51 kg/(m^2).  Initial Visit Information:  Are you currently following a meal plan?: No   Are you taking your medications as prescribed?: Yes Are you checking your feet?: No   How often do you need to have someone help you when you read instructions, pamphlets, or other written materials from your doctor or pharmacy?: 3 - Sometimes  Psychosocial:  Patient Belief/Attitude about Diabetes: Other (comment) Self-care barriers: Low literacy, English as a second language Self-management support: Doctor's office Other persons present: Patient Patient Concerns: Nutrition/Meal planning, Medication Special Needs: Verbal instruction Preferred Learning Style: Auditory Learning Readiness: Ready  Complications:   Last HgB A1C per outside source: 13.4 mg/dL How often do you check your blood sugar?: 1 times/day   Preferred Learning Style:   Auditory  Hands on  Learning Readiness:   Ready   MEDICATIONS: see list (metformin and lantus)   DIETARY INTAKE:  Avoided foods include beef, pork.    Fufu, brown rice, wheat bread, apple, chicken, fish, goat meat Eats 3x a  day  Beverages: green tea without sugar, ?costa  Usual physical activity: none  Estimated energy needs: 1800 calories 200 g carbohydrates 135 g protein 50 g fat  Progress Towards Goal(s):  In progress.   Nutritional Diagnosis:  [place]-2.2 Altered nutrition-related laboratory As related to type 2 diabetes, excessive carbohydrate intake, history of medication noncompliance.  As evidenced by HgbA1c 13.4%.    Intervention:  Nutrition education provided. Used food models to identify carbohydrate and protein foods. Explained effects of carbohydrate foods on blood sugars and action of insulin in the body. Demonstrated appropriate portion sizes of carbohydrate foods. Encouraged patient to include a protein food with each meal. Encouraged patient to begin walking.    Teaching Method Utilized:  Visual Auditory Hands on  Handouts given during visit include:  MyPlate  Barriers to learning/adherence to lifestyle change: illiteracy  Demonstrated degree of understanding via:  Teach Back   Monitoring/Evaluation:  Dietary intake, exercise, labs, and body weight in 4 week(s).

## 2014-01-16 ENCOUNTER — Ambulatory Visit: Payer: 59 | Admitting: Dietician

## 2014-01-17 ENCOUNTER — Encounter: Payer: Self-pay | Admitting: Infectious Diseases

## 2014-01-17 ENCOUNTER — Ambulatory Visit (INDEPENDENT_AMBULATORY_CARE_PROVIDER_SITE_OTHER): Payer: 59 | Admitting: Infectious Diseases

## 2014-01-17 ENCOUNTER — Encounter: Payer: 59 | Attending: Internal Medicine | Admitting: Dietician

## 2014-01-17 VITALS — BP 154/103 | HR 67 | Temp 98.1°F | Ht 60.0 in | Wt 228.0 lb

## 2014-01-17 VITALS — Wt 230.3 lb

## 2014-01-17 DIAGNOSIS — Z7189 Other specified counseling: Secondary | ICD-10-CM

## 2014-01-17 DIAGNOSIS — B20 Human immunodeficiency virus [HIV] disease: Secondary | ICD-10-CM

## 2014-01-17 DIAGNOSIS — Z713 Dietary counseling and surveillance: Secondary | ICD-10-CM | POA: Diagnosis not present

## 2014-01-17 DIAGNOSIS — E119 Type 2 diabetes mellitus without complications: Secondary | ICD-10-CM | POA: Diagnosis not present

## 2014-01-17 DIAGNOSIS — IMO0002 Reserved for concepts with insufficient information to code with codable children: Secondary | ICD-10-CM

## 2014-01-17 DIAGNOSIS — I10 Essential (primary) hypertension: Secondary | ICD-10-CM

## 2014-01-17 DIAGNOSIS — Z794 Long term (current) use of insulin: Secondary | ICD-10-CM | POA: Diagnosis not present

## 2014-01-17 DIAGNOSIS — E1165 Type 2 diabetes mellitus with hyperglycemia: Secondary | ICD-10-CM

## 2014-01-17 MED ORDER — ABACAVIR-DOLUTEGRAVIR-LAMIVUD 600-50-300 MG PO TABS: 1.0000 | ORAL_TABLET | Freq: Every day | ORAL | Status: DC

## 2014-01-17 NOTE — Progress Notes (Signed)
Patient ID: Katrina Parker, female   DOB: [birth year], 44 y.o.   MRN: [id number] HPI: Katrina Parker is a 44 y.o. female who is here for her HIV f/u.   Allergies: Allergies  Allergen Reactions  . Ibuprofen Rash    Vitals: Temp: 98.1 F (36.7 C) (01/06 0840) Temp Source: Oral (01/06 0840) BP: 154/103 mmHg (01/06 0840) Pulse Rate: 67 (01/06 0840)  Past Medical History: Past Medical History  Diagnosis Date  . HIV (human immunodeficiency virus infection)   . Diabetes mellitus without complication     Social History: History   Social History  . Marital Status: Widowed    Spouse Name: N/A    Number of Children: N/A  . Years of Education: N/A   Social History Main Topics  . Smoking status: Never Smoker   . Smokeless tobacco: Never Used  . Alcohol Use: No  . Drug Use: No  . Sexual Activity:    Partners: Male     Comment: pt. given condoms   Other Topics Concern  . None   Social History Narrative    Previous Regimen:   Current Regimen: Atripla  Labs: HIV 1 RNA QUANT (copies/mL)  Date Value  09/12/2013 254*  08/30/2013 197*  08/14/2013 27529*   CD4 T CELL ABS (/uL)  Date Value  08/14/2013 380*  04/10/2013 500  01/16/2013 440   HEP B S AB (no units)  Date Value  09/08/2006 POS*   HEPATITIS B SURFACE AG (no units)  Date Value  09/08/2006 NEG   HCV AB (no units)  Date Value  09/08/2006 NEG    CrCl: Estimated Creatinine Clearance: 96.3 mL/min (by C-G formula based on Cr of 0.69).  Lipids:    Component Value Date/Time   CHOL 176 09/13/2013 1223   TRIG 112 09/13/2013 1223   HDL 56 09/13/2013 1223   CHOLHDL 3.1 09/13/2013 1223   VLDL 22 09/13/2013 1223   LDLCALC 98 09/13/2013 1223    Assessment: 44 yo with HIV who is here for f/u. She has been on Atripla for HIV. For the past 2 VL, it has shown low detectable levels. Genotype has not reveal mutations. Her diabetes has been an issue due to the copay with Lantus Pen. We are going to get her in [facility] so this will help her with the copay. The copay for HTN meds should not be an issue (confirm with Rx). We are going to change her ART to Triumeq in the mean time to see if we can get her VL undetectable. Counseled on the new meds and get her into [facility]. Her metformin dose should not be an issue with Triumeq  Recommendations: Dc Atripla Start Triumeq 1 PO qday  Wilfred Lacy, PharmD Clinical [facility] 01/17/2014, 9:50 AM

## 2014-01-17 NOTE — Progress Notes (Signed)
Diabetes Self-Management Education  Appt. Start Time: 755 Appt. End Time: 825  Follow up: Katrina Parker returns today for diabetes education follow up. She reports that she has made many dietary changes. She brings her pictures of foods (protein foods vs. carbohydrates) to the grocery store to guide her. She states that she has been less thirsty and is urinating less. Katrina Parker usually wakes up around 7-8am and goes back to sleep until around 10am. Has her first meal around 11 or 12. May have vegetables, egg, and wheat bread or rice. Drinks unsweetened green tea. Has started limiting fruit (apples and plantains). Has started pairing protein foods with each meal: usually fish, shrimp, or eggs. Has also started eating yogurt. Taking medications and insulin but not testing blood sugars. Has a meter but cannot afford lancets.   Initial Visit Information:  Are you currently following a meal plan?: No   Are you taking your medications as prescribed?: Yes Are you checking your feet?: No   How often do you need to have someone help you when you read instructions, pamphlets, or other written materials from your doctor or pharmacy?: 3 - Sometimes  Psychosocial:  Patient Belief/Attitude about Diabetes: Other (comment) Self-care barriers: Low literacy, English as a second language Self-management support: Doctor's office Other persons present: Patient Patient Concerns: Nutrition/Meal planning, Medication Special Needs: Verbal instruction Preferred Learning Style: Auditory Learning Readiness: Ready  Complications:   Last HgB A1C per outside source: 13.4 mg/dL How often do you check your blood sugar?: 1 times/day   Preferred Learning Style:   Auditory  Hands on  Learning Readiness:   Ready   MEDICATIONS: see list (metformin and lantus)   DIETARY INTAKE:  Avoided foods include beef, pork.    Fufu, brown rice, wheat bread, apple, chicken, fish, goat meat Eats 3x a day  Beverages: green tea  without sugar, ?costa  Usual physical activity: none  Estimated energy needs: 1800 calories 200 g carbohydrates 135 g protein 50 g fat  Progress Towards Goal(s):  In progress.   Nutritional Diagnosis:  [place]-2.2 Altered nutrition-related laboratory As related to type 2 diabetes, excessive carbohydrate intake, history of medication noncompliance.  As evidenced by HgbA1c 13.4%.    Intervention:  Nutrition education provided. Used food models to identify carbohydrate and protein foods. Explained effects of carbohydrate foods on blood sugars and action of insulin in the body. Demonstrated appropriate portion sizes of carbohydrate foods. Encouraged patient to include a protein food with each meal. Encouraged patient to begin walking.    Teaching Method Utilized:  Visual Auditory Hands on  Handouts given during visit include:  MyPlate  Barriers to learning/adherence to lifestyle change: illiteracy  Demonstrated degree of understanding via:  Teach Back   Monitoring/Evaluation:  Dietary intake, exercise, labs, and body weight in 2 months.

## 2014-01-17 NOTE — Assessment & Plan Note (Addendum)
She is not able to afford her test strips.  Appreciate pharm efforts to get her rx's.  Greatly appreciate her f/u with [facility].  Needs her annual ophtho exam.  Needs pap. Will get in with our nurse.  Her feet show no lesions, normal light touch grossly.

## 2014-01-17 NOTE — Progress Notes (Signed)
   Subjective:    Patient ID: Katrina Parker, female    DOB: [birth year], 44 y.o.   MRN: [id number]  HPI 44 yo F with hx of HTN, DM and HIV+.Has been on atripla.  Had recent increase in HIV RNA, genotype naive.  Had f/u labs Sept with detectable RNA, HLA (-), INI (-).  Has been off anti-htn rx, having trouble affording DM rx as well.  Taking aripla, denies missed.   HIV 1 RNA QUANT (copies/mL)  Date Value  09/12/2013 254*  08/30/2013 197*  08/14/2013 27529*   CD4 T CELL ABS (/uL)  Date Value  08/14/2013 380*  04/10/2013 500  01/16/2013 440    Review of Systems  Constitutional: Negative for appetite change.  Gastrointestinal: Negative for diarrhea and constipation.  Genitourinary: Negative for dysuria and difficulty urinating.       Objective:   Physical Exam  Constitutional: She appears well-developed and well-nourished.  HENT:  Mouth/Throat: No oropharyngeal exudate.  Eyes: EOM are normal. Pupils are equal, round, and reactive to light.  Neck: Neck supple.  Cardiovascular: Normal rate, regular rhythm and normal heart sounds.   Pulmonary/Chest: Effort normal and breath sounds normal.  Abdominal: Soft. Bowel sounds are normal. She exhibits no distension. There is no tenderness.  Lymphadenopathy:    She has no cervical adenopathy.          Assessment & Plan:

## 2014-01-17 NOTE — Assessment & Plan Note (Signed)
Will change her to triumeq to see if we can get her to undetectable.  She is offered/refuses condoms.  Will see her back in 6 weeks.

## 2014-01-17 NOTE — Assessment & Plan Note (Signed)
Will help her get back on her meds.  Greatly appreciate pharm help with her meds.

## 2014-01-17 NOTE — Patient Instructions (Addendum)
Low sodium

## 2014-01-25 ENCOUNTER — Ambulatory Visit (INDEPENDENT_AMBULATORY_CARE_PROVIDER_SITE_OTHER): Payer: 59 | Admitting: Family Medicine

## 2014-01-25 VITALS — BP 138/82 | HR 89 | Temp 98.3°F | Resp 16 | Ht 62.0 in | Wt 228.0 lb

## 2014-01-25 DIAGNOSIS — E669 Obesity, unspecified: Secondary | ICD-10-CM

## 2014-01-25 DIAGNOSIS — E1165 Type 2 diabetes mellitus with hyperglycemia: Secondary | ICD-10-CM

## 2014-01-25 DIAGNOSIS — E119 Type 2 diabetes mellitus without complications: Secondary | ICD-10-CM | POA: Diagnosis not present

## 2014-01-25 DIAGNOSIS — IMO0002 Reserved for concepts with insufficient information to code with codable children: Secondary | ICD-10-CM

## 2014-01-25 DIAGNOSIS — I10 Essential (primary) hypertension: Secondary | ICD-10-CM

## 2014-01-25 DIAGNOSIS — B20 Human immunodeficiency virus [HIV] disease: Secondary | ICD-10-CM

## 2014-01-25 LAB — COMPLETE METABOLIC PANEL WITH GFR
ALK PHOS: 90 U/L (ref 39–117)
ALT: 11 U/L (ref 0–35)
AST: 12 U/L (ref 0–37)
Albumin: 3.9 g/dL (ref 3.5–5.2)
BUN: 17 mg/dL (ref 6–23)
CO2: 27 mEq/L (ref 19–32)
Calcium: 9.1 mg/dL (ref 8.4–10.5)
Chloride: 105 mEq/L (ref 96–112)
Creat: 0.71 mg/dL (ref 0.50–1.10)
GFR, Est African American: >89 mL/min
GLUCOSE: 205 mg/dL — AB (ref 70–99)
Potassium: 4.1 mEq/L (ref 3.5–5.3)
SODIUM: 139 meq/L (ref 135–145)
TOTAL PROTEIN: 7.1 g/dL (ref 6.0–8.3)
Total Bilirubin: 0.3 mg/dL (ref 0.2–1.2)

## 2014-01-25 LAB — GLUCOSE, CAPILLARY: GLUCOSE-CAPILLARY: 226 mg/dL — AB (ref 70–99)

## 2014-01-25 LAB — HEMOGLOBIN A1C
Hgb A1c MFr Bld: 6.8 % — ABNORMAL HIGH (ref <5.7)
Mean Plasma Glucose: 148 mg/dL — ABNORMAL HIGH (ref <117)

## 2014-01-25 LAB — GLUCOSE, POCT (MANUAL RESULT ENTRY): POC Glucose: 226 mg/dl — AB (ref 70–99)

## 2014-01-25 MED ORDER — LISINOPRIL-HYDROCHLOROTHIAZIDE 10-12.5 MG PO TABS: 1.0000 | ORAL_TABLET | Freq: Every day | ORAL | Status: DC

## 2014-01-25 MED ORDER — METFORMIN HCL 500 MG PO TABS: 500.0000 mg | ORAL_TABLET | Freq: Two times a day (BID) | ORAL | Status: DC

## 2014-01-25 NOTE — Patient Instructions (Signed)
Type 2 Diabetes Mellitus Type 2 diabetes mellitus, often simply referred to as type 2 diabetes, is a long-lasting (chronic) disease. In type 2 diabetes, the pancreas does not make enough insulin (a hormone), the cells are less responsive to the insulin that is made (insulin resistance), or both. Normally, insulin moves sugars from food into the tissue cells. The tissue cells use the sugars for energy. The lack of insulin or the lack of normal response to insulin causes excess sugars to build up in the blood instead of going into the tissue cells. As a result, high blood sugar (hyperglycemia) develops. The effect of high sugar (glucose) levels can cause many complications. Type 2 diabetes was also previously called adult-onset diabetes, but it can occur at any age.  RISK FACTORS  A person is predisposed to developing type 2 diabetes if someone in the family has the disease and also has one or more of the following primary risk factors:  Overweight.  An inactive lifestyle.  A history of consistently eating high-calorie foods. Maintaining a normal weight and regular physical activity can reduce the chance of developing type 2 diabetes. SYMPTOMS  A person with type 2 diabetes may not show symptoms initially. The symptoms of type 2 diabetes appear slowly. The symptoms include:  Increased thirst (polydipsia).  Increased urination (polyuria).  Increased urination during the night (nocturia).  Weight loss. This weight loss may be rapid.  Frequent, recurring infections.  Tiredness (fatigue).  Weakness.  Vision changes, such as blurred vision.  Fruity smell to your breath.  Abdominal pain.  Nausea or vomiting.  Cuts or bruises which are slow to heal.  Tingling or numbness in the hands or feet. DIAGNOSIS Type 2 diabetes is frequently not diagnosed until complications of diabetes are present. Type 2 diabetes is diagnosed when symptoms or complications are present and when blood  glucose levels are increased. Your blood glucose level may be checked by one or more of the following blood tests:  A fasting blood glucose test. You will not be allowed to eat for at least 8 hours before a blood sample is taken.  A random blood glucose test. Your blood glucose is checked at any time of the day regardless of when you ate.  A hemoglobin A1c blood glucose test. A hemoglobin A1c test provides information about blood glucose control over the previous 3 months.  An oral glucose tolerance test (OGTT). Your blood glucose is measured after you have not eaten (fasted) for 2 hours and then after you drink a glucose-containing beverage. TREATMENT   You may need to take insulin or diabetes medicine daily to keep blood glucose levels in the desired range.  If you use insulin, you may need to adjust the dosage depending on the carbohydrates that you eat with each meal or snack. The treatment goal is to maintain the before meal blood sugar (preprandial glucose) level at 70-130 mg/dL. HOME CARE INSTRUCTIONS   Have your hemoglobin A1c level checked twice a year.  Perform daily blood glucose monitoring as directed by your health care provider.  Monitor urine ketones when you are ill and as directed by your health care provider.  Take your diabetes medicine or insulin as directed by your health care provider to maintain your blood glucose levels in the desired range.  Never run out of diabetes medicine or insulin. It is needed every day.  If you are using insulin, you may need to adjust the amount of insulin given based on your intake of   carbohydrates. Carbohydrates can raise blood glucose levels but need to be included in your diet. Carbohydrates provide vitamins, minerals, and fiber which are an essential part of a healthy diet. Carbohydrates are found in fruits, vegetables, whole grains, dairy products, legumes, and foods containing added sugars.  Eat healthy foods. You should make an  appointment to see a registered dietitian to help you create an eating plan that is right for you.  Lose weight if you are overweight.  Carry a medical alert card or wear your medical alert jewelry.  Carry a 15-gram carbohydrate snack with you at all times to treat low blood glucose (hypoglycemia). Some examples of 15-gram carbohydrate snacks include:  Glucose tablets, 3 or 4.  Glucose gel, 15-gram tube.  Raisins, 2 tablespoons (24 grams).  Jelly beans, 6.  Animal crackers, 8.  Regular pop, 4 ounces (120 mL).  Gummy treats, 9.  Recognize hypoglycemia. Hypoglycemia occurs with blood glucose levels of 70 mg/dL and below. The risk for hypoglycemia increases when fasting or skipping meals, during or after intense exercise, and during sleep. Hypoglycemia symptoms can include:  Tremors or shakes.  Decreased ability to concentrate.  Sweating.  Increased heart rate.  Headache.  Dry mouth.  Hunger.  Irritability.  Anxiety.  Restless sleep.  Altered speech or coordination.  Confusion.  Treat hypoglycemia promptly. If you are alert and able to safely swallow, follow the 15:15 rule:  Take 15-20 grams of rapid-acting glucose or carbohydrate. Rapid-acting options include glucose gel, glucose tablets, or 4 ounces (120 mL) of fruit juice, regular soda, or low-fat milk.  Check your blood glucose level 15 minutes after taking the glucose.  Take 15-20 grams more of glucose if the repeat blood glucose level is still 70 mg/dL or below.  Eat a meal or snack within 1 hour once blood glucose levels return to normal.  Be alert to feeling very thirsty and urinating more frequently than usual, which are early signs of hyperglycemia. An early awareness of hyperglycemia allows for prompt treatment. Treat hyperglycemia as directed by your health care provider.  Engage in at least 150 minutes of moderate-intensity physical activity a week, spread over at least 3 days of the week or as  directed by your health care provider. In addition, you should engage in resistance exercise at least 2 times a week or as directed by your health care provider. Try to spend no more than 90 minutes at one time inactive.  Adjust your medicine and food intake as needed if you start a new exercise or sport.  Follow your sick-day plan anytime you are unable to eat or drink as usual.  Do not use any tobacco products including cigarettes, chewing tobacco, or electronic cigarettes. If you need help quitting, ask your health care provider.  Limit alcohol intake to no more than 1 drink per day for nonpregnant women and 2 drinks per day for men. You should drink alcohol only when you are also eating food. Talk with your health care provider whether alcohol is safe for you. Tell your health care provider if you drink alcohol several times a week.  Keep all follow-up visits as directed by your health care provider. This is important.  Schedule an eye exam soon after the diagnosis of type 2 diabetes and then annually.  Perform daily skin and foot care. Examine your skin and feet daily for cuts, bruises, redness, nail problems, bleeding, blisters, or sores. A foot exam by a health care provider should be done annually.    Brush your teeth and gums at least twice a day and floss at least once a day. Follow up with your dentist regularly.  Share your diabetes management plan with your workplace or school.  Stay up-to-date with immunizations. It is recommended that people with diabetes who are over 86 years old get the pneumonia vaccine. In some cases, two separate shots may be given. Ask your health care provider if your pneumonia vaccination is up-to-date.  Learn to manage stress.  Obtain ongoing diabetes education and support as needed.  Participate in or seek rehabilitation as needed to maintain or improve independence and quality of life. Request a physical or occupational therapy referral if you are  having foot or hand numbness, or difficulties with grooming, dressing, eating, or physical activity. SEEK MEDICAL CARE IF:   You are unable to eat food or drink fluids for more than 6 hours.  You have nausea and vomiting for more than 6 hours.  Your blood glucose level is over 240 mg/dL.  There is a change in mental status.  You develop an additional serious illness.  You have diarrhea for more than 6 hours.  You have been sick or have had a fever for a couple of days and are not getting better.  You have pain during any physical activity.  SEEK IMMEDIATE MEDICAL CARE IF:  You have difficulty breathing.  You have moderate to large ketone levels. MAKE SURE YOU:  Understand these instructions.  Will watch your condition.  Will get help right away if you are not doing well or get worse. Document Released: 12/29/2004 Document Revised: 05/15/2013 Document Reviewed: 07/28/2011 [facility] Patient Information 2015 [place], [place]. This information is not intended to replace advice given to you by your health care provider. Make sure you discuss any questions you have with your health care provider. DASH Eating Plan DASH stands for "Dietary Approaches to Stop Hypertension." The DASH eating plan is a healthy eating plan that has been shown to reduce high blood pressure (hypertension). Additional health benefits may include reducing the risk of type 2 diabetes mellitus, heart disease, and stroke. The DASH eating plan may also help with weight loss. WHAT DO I NEED TO KNOW ABOUT THE DASH EATING PLAN? For the DASH eating plan, you will follow these general guidelines:  Choose foods with a percent daily value for sodium of less than 5% (as listed on the food label).  Use salt-free seasonings or herbs instead of table salt or sea salt.  Check with your health care provider or pharmacist before using salt substitutes.  Eat lower-sodium products, often labeled as "lower sodium" or "no salt  added."  Eat fresh foods.  Eat more vegetables, fruits, and low-fat dairy products.  Choose whole grains. Look for the word "whole" as the first word in the ingredient list.  Choose fish and skinless chicken or [place] more often than red meat. Limit fish, poultry, and meat to 6 oz (170 g) each day.  Limit sweets, desserts, sugars, and sugary drinks.  Choose heart-healthy fats.  Limit cheese to 1 oz (28 g) per day.  Eat more home-cooked food and less restaurant, buffet, and fast food.  Limit fried foods.  Cook foods using methods other than frying.  Limit canned vegetables. If you do use them, rinse them well to decrease the sodium.  When eating at a restaurant, ask that your food be prepared with less salt, or no salt if possible. WHAT FOODS CAN I EAT? Seek help from a dietitian  for individual calorie needs. Grains Whole grain or whole wheat bread. Brown rice. Whole grain or whole wheat pasta. Quinoa, bulgur, and whole grain cereals. Low-sodium cereals. Corn or whole wheat flour tortillas. Whole grain cornbread. Whole grain crackers. Low-sodium crackers. Vegetables Fresh or frozen vegetables (raw, steamed, roasted, or grilled). Low-sodium or reduced-sodium tomato and vegetable juices. Low-sodium or reduced-sodium tomato sauce and paste. Low-sodium or reduced-sodium canned vegetables.  Fruits All fresh, canned (in natural juice), or frozen fruits. Meat and Other Protein Products Ground beef (85% or leaner), grass-fed beef, or beef trimmed of fat. Skinless chicken or [place]. Ground chicken or [place]. Pork trimmed of fat. All fish and seafood. Eggs. Dried beans, peas, or lentils. Unsalted nuts and seeds. Unsalted canned beans. Dairy Low-fat dairy products, such as skim or 1% milk, 2% or reduced-fat cheeses, low-fat ricotta or cottage cheese, or plain low-fat yogurt. Low-sodium or reduced-sodium cheeses. Fats and Oils Tub margarines without trans fats. Light or reduced-fat  mayonnaise and salad dressings (reduced sodium). Avocado. Safflower, olive, or canola oils. Natural peanut or almond butter. Other Unsalted popcorn and pretzels. The items listed above may not be a complete list of recommended foods or beverages. Contact your dietitian for more options. WHAT FOODS ARE NOT RECOMMENDED? Grains White bread. White pasta. White rice. Refined cornbread. Bagels and croissants. Crackers that contain trans fat. Vegetables Creamed or fried vegetables. Vegetables in a cheese sauce. Regular canned vegetables. Regular canned tomato sauce and paste. Regular tomato and vegetable juices. Fruits Dried fruits. Canned fruit in light or heavy syrup. Fruit juice. Meat and Other Protein Products Fatty cuts of meat. Ribs, chicken wings, bacon, sausage, bologna, salami, chitterlings, fatback, hot dogs, bratwurst, and packaged luncheon meats. Salted nuts and seeds. Canned beans with salt. Dairy Whole or 2% milk, cream, half-and-half, and cream cheese. Whole-fat or sweetened yogurt. Full-fat cheeses or blue cheese. Nondairy creamers and whipped toppings. Processed cheese, cheese spreads, or cheese curds. Condiments Onion and garlic salt, seasoned salt, table salt, and sea salt. Canned and packaged gravies. Worcestershire sauce. Tartar sauce. Barbecue sauce. Teriyaki sauce. Soy sauce, including reduced sodium. Steak sauce. Fish sauce. Oyster sauce. Cocktail sauce. Horseradish. Ketchup and mustard. Meat flavorings and tenderizers. Bouillon cubes. Hot sauce. Tabasco sauce. Marinades. Taco seasonings. Relishes. Fats and Oils Butter, stick margarine, lard, shortening, ghee, and bacon fat. Coconut, palm kernel, or palm oils. Regular salad dressings. Other Pickles and olives. Salted popcorn and pretzels. The items listed above may not be a complete list of foods and beverages to avoid. Contact your dietitian for more information. WHERE CAN I FIND MORE INFORMATION? National Heart, Lung, and  Blood Institute: travelstabloid.com Document Released: 12/18/2010 Document Revised: 05/15/2013 Document Reviewed: 11/02/2012 [facility] Patient Information 2015 [place], [place]. This information is not intended to replace advice given to you by your health care provider. Make sure you discuss any questions you have with your health care provider.

## 2014-01-25 NOTE — Progress Notes (Signed)
Subjective:    Patient ID: Katrina Parker, female    DOB: 1970-03-21, 44 y.o.   MRN: [id number]  HPI Katrina Parker is a pleasant 44 year old female that presents with a history of HIV, DMII, and hypertension. She was last evaluated in clinic on 09/13/2013. She states that she has not been taking medications consistently. However, she has been following a carbohydrate diet as outlined by [facility].    Patient here for follow-up of hypertension.  She is not exercising and is adherent to low salt diet.  She reports that she has not been checking blood pressure at home. Patient denies chest pain, dyspnea, fatigue, near-syncope, orthopnea, palpitations and tachypnea.  Cardiovascular risk factors include: diabetes mellitus, hypertension and obesity (BMI >= 30 kg/m2).   Patient presents for follow up of diabetes. She states that she has not been checking blood sugars at home due to cost constraints. She cannot afford diabetic supplies. Symptoms have been basically asymptomatic. Patient denies foot ulcerations, hypoglycemia , increase appetite, nausea, polydipsia and polyuria.  Evaluation to date has been included: hemoglobin A1C. She states that she has been taking Lantus 10 units at night, but has not been taking Metformin consistently.   Past Medical History  Diagnosis Date  . HIV (human immunodeficiency virus infection)   . Diabetes mellitus without complication    Review of Systems  HENT: Negative.   Respiratory: Negative.   Cardiovascular: Positive for leg swelling (occasional ankle edema after work).  Gastrointestinal: Negative.   Endocrine: Negative.  Negative for polydipsia, polyphagia and polyuria.  Genitourinary: Negative.   Musculoskeletal: Negative.   Skin: Negative.   Allergic/Immunologic: Negative.   Neurological: Negative.   Hematological: Negative.   Psychiatric/Behavioral: Negative.        Objective:   Physical Exam  Constitutional: She is oriented to  person, place, and time. She appears well-developed and well-nourished.  HENT:  Head: Normocephalic and atraumatic.  Right Ear: External ear normal.  Left Ear: External ear normal.  Mouth/Throat: Oropharynx is clear and moist.  Eyes: Conjunctivae and EOM are normal. Pupils are equal, round, and reactive to light.  Neck: Normal range of motion. Neck supple.  Cardiovascular: Normal rate, regular rhythm, normal heart sounds and intact distal pulses.   Pulmonary/Chest: Effort normal and breath sounds normal.  Abdominal: Soft. Bowel sounds are normal.  Musculoskeletal: Normal range of motion.  Neurological: She is alert and oriented to person, place, and time. She has normal reflexes. No cranial nerve deficit. Coordination normal.  Monofilament exam complete  Skin: Skin is warm and dry.  Psychiatric: She has a normal mood and affect. Her behavior is normal. Judgment and thought content normal.         BP 138/82 mmHg  Pulse 89  Temp(Src) 98.3 F (36.8 C) (Oral)  Resp 16  Ht 5\' 2"  (1.575 m)  Wt 228 lb (103.42 kg)  BMI 41.69 kg/m2  LMP 06/01/2011 Assessment & Plan:  1. Essential hypertension, benign Katrina Parker has not been compliant with Norvasc 10 mg. I notified pharmacy who state that she has not picked up an Rx since November. I will discontinue Norvasc. I will check renal function today.   - lisinopril-hydrochlorothiazide (PRINZIDE,ZESTORETIC) 10-12.5 MG per tablet; Take 1 tablet by mouth daily.  Dispense: 30 tablet; Refill: 2 - COMPLETE METABOLIC PANEL WITH GFR - Urinalysis, Complete  2. Diabetes type 2, uncontrolled Katrina Parker picked up prescription of Lantus 1 week ago according to pharmacy. She states that  she has been using Lantus inconsistently at night and that she cannot afford diabetic supplies. She states that she has an appointment with a medication assistance program. I will re-start Metformin, she states that she was able to tolerate Metformin. Last hemoglobin A1c was  13.4, I will re-check today.  - metFORMIN (GLUCOPHAGE) 500 MG tablet; Take 1 tablet (500 mg total) by mouth 2 (two) times daily with a meal.  Dispense: 60 tablet; Refill: 3 - Glucose (CBG) - Hemoglobin A1c  3. Obesity Patient is currently followed by by diabetes and nutrition services. She is scheduled to follow up in 2 months. Encouraged to start a walking regimen 3 times per week for 30 minutes.    4. Human immunodeficiency virus (HIV) disease Reports that she is taking Atripla consistently, reviewed previous labs. She was evaluated by Dr. Johnnye Sima, [facility], on 01/17/2014. She is to follow up as scheduled.    RTC: 1 week for blood pressure check Follow up in 3 months with Dr. Zigmund Daniel for DMII and HTN

## 2014-01-26 ENCOUNTER — Telehealth: Payer: Self-pay | Admitting: Family Medicine

## 2014-01-26 ENCOUNTER — Encounter: Payer: Self-pay | Admitting: Family Medicine

## 2014-01-26 LAB — URINALYSIS, COMPLETE
BACTERIA UA: NONE SEEN
Bilirubin Urine: NEGATIVE
CRYSTALS: NONE SEEN
Casts: NONE SEEN
Glucose, UA: 100 mg/dL — AB
HGB URINE DIPSTICK: NEGATIVE
KETONES UR: NEGATIVE mg/dL
LEUKOCYTES UA: NEGATIVE
Nitrite: NEGATIVE
PROTEIN: NEGATIVE mg/dL
SPECIFIC GRAVITY, URINE: 1.021 (ref 1.005–1.030)
Squamous Epithelial / LPF: NONE SEEN
Urobilinogen, UA: 0.2 mg/dL (ref 0.0–1.0)
pH: 5.5 (ref 5.0–8.0)

## 2014-01-26 NOTE — Telephone Encounter (Signed)
Reviewed laboratory results, hemoglobin  A1C decreased from 13.4 to 6.8. Patient to continue Metformin 500 mg BID and start Lisinopril-Hydrochlorothiazide 10-12.5. Katrina Parker is also to follow up with diabetes education as scheduled. She expressed understanding of medication regimen and will follow-up in 1 week for a blood pressure check.    Katrina Dew, FNP

## 2014-02-01 ENCOUNTER — Ambulatory Visit: Payer: 59

## 2014-02-01 VITALS — BP 101/66 | HR 91

## 2014-02-08 ENCOUNTER — Ambulatory Visit: Payer: 59

## 2014-02-08 VITALS — BP 120/78

## 2014-02-08 DIAGNOSIS — I10 Essential (primary) hypertension: Secondary | ICD-10-CM

## 2014-03-21 ENCOUNTER — Ambulatory Visit: Payer: 59 | Admitting: Dietician

## 2014-03-22 ENCOUNTER — Other Ambulatory Visit: Payer: Self-pay | Admitting: *Deleted

## 2014-03-22 DIAGNOSIS — Z113 Encounter for screening for infections with a predominantly sexual mode of transmission: Secondary | ICD-10-CM

## 2014-04-11 ENCOUNTER — Ambulatory Visit: Payer: 59 | Admitting: Dietician

## 2014-04-26 ENCOUNTER — Ambulatory Visit (INDEPENDENT_AMBULATORY_CARE_PROVIDER_SITE_OTHER): Payer: 59 | Admitting: Internal Medicine

## 2014-04-26 VITALS — BP 144/93 | HR 83 | Temp 97.6°F | Resp 16 | Ht 62.0 in | Wt 228.0 lb

## 2014-04-26 DIAGNOSIS — H538 Other visual disturbances: Secondary | ICD-10-CM | POA: Diagnosis not present

## 2014-04-26 DIAGNOSIS — E1165 Type 2 diabetes mellitus with hyperglycemia: Secondary | ICD-10-CM | POA: Diagnosis not present

## 2014-04-26 DIAGNOSIS — E785 Hyperlipidemia, unspecified: Secondary | ICD-10-CM

## 2014-04-26 DIAGNOSIS — I10 Essential (primary) hypertension: Secondary | ICD-10-CM

## 2014-04-26 DIAGNOSIS — IMO0002 Reserved for concepts with insufficient information to code with codable children: Secondary | ICD-10-CM

## 2014-04-26 LAB — HEMOGLOBIN A1C
HEMOGLOBIN A1C: 9.1 % — AB (ref <5.7)
MEAN PLASMA GLUCOSE: 214 mg/dL — AB (ref <117)

## 2014-04-26 LAB — BASIC METABOLIC PANEL
BUN: 17 mg/dL (ref 6–23)
CALCIUM: 9.3 mg/dL (ref 8.4–10.5)
CO2: 27 mEq/L (ref 19–32)
CREATININE: 0.63 mg/dL (ref 0.50–1.10)
Chloride: 102 mEq/L (ref 96–112)
Glucose, Bld: 244 mg/dL — ABNORMAL HIGH (ref 70–99)
Potassium: 4 mEq/L (ref 3.5–5.3)
Sodium: 138 mEq/L (ref 135–145)

## 2014-04-26 MED ORDER — AMLODIPINE BESYLATE 2.5 MG PO TABS: 2.5000 mg | ORAL_TABLET | Freq: Every day | ORAL | Status: DC

## 2014-04-26 MED ORDER — AMLODIPINE BESYLATE 5 MG PO TABS: 5.0000 mg | ORAL_TABLET | Freq: Every day | ORAL | Status: DC

## 2014-04-26 MED ORDER — ATORVASTATIN CALCIUM 20 MG PO TABS: 20.0000 mg | ORAL_TABLET | Freq: Every day | ORAL | Status: DC

## 2014-04-26 NOTE — Progress Notes (Signed)
Patient ID: Katrina Parker, female   DOB: 1970/07/09, 44 y.o.   MRN: [id number]  HPI 44 y.o. female  presents for 3 month follow up with hypertension, hyperlipidemia, diabetes. Her blood pressure has not been controlled at home, today their BP is BP: (!) 144/93 mmHg She does not workout. She denies chest pain, shortness of breath, dizziness.  She is not on cholesterol medication and denies myalgias. Her cholesterol is not at goal. The cholesterol was:  09/13/2013: Cholesterol, Total 176; HDL-C 56; LDL (calc) 98; Triglycerides 112 She has been working on diet and exercise for Diabetes without complications, she is not on bASA, she is on ACE/ARB, and denies foot ulcerations, hyperglycemia, hypoglycemia , increased appetite, nausea, polydipsia, polyuria and vomiting. She does c/o blurred vision. Last A1C was: 01/25/2014: Hemoglobin-A1c 6.8* Patient is on Vitamin D supplement. No results found for requested labs within last 365 days.  Current Medications:  Current Outpatient Prescriptions on File Prior to Visit  Medication Sig Dispense Refill  . Abacavir-Dolutegravir-Lamivud 600-50-300 MG TABS Take 1 tablet by mouth daily. 90 tablet 3  . glucose monitoring kit (FREESTYLE) monitoring kit 1 each by Does not apply route 2 (two) times daily at 8 am and 10 pm. 1 each 0  . metFORMIN (GLUCOPHAGE) 500 MG tablet Take 1 tablet (500 mg total) by mouth 2 (two) times daily with a meal. 60 tablet 3  . fluconazole (DIFLUCAN) 100 MG tablet Take 1 tablet (100 mg total) by mouth daily. (Patient not taking: Reported on 01/17/2014) 10 tablet 0  . lisinopril-hydrochlorothiazide (PRINZIDE,ZESTORETIC) 10-12.5 MG per tablet Take 1 tablet by mouth daily. (Patient not taking: Reported on 04/26/2014) 30 tablet 2  . ONE TOUCH ULTRA TEST test strip   3   No current facility-administered medications on file prior to visit.   Medical History:  Past Medical History  Diagnosis Date  . HIV (human immunodeficiency virus infection)   .  Diabetes mellitus without complication    Allergies:  Allergies  Allergen Reactions  . Ibuprofen Rash     Review of Systems:  Review of Systems  Constitutional: Negative.   HENT: Negative.   Eyes: Positive for blurred vision.  Respiratory: Negative.   Cardiovascular: Negative.   Gastrointestinal: Negative.   Genitourinary: Negative.   Musculoskeletal: Negative.   Skin: Negative.   Neurological: Negative.   Endo/Heme/Allergies: Negative.   Psychiatric/Behavioral: Negative.     Family history- Review and unchanged Social history- Review and unchanged Physical Exam: BP 144/93 mmHg  Pulse 83  Temp(Src) 97.6 F (36.4 C) (Oral)  Resp 16  Ht _0  (1.575 m)  Wt 228 lb (103.42 kg)  BMI 41.69 kg/m2  LMP 06/01/2011 Wt Readings from Last 3 Encounters:  04/26/14 228 lb (103.42 kg)  01/25/14 228 lb (103.42 kg)  01/17/14 228 lb (103.42 kg)   General Appearance: Well nourished, in no apparent distress. Eyes: PERRLA, EOMs, conjunctiva no swelling or erythema Sinuses: No Frontal/maxillary tenderness ENT/Mouth: Ext aud canals clear, TMs without erythema, bulging. No erythema, swelling, or exudate on post pharynx.  Tonsils not swollen or erythematous. Hearing normal.  Neck: Supple, thyroid normal. No JVD or Carotid Bruit Respiratory: Respiratory effort normal, BS equal bilaterally without rales, rhonchi, wheezing or stridor.  Cardio: RRR with no MRGs. Brisk peripheral pulses without edema.  Abdomen: Soft, + BS.  Non tender, no guarding, rebound, hernias, masses. Lymphatics: Non tender without lymphadenopathy.  Musculoskeletal: Full ROM, normal strength, normal gait.  Skin: Warm, dry without rashes, lesions, ecchymosis.  Neuro:  Cranial nerves intact. No cerebellar symptoms. Sensation intact.  Psych: Awake and oriented X 3, normal affect, Insight and Judgment appropriate. Good recent and remote recall       Assessment and Plan:  1. Diabetes type 2, uncontrolled - Pt has no  record of blood sugars. Will ask her to check BS fasting and 2 hours postprandial (Breakfast, lunch, dinner each for 1 week) - Hemoglobin A1C - Ambulatory referral to Ophthalmology  - Microalbumin, urine  2. Essential hypertension, benign -  Pt currently on Lisinopril-HCTZ 10-12.5. Will add Norvasc 2.5. recheck BP in 4 weeks and consider increasing Norvasc is still need adjustment. - Basic Metabolic Panel - Microalbumin, urine- Continue medication, monitor blood pressure at home.  - Continue DASH diet.  Reminder to go to the ER if any CP, SOB, nausea, dizziness, severe HA, changes vision/speech, left arm numbness and tingling and jaw pain. - amLODipine (NORVASC) 2.5 MG tablet; Take 1 tablet (2.5 mg total) by mouth daily.  Dispense: 30 tablet; Refill: 3  3. Hyperlipidemia LDL goal <100 - Based on 10- year risk a moderate sttin is recommended. Will start Atorvastatin/ - atorvastatin (LIPITOR) 20 MG tablet; Take 1 tablet (20 mg total) by mouth daily.  Dispense: 90 tablet; Refill: 3  4. Blurred Vision - Ambulatory referral to Ophthalmology    Continue diet and meds as discussed. Further disposition pending results of labs. Discussed medication effects and side effects.    Jacia Sickman A., MD 9:51 AM [facility]

## 2014-04-26 NOTE — Patient Instructions (Signed)
Cholesterol Cholesterol is a white, waxy, fat-like substance needed by your body in small amounts. The liver makes all the cholesterol you need. Cholesterol is carried from the liver by the blood through the blood vessels. Deposits of cholesterol (plaque) may build up on blood vessel walls. These make the arteries narrower and stiffer. Cholesterol plaques increase the risk for heart attack and stroke.  You cannot feel your cholesterol level even if it is very high. The only way to know it is high is with a blood test. Once you know your cholesterol levels, you should keep a record of the test results. Work with your health care provider to keep your levels in the desired range.  WHAT DO THE RESULTS MEAN?  Total cholesterol is a rough measure of all the cholesterol in your blood.   LDL is the so-called bad cholesterol. This is the type that deposits cholesterol in the walls of the arteries. You want this level to be low.   HDL is the good cholesterol because it cleans the arteries and carries the LDL away. You want this level to be high.  Triglycerides are fat that the body can either burn for energy or store. High levels are closely linked to heart disease.  WHAT ARE THE DESIRED LEVELS OF CHOLESTEROL?  Total cholesterol below 200.   LDL below 100 for people at risk, below 70 for those at very high risk.   HDL above 50 is good, above 60 is best.   Triglycerides below 150.  HOW CAN I LOWER MY CHOLESTEROL?  Diet. Follow your diet programs as directed by your health care provider.   Choose fish or white meat chicken and [place], roasted or baked. Limit fatty cuts of red meat, fried foods, and processed meats, such as sausage and lunch meats.   Eat lots of fresh fruits and vegetables.  Choose whole grains, beans, pasta, potatoes, and cereals.   Use only small amounts of olive, corn, or canola oils.   Avoid butter, mayonnaise, shortening, or palm kernel oils.  Avoid foods with  trans fats.   Drink skim or nonfat milk and eat low-fat or nonfat yogurt and cheeses. Avoid whole milk, cream, ice cream, egg yolks, and full-fat cheeses.   Healthy desserts include angel food cake, ginger snaps, animal crackers, hard candy, popsicles, and low-fat or nonfat frozen yogurt. Avoid pastries, cakes, pies, and cookies.   Exercise. Follow your exercise programs as directed by your health care provider.   A regular program helps decrease LDL and raise HDL.   A regular program helps with weight control.   Do things that increase your activity level like gardening, walking, or taking the stairs. Ask your health care provider about how you can be more active in your daily life.   Medicine. Take medicine only as directed by your health care provider.   Medicine may be prescribed by your health care provider to help lower cholesterol and decrease the risk for heart disease.   If you have several risk factors, you may need medicine even if your levels are normal. Document Released: 09/23/2000 Document Revised: 05/15/2013 Document Reviewed: 10/12/2012 [facility] Patient Information 2015 [place], [place]. This information is not intended to replace advice given to you by your health care provider. Make sure you discuss any questions you have with your health care provider. Food Choices to Lower Your Triglycerides  Triglycerides are a type of fat in your blood. High levels of triglycerides can increase the risk of heart disease and  stroke. If your triglyceride levels are high, the foods you eat and your eating habits are very important. Choosing the right foods can help lower your triglycerides.  WHAT GENERAL GUIDELINES DO I NEED TO FOLLOW?  Lose weight if you are overweight.   Limit or avoid alcohol.   Fill one half of your plate with vegetables and green salads.   Limit fruit to two servings a day. Choose fruit instead of juice.   Make one fourth of your plate whole  grains. Look for the word "whole" as the first word in the ingredient list.  Fill one fourth of your plate with lean protein foods.  Enjoy fatty fish (such as salmon, mackerel, sardines, and tuna) three times a week.   Choose healthy fats.   Limit foods high in starch and sugar.  Eat more home-cooked food and less restaurant, buffet, and fast food.  Limit fried foods.  Cook foods using methods other than frying.  Limit saturated fats.  Check ingredient lists to avoid foods with partially hydrogenated oils (trans fats) in them. WHAT FOODS CAN I EAT?  Grains Whole grains, such as whole wheat or whole grain breads, crackers, cereals, and pasta. Unsweetened oatmeal, bulgur, barley, quinoa, or brown rice. Corn or whole wheat flour tortillas.  Vegetables Fresh or frozen vegetables (raw, steamed, roasted, or grilled). Green salads. Fruits All fresh, canned (in natural juice), or frozen fruits. Meat and Other Protein Products Ground beef (85% or leaner), grass-fed beef, or beef trimmed of fat. Skinless chicken or [place]. Ground chicken or [place]. Pork trimmed of fat. All fish and seafood. Eggs. Dried beans, peas, or lentils. Unsalted nuts or seeds. Unsalted canned or dry beans. Dairy Low-fat dairy products, such as skim or 1% milk, 2% or reduced-fat cheeses, low-fat ricotta or cottage cheese, or plain low-fat yogurt. Fats and Oils Tub margarines without trans fats. Light or reduced-fat mayonnaise and salad dressings. Avocado. Safflower, olive, or canola oils. Natural peanut or almond butter. The items listed above may not be a complete list of recommended foods or beverages. Contact your dietitian for more options. WHAT FOODS ARE NOT RECOMMENDED?  Grains White bread. White pasta. White rice. Cornbread. Bagels, pastries, and croissants. Crackers that contain trans fat. Vegetables White potatoes. Corn. Creamed or fried vegetables. Vegetables in a cheese sauce. Fruits Dried fruits.  Canned fruit in light or heavy syrup. Fruit juice. Meat and Other Protein Products Fatty cuts of meat. Ribs, chicken wings, bacon, sausage, bologna, salami, chitterlings, fatback, hot dogs, bratwurst, and packaged luncheon meats. Dairy Whole or 2% milk, cream, half-and-half, and cream cheese. Whole-fat or sweetened yogurt. Full-fat cheeses. Nondairy creamers and whipped toppings. Processed cheese, cheese spreads, or cheese curds. Sweets and Desserts Corn syrup, sugars, honey, and molasses. Candy. Jam and jelly. Syrup. Sweetened cereals. Cookies, pies, cakes, donuts, muffins, and ice cream. Fats and Oils Butter, stick margarine, lard, shortening, ghee, or bacon fat. Coconut, palm kernel, or palm oils. Beverages Alcohol. Sweetened drinks (such as sodas, lemonade, and fruit drinks or punches). The items listed above may not be a complete list of foods and beverages to avoid. Contact your dietitian for more information. Document Released: 10/17/2003 Document Revised: 01/03/2013 Document Reviewed: 11/02/2012 [facility] Patient Information 2015 [place], [place]. This information is not intended to replace advice given to you by your health care provider. Make sure you discuss any questions you have with your health care provider.

## 2014-04-26 NOTE — Assessment & Plan Note (Signed)
Pt has nat been taking BP medications for the last 3 weeks. I will resume start on Norvasc. Will not resume zestoretic.

## 2014-04-26 NOTE — Assessment & Plan Note (Signed)
Pt states that she has been compliant with medication but not compliant with diet.

## 2014-04-27 LAB — MICROALBUMIN, URINE: Microalb, Ur: 1.2 mg/dL (ref <2.0)

## 2014-06-22 ENCOUNTER — Other Ambulatory Visit: Payer: Self-pay | Admitting: Family Medicine

## 2014-09-13 ENCOUNTER — Telehealth: Payer: Self-pay

## 2014-09-13 NOTE — Telephone Encounter (Signed)
Misha from [facility] called 09/13/2014 @8:55am saying she has been unable to get in touch with patient to schedule an appointment.   

## 2014-11-09 ENCOUNTER — Other Ambulatory Visit: Payer: Self-pay | Admitting: Infectious Diseases

## 2014-11-09 DIAGNOSIS — Z21 Asymptomatic human immunodeficiency virus [HIV] infection status: Secondary | ICD-10-CM

## 2014-11-14 ENCOUNTER — Other Ambulatory Visit: Payer: 59

## 2014-11-14 DIAGNOSIS — Z21 Asymptomatic human immunodeficiency virus [HIV] infection status: Secondary | ICD-10-CM

## 2014-11-14 LAB — COMPREHENSIVE METABOLIC PANEL
ALT: 14 U/L (ref 6–29)
AST: 13 U/L (ref 10–35)
Albumin: 3.6 g/dL (ref 3.6–5.1)
Alkaline Phosphatase: 95 U/L (ref 33–115)
BUN: 14 mg/dL (ref 7–25)
CALCIUM: 8.9 mg/dL (ref 8.6–10.2)
CHLORIDE: 103 mmol/L (ref 98–110)
CO2: 26 mmol/L (ref 20–31)
Creat: 0.63 mg/dL (ref 0.50–1.10)
GLUCOSE: 280 mg/dL — AB (ref 65–99)
POTASSIUM: 3.9 mmol/L (ref 3.5–5.3)
Sodium: 137 mmol/L (ref 135–146)
TOTAL PROTEIN: 7 g/dL (ref 6.1–8.1)
Total Bilirubin: 0.7 mg/dL (ref 0.2–1.2)

## 2014-11-14 LAB — CBC WITH DIFFERENTIAL/PLATELET
BASOS PCT: 0 % (ref 0–1)
Basophils Absolute: 0 10*3/uL (ref 0.0–0.1)
Eosinophils Absolute: 0.1 10*3/uL (ref 0.0–0.7)
Eosinophils Relative: 2 % (ref 0–5)
HEMATOCRIT: 40.4 % (ref 36.0–46.0)
HEMOGLOBIN: 12.7 g/dL (ref 12.0–15.0)
LYMPHS ABS: 2.2 10*3/uL (ref 0.7–4.0)
LYMPHS PCT: 50 % — AB (ref 12–46)
MCH: 27.8 pg (ref 26.0–34.0)
MCHC: 31.4 g/dL (ref 30.0–36.0)
MCV: 88.4 fL (ref 78.0–100.0)
MONO ABS: 0.4 10*3/uL (ref 0.1–1.0)
MONOS PCT: 10 % (ref 3–12)
MPV: 10.3 fL (ref 8.6–12.4)
NEUTROS ABS: 1.6 10*3/uL — AB (ref 1.7–7.7)
NEUTROS PCT: 38 % — AB (ref 43–77)
Platelets: 323 10*3/uL (ref 150–400)
RBC: 4.57 MIL/uL (ref 3.87–5.11)
RDW: 13.4 % (ref 11.5–15.5)
WBC: 4.3 10*3/uL (ref 4.0–10.5)

## 2014-11-15 LAB — RPR

## 2014-11-15 LAB — T-HELPER CELL (CD4) - (RCID CLINIC ONLY)
CD4 T CELL HELPER: 21 % — AB (ref 33–55)
CD4 T Cell Abs: 450 /uL (ref 400–2700)

## 2014-11-16 LAB — URINE CYTOLOGY ANCILLARY ONLY
Chlamydia: NEGATIVE
Neisseria Gonorrhea: NEGATIVE
Trichomonas: NEGATIVE

## 2014-11-18 LAB — HIV-1 RNA QUANT-NO REFLEX-BLD
HIV 1 RNA Quant: 3094 copies/mL — ABNORMAL HIGH (ref <20)
HIV-1 RNA Quant, Log: 3.49 Log copies/mL — ABNORMAL HIGH (ref <1.30)

## 2014-12-10 ENCOUNTER — Ambulatory Visit (INDEPENDENT_AMBULATORY_CARE_PROVIDER_SITE_OTHER): Payer: 59 | Admitting: Infectious Diseases

## 2014-12-10 ENCOUNTER — Encounter: Payer: Self-pay | Admitting: *Deleted

## 2014-12-10 ENCOUNTER — Encounter: Payer: Self-pay | Admitting: Infectious Diseases

## 2014-12-10 VITALS — BP 160/103 | HR 83 | Temp 97.9°F | Wt 215.0 lb

## 2014-12-10 DIAGNOSIS — Z113 Encounter for screening for infections with a predominantly sexual mode of transmission: Secondary | ICD-10-CM

## 2014-12-10 DIAGNOSIS — I10 Essential (primary) hypertension: Secondary | ICD-10-CM

## 2014-12-10 DIAGNOSIS — E118 Type 2 diabetes mellitus with unspecified complications: Secondary | ICD-10-CM

## 2014-12-10 DIAGNOSIS — Z79899 Other long term (current) drug therapy: Secondary | ICD-10-CM

## 2014-12-10 DIAGNOSIS — B20 Human immunodeficiency virus [HIV] disease: Secondary | ICD-10-CM | POA: Diagnosis not present

## 2014-12-10 DIAGNOSIS — IMO0002 Reserved for concepts with insufficient information to code with codable children: Secondary | ICD-10-CM

## 2014-12-10 DIAGNOSIS — E1165 Type 2 diabetes mellitus with hyperglycemia: Secondary | ICD-10-CM

## 2014-12-10 MED ORDER — EMTRICITAB-RILPIVIR-TENOFOV AF 200-25-25 MG PO TABS: 1.0000 | ORAL_TABLET | Freq: Every day | ORAL | Status: DC

## 2014-12-10 NOTE — Progress Notes (Signed)
   Subjective:    Patient ID: Katrina Parker, female    DOB: May 28, 1970, 44 y.o.   MRN: [id number]  HPI 44 yo F with hx of HTN, DM2 for 1 year.  and HIV+.Has prev been on atripla --> triumeq.   Hep B immune.    Has been off all medications, states she was "cured" at her church (per her CM). Has been feeling well.  States she quit her ART because it gave her back pain.   HIV 1 RNA QUANT (copies/mL)  Date Value  11/14/2014 3094*  09/12/2013 254*  08/30/2013 197*   CD4 T CELL ABS (/uL)  Date Value  11/14/2014 450  08/14/2013 380*  04/10/2013 500   Has not had f/u with MD.   Review of Systems  Constitutional: Negative for appetite change and unexpected weight change.  Respiratory: Negative for cough and shortness of breath.   Cardiovascular: Negative for chest pain.  Gastrointestinal: Negative for diarrhea and constipation.  Genitourinary: Negative for urgency and difficulty urinating.  Neurological: Negative for dizziness, numbness and headaches.       Objective:   Physical Exam  Constitutional: She appears well-developed and well-nourished.  HENT:  Mouth/Throat: No oropharyngeal exudate.  Eyes: EOM are normal. Pupils are equal, round, and reactive to light.  Neck: Neck supple.  Cardiovascular: Normal rate, regular rhythm and normal heart sounds.   Pulmonary/Chest: Effort normal and breath sounds normal.  Abdominal: Soft. Bowel sounds are normal. There is no tenderness. There is no rebound.  Musculoskeletal: She exhibits no edema.  Lymphadenopathy:    She has no cervical adenopathy.       Assessment & Plan:

## 2014-12-10 NOTE — Assessment & Plan Note (Signed)
Stressed pt's need to f/u with PCP.

## 2014-12-10 NOTE — Assessment & Plan Note (Signed)
Will change her meds to odefsy Will see her back in 3 months She states she will take the medication.  She is not sexually active She has gotten flu shot.

## 2014-12-10 NOTE — Assessment & Plan Note (Signed)
I suggested she needs to get back with PCP I suggested she is at risk for MI +/o CVA. She states she is fine and does not need rx.

## 2014-12-10 NOTE — Progress Notes (Signed)
Patient ID: Katrina Parker, female   DOB: 11-Jan-1971, 44 y.o.   MRN: [id number] PA completed on CoverMyMeds for Odefsey.  May take up to 72 hours for response.

## 2015-02-27 ENCOUNTER — Other Ambulatory Visit: Payer: 59

## 2015-02-27 DIAGNOSIS — Z113 Encounter for screening for infections with a predominantly sexual mode of transmission: Secondary | ICD-10-CM | POA: Diagnosis not present

## 2015-02-27 DIAGNOSIS — B20 Human immunodeficiency virus [HIV] disease: Secondary | ICD-10-CM | POA: Diagnosis not present

## 2015-02-27 DIAGNOSIS — Z79899 Other long term (current) drug therapy: Secondary | ICD-10-CM

## 2015-02-27 LAB — LIPID PANEL
CHOLESTEROL: 174 mg/dL (ref 125–200)
HDL: 59 mg/dL (ref >=46)
LDL Cholesterol: 94 mg/dL (ref <130)
TRIGLYCERIDES: 105 mg/dL (ref <150)
Total CHOL/HDL Ratio: 2.9 Ratio (ref <=5.0)
VLDL: 21 mg/dL (ref <30)

## 2015-02-27 LAB — COMPLETE METABOLIC PANEL WITH GFR
ALBUMIN: 3.5 g/dL — AB (ref 3.6–5.1)
ALK PHOS: 95 U/L (ref 33–115)
ALT: 19 U/L (ref 6–29)
AST: 18 U/L (ref 10–35)
BILIRUBIN TOTAL: 0.6 mg/dL (ref 0.2–1.2)
BUN: 12 mg/dL (ref 7–25)
CALCIUM: 8.9 mg/dL (ref 8.6–10.2)
CO2: 25 mmol/L (ref 20–31)
Chloride: 102 mmol/L (ref 98–110)
Creat: 0.64 mg/dL (ref 0.50–1.10)
GLUCOSE: 364 mg/dL — AB (ref 65–99)
Potassium: 4.2 mmol/L (ref 3.5–5.3)
Sodium: 137 mmol/L (ref 135–146)
TOTAL PROTEIN: 7.4 g/dL (ref 6.1–8.1)

## 2015-02-27 LAB — CBC
HEMATOCRIT: 40.7 % (ref 36.0–46.0)
HEMOGLOBIN: 13 g/dL (ref 12.0–15.0)
MCH: 27.4 pg (ref 26.0–34.0)
MCHC: 31.9 g/dL (ref 30.0–36.0)
MCV: 85.9 fL (ref 78.0–100.0)
MPV: 11 fL (ref 8.6–12.4)
Platelets: 259 10*3/uL (ref 150–400)
RBC: 4.74 MIL/uL (ref 3.87–5.11)
RDW: 13.5 % (ref 11.5–15.5)
WBC: 4.7 10*3/uL (ref 4.0–10.5)

## 2015-02-27 NOTE — Addendum Note (Signed)
Addended by: Dolan Amen D on: 02/27/2015 12:15 PM   Modules accepted: Orders

## 2015-02-28 LAB — URINE CYTOLOGY ANCILLARY ONLY
CHLAMYDIA, DNA PROBE: NEGATIVE
NEISSERIA GONORRHEA: NEGATIVE

## 2015-02-28 LAB — RPR

## 2015-02-28 LAB — HIV-1 RNA QUANT-NO REFLEX-BLD
HIV 1 RNA Quant: 3693 copies/mL — ABNORMAL HIGH (ref <20)
HIV-1 RNA Quant, Log: 3.57 Log copies/mL — ABNORMAL HIGH (ref <1.30)

## 2015-02-28 LAB — T-HELPER CELL (CD4) - (RCID CLINIC ONLY)
CD4 % Helper T Cell: 19 % — ABNORMAL LOW (ref 33–55)
CD4 T Cell Abs: 470 /uL (ref 400–2700)

## 2015-03-07 ENCOUNTER — Telehealth: Payer: Self-pay | Admitting: *Deleted

## 2015-03-07 NOTE — Telephone Encounter (Signed)
RN received a referral from [facility]. The plan is to meet with the patient and offer services during her next visit with Dr Johnnye Sima on 03/01

## 2015-03-13 ENCOUNTER — Ambulatory Visit: Payer: Self-pay | Admitting: *Deleted

## 2015-03-13 ENCOUNTER — Ambulatory Visit (INDEPENDENT_AMBULATORY_CARE_PROVIDER_SITE_OTHER): Payer: 59 | Admitting: Infectious Diseases

## 2015-03-13 VITALS — BP 151/99 | HR 90 | Temp 98.1°F | Ht 62.0 in | Wt 212.2 lb

## 2015-03-13 DIAGNOSIS — B20 Human immunodeficiency virus [HIV] disease: Secondary | ICD-10-CM

## 2015-03-13 DIAGNOSIS — Z23 Encounter for immunization: Secondary | ICD-10-CM

## 2015-03-13 DIAGNOSIS — E1165 Type 2 diabetes mellitus with hyperglycemia: Secondary | ICD-10-CM

## 2015-03-13 DIAGNOSIS — I10 Essential (primary) hypertension: Secondary | ICD-10-CM

## 2015-03-13 DIAGNOSIS — E1149 Type 2 diabetes mellitus with other diabetic neurological complication: Secondary | ICD-10-CM

## 2015-03-13 DIAGNOSIS — IMO0002 Reserved for concepts with insufficient information to code with codable children: Secondary | ICD-10-CM

## 2015-03-13 MED ORDER — ELVITEG-COBIC-EMTRICIT-TENOFAF 150-150-200-10 MG PO TABS: 1.0000 | ORAL_TABLET | Freq: Every day | ORAL | Status: DC

## 2015-03-13 MED FILL — GENVOYA TABLET: 150-150-200 | 90 days supply | Qty: 90 | Fill #0

## 2015-03-13 NOTE — Progress Notes (Signed)
   Subjective:    Patient ID: Katrina Parker, female    DOB: [birth year], 45 y.o.   MRN: [id number]  HPI 45 yo F with hx of HTN, DM2 for 1 year. and HIV+.Has prev been on atripla --> triumeq.--> odefsy  Hep B immune.  At her prev visit was put on odefsy, she quit taking this as she stated it gave her a rash behind her L knee.   HIV 1 RNA QUANT (copies/mL)  Date Value  02/27/2015 3693*  11/14/2014 3094*  09/12/2013 254*   CD4 T CELL ABS (/uL)  Date Value  02/27/2015 470  11/14/2014 450  08/14/2013 380*   Has not had PCP f/u for her DM or HTN.  Lat PAP, mammo in 2014.  Has occas numbness in her feet with prolonged standing.   Review of Systems  Constitutional: Positive for fatigue.  Respiratory: Negative for shortness of breath.   Cardiovascular: Negative for chest pain.  Gastrointestinal: Negative for nausea and diarrhea.  Genitourinary: Positive for frequency. Negative for difficulty urinating.  Neurological: Positive for headaches.       Objective:   Physical Exam  Constitutional: She appears well-developed and well-nourished.  HENT:  Mouth/Throat: No oropharyngeal exudate.  Eyes: EOM are normal. Pupils are equal, round, and reactive to light.  Neck: Neck supple.  Cardiovascular: Normal rate, regular rhythm and normal heart sounds.   Pulmonary/Chest: Effort normal and breath sounds normal.  Abdominal: Soft. Bowel sounds are normal. There is no tenderness. There is no rebound.  Musculoskeletal: She exhibits no edema.  No light touch, no diabetic foot lesions.   Lymphadenopathy:    She has no cervical adenopathy.          Assessment & Plan:

## 2015-03-13 NOTE — Assessment & Plan Note (Signed)
Needs to f/u with PCP

## 2015-03-13 NOTE — Addendum Note (Signed)
Addended by: Rodman Key A on: 03/13/2015 09:59 AM   Modules accepted: Orders

## 2015-03-13 NOTE — Assessment & Plan Note (Signed)
[facility]

## 2015-03-13 NOTE — Assessment & Plan Note (Signed)
Will start her on genvoya Offered/refused condoms.  Appreciate Ambre and Mitch f/u Will see her back in 2 months Will get Pap, Mammo Will help her with her bill Needs tdap booster.  Can get varicella at pharm or PCP Will check her quantiferon

## 2015-03-16 LAB — QUANTIFERON TB GOLD ASSAY (BLOOD)
INTERFERON GAMMA RELEASE ASSAY: NEGATIVE
Mitogen-Nil: 9.21 IU/mL
QUANTIFERON NIL VALUE: 0.08 [IU]/mL
Quantiferon Tb Ag Minus Nil Value: <0 IU/mL

## 2015-03-19 NOTE — Progress Notes (Signed)
Patient ID: Katrina Parker, female   DOB: [birth year], 45 y.o.   MRN: [id number] RN meet with the patient after her visit with Dr Johnnye Sima at the clinic to offer services. RN explained to the patient the many services that I can provide. Katrina Parker sounded interested but stated she did not want me to come to her home and preferred for me to arrange visits with her at the clinic. This will be the plan to schedule appts with the patient at the clinic to discuss barrier to medication adherence

## 2015-03-19 NOTE — Patient Instructions (Signed)
Please refer to progress note for details of today's visit 

## 2015-03-25 ENCOUNTER — Telehealth: Payer: Self-pay | Admitting: *Deleted

## 2015-03-25 NOTE — Telephone Encounter (Signed)
RN received a return call back from the patient agreeing to meet tomorrow morning. RN asked the patient to please bring all of her medications to his visit

## 2015-03-25 NOTE — Telephone Encounter (Signed)
RN contacted the patient and left a message asking the patient if we could arrange a visit at the clinic at [place]. During our face to face the patient stated she would prefer to meet at the clinic only 1st thing in the morning.

## 2015-03-26 ENCOUNTER — Ambulatory Visit: Payer: 59 | Admitting: *Deleted

## 2015-03-26 VITALS — BP 150/92 | HR 82 | Temp 98.2°F | Resp 17

## 2015-03-26 DIAGNOSIS — IMO0002 Reserved for concepts with insufficient information to code with codable children: Secondary | ICD-10-CM

## 2015-03-26 DIAGNOSIS — B20 Human immunodeficiency virus [HIV] disease: Secondary | ICD-10-CM

## 2015-03-26 DIAGNOSIS — E1165 Type 2 diabetes mellitus with hyperglycemia: Secondary | ICD-10-CM

## 2015-03-26 DIAGNOSIS — I10 Essential (primary) hypertension: Secondary | ICD-10-CM

## 2015-03-26 DIAGNOSIS — E1149 Type 2 diabetes mellitus with other diabetic neurological complication: Secondary | ICD-10-CM

## 2015-04-02 NOTE — Patient Instructions (Signed)
Please refer to progress note for details of today's visit and education provided 

## 2015-04-02 NOTE — Progress Notes (Signed)
Patient ID: Katrina Parker, female   DOB: 1970/12/04, 45 y.o.   MRN: [id number] RN meet with the patient at the clinic. Patient apologized for arriving at 9am instead of our prescheduled 8am time. During our visit today patient expressed that she takes her Genvoya each day but does not feel that she should have to take her medications to control her diabetes or Hypertension. Patient stated she has began to take a natural remedy of Bragg's vinegar, honey, and lemon to manage her diabetes and hypertension. Patient stated she continues to pray to be healed and watches [employer] via the internet each day for devotion and encouragement. RN expressed to the patient that I will also pray for her but advised the patient that I feel GOD wants [place] to take care of our bodies by exercising, eating a healthy diet and maintaining a healthy lifestyle. RN informed that patient that I do believe GOD can heal all things but he does not like for use to sit idle and continue to abuse out bodies with fried foods, no exercise and not managing our disease processes. Patient stated she hates to walk and does not like the insulin needles. RN listened actively to the patient and verbalized a understanding for what she is saying. RN urged the patient to consider praying along with taking care of her body as well. RN offered to meet the patient the the following week if she would like to discuss her progress or any barriers she encounters. Patient stated she does not want to meet every week but would be willing to meet again in 2 weeks on a Friday at 8am. At this time I have not consented the patient for home visits since she does not want it and may not be able to commit to scheduled visit at the clinic

## 2015-04-17 DIAGNOSIS — Z209 Contact with and (suspected) exposure to unspecified communicable disease: Secondary | ICD-10-CM | POA: Diagnosis not present

## 2015-04-17 DIAGNOSIS — Z202 Contact with and (suspected) exposure to infections with a predominantly sexual mode of transmission: Secondary | ICD-10-CM | POA: Diagnosis not present

## 2015-04-17 DIAGNOSIS — Z201 Contact with and (suspected) exposure to tuberculosis: Secondary | ICD-10-CM | POA: Diagnosis not present

## 2015-04-17 DIAGNOSIS — Z20820 Contact with and (suspected) exposure to varicella: Secondary | ICD-10-CM | POA: Diagnosis not present

## 2015-04-24 DIAGNOSIS — Z23 Encounter for immunization: Secondary | ICD-10-CM | POA: Diagnosis not present

## 2015-05-15 ENCOUNTER — Other Ambulatory Visit: Payer: Self-pay | Admitting: Infectious Diseases

## 2015-05-15 ENCOUNTER — Other Ambulatory Visit: Payer: 59

## 2015-05-15 DIAGNOSIS — Z21 Asymptomatic human immunodeficiency virus [HIV] infection status: Secondary | ICD-10-CM

## 2015-05-15 LAB — CBC WITH DIFFERENTIAL/PLATELET
BASOS ABS: 0 {cells}/uL (ref 0–200)
Basophils Relative: 0 %
EOS ABS: 42 {cells}/uL (ref 15–500)
EOS PCT: 1 %
HEMATOCRIT: 40.4 % (ref 35.0–45.0)
HEMOGLOBIN: 12.7 g/dL (ref 11.7–15.5)
LYMPHS ABS: 1974 {cells}/uL (ref 850–3900)
Lymphocytes Relative: 47 %
MCH: 27.9 pg (ref 27.0–33.0)
MCHC: 31.4 g/dL — ABNORMAL LOW (ref 32.0–36.0)
MCV: 88.8 fL (ref 80.0–100.0)
MONOS PCT: 11 %
MPV: 10.4 fL (ref 7.5–12.5)
Monocytes Absolute: 462 cells/uL (ref 200–950)
NEUTROS ABS: 1722 {cells}/uL (ref 1500–7800)
NEUTROS PCT: 41 %
Platelets: 279 10*3/uL (ref 140–400)
RBC: 4.55 MIL/uL (ref 3.80–5.10)
RDW: 14 % (ref 11.0–15.0)
WBC: 4.2 10*3/uL (ref 3.8–10.8)

## 2015-05-15 LAB — COMPREHENSIVE METABOLIC PANEL
ALK PHOS: 91 U/L (ref 33–115)
ALT: 12 U/L (ref 6–29)
AST: 12 U/L (ref 10–35)
Albumin: 3.7 g/dL (ref 3.6–5.1)
BILIRUBIN TOTAL: 0.5 mg/dL (ref 0.2–1.2)
BUN: 19 mg/dL (ref 7–25)
CALCIUM: 9.5 mg/dL (ref 8.6–10.2)
CO2: 23 mmol/L (ref 20–31)
Chloride: 102 mmol/L (ref 98–110)
Creat: 0.75 mg/dL (ref 0.50–1.10)
GLUCOSE: 331 mg/dL — AB (ref 65–99)
Potassium: 4 mmol/L (ref 3.5–5.3)
Sodium: 135 mmol/L (ref 135–146)
Total Protein: 7.5 g/dL (ref 6.1–8.1)

## 2015-05-16 LAB — HIV-1 RNA QUANT-NO REFLEX-BLD: HIV-1 RNA Quant, Log: <1.3 Log copies/mL (ref <1.30)

## 2015-05-16 LAB — T-HELPER CELL (CD4) - (RCID CLINIC ONLY)
CD4 T CELL ABS: 500 /uL (ref 400–2700)
CD4 T CELL HELPER: 24 % — AB (ref 33–55)

## 2015-06-05 ENCOUNTER — Ambulatory Visit: Payer: 59 | Admitting: Infectious Diseases

## 2015-06-14 MED FILL — GENVOYA TABLET: 150-150-200 | 90 days supply | Qty: 90 | Fill #1

## 2015-06-26 ENCOUNTER — Encounter: Payer: Self-pay | Admitting: Infectious Diseases

## 2015-06-26 ENCOUNTER — Ambulatory Visit (INDEPENDENT_AMBULATORY_CARE_PROVIDER_SITE_OTHER): Payer: 59 | Admitting: Infectious Diseases

## 2015-06-26 VITALS — BP 156/102 | HR 89 | Temp 98.7°F | Wt 220.0 lb

## 2015-06-26 DIAGNOSIS — E1165 Type 2 diabetes mellitus with hyperglycemia: Secondary | ICD-10-CM | POA: Diagnosis not present

## 2015-06-26 DIAGNOSIS — B20 Human immunodeficiency virus [HIV] disease: Secondary | ICD-10-CM | POA: Diagnosis not present

## 2015-06-26 DIAGNOSIS — E1149 Type 2 diabetes mellitus with other diabetic neurological complication: Secondary | ICD-10-CM | POA: Diagnosis not present

## 2015-06-26 DIAGNOSIS — I10 Essential (primary) hypertension: Secondary | ICD-10-CM

## 2015-06-26 DIAGNOSIS — IMO0002 Reserved for concepts with insufficient information to code with codable children: Secondary | ICD-10-CM

## 2015-06-26 NOTE — Assessment & Plan Note (Signed)
She refuses PAP and mammo She refuses PCP f/u She refuses condoms She is doing well on genvoya.  Will see her back in 6  Months.

## 2015-06-26 NOTE — Assessment & Plan Note (Signed)
She believes that God will make her Bp come down.  I reiterate her to that her BP is a threat to her health.

## 2015-06-26 NOTE — Assessment & Plan Note (Signed)
She refuses f/u and rx.

## 2015-06-26 NOTE — Progress Notes (Signed)
   Subjective:    Patient ID: Katrina Parker, female    DOB: April 14, 1970, 45 y.o.   MRN: [id number]  HPI 45 yo F with hx of HTN, DM2 since 2015. and HIV+.Has prev been on atripla --> triumeq.--> odefsy  Hep B immune.  At her prev visit was put on odefsy, she quit taking this as she stated it gave her a rash behind her L knee. She was then started on genvoya on 03-13-15.  She has been off all her meds (DM and HTN) except genvoya (which she likes!).  Has had f/u visits with Ambre.   HIV 1 RNA QUANT (copies/mL)  Date Value  05/15/2015 <20  02/27/2015 3693*  11/14/2014 3094*   CD4 T CELL ABS (/uL)  Date Value  05/15/2015 500  02/27/2015 470  11/14/2014 450   Does not check FSG at home.   Review of Systems  Constitutional: Negative for appetite change and unexpected weight change.  Respiratory: Negative for shortness of breath.   Cardiovascular: Negative for chest pain.  Gastrointestinal: Negative for diarrhea and constipation.  Genitourinary: Negative for difficulty urinating.  Neurological: Negative for dizziness, light-headedness and headaches.       Objective:   Physical Exam  Constitutional: She appears well-developed and well-nourished.  HENT:  Mouth/Throat: No oropharyngeal exudate.  Eyes: EOM are normal. Pupils are equal, round, and reactive to light.  Neck: Neck supple.  Cardiovascular: Normal rate, regular rhythm and normal heart sounds.   Pulmonary/Chest: Effort normal and breath sounds normal.  Abdominal: Soft. Bowel sounds are normal. There is no tenderness. There is no rebound.  Lymphadenopathy:    She has no cervical adenopathy.       Assessment & Plan:

## 2015-10-09 MED FILL — GENVOYA TABLET: 150-150-200 | 90 days supply | Qty: 90 | Fill #2

## 2016-01-22 ENCOUNTER — Ambulatory Visit (INDEPENDENT_AMBULATORY_CARE_PROVIDER_SITE_OTHER): Payer: 59 | Admitting: Infectious Diseases

## 2016-01-22 ENCOUNTER — Encounter: Payer: Self-pay | Admitting: Infectious Diseases

## 2016-01-22 VITALS — BP 152/96 | HR 71 | Temp 97.3°F | Ht 66.0 in | Wt 216.8 lb

## 2016-01-22 DIAGNOSIS — Z789 Other specified health status: Secondary | ICD-10-CM | POA: Diagnosis not present

## 2016-01-22 DIAGNOSIS — B20 Human immunodeficiency virus [HIV] disease: Secondary | ICD-10-CM

## 2016-01-22 DIAGNOSIS — Z79899 Other long term (current) drug therapy: Secondary | ICD-10-CM

## 2016-01-22 DIAGNOSIS — N760 Acute vaginitis: Secondary | ICD-10-CM

## 2016-01-22 DIAGNOSIS — E1149 Type 2 diabetes mellitus with other diabetic neurological complication: Secondary | ICD-10-CM | POA: Diagnosis not present

## 2016-01-22 DIAGNOSIS — I10 Essential (primary) hypertension: Secondary | ICD-10-CM | POA: Diagnosis not present

## 2016-01-22 DIAGNOSIS — Z113 Encounter for screening for infections with a predominantly sexual mode of transmission: Secondary | ICD-10-CM

## 2016-01-22 DIAGNOSIS — IMO0002 Reserved for concepts with insufficient information to code with codable children: Secondary | ICD-10-CM

## 2016-01-22 DIAGNOSIS — E1165 Type 2 diabetes mellitus with hyperglycemia: Secondary | ICD-10-CM

## 2016-01-22 MED ORDER — FLUCONAZOLE 100 MG PO TABS: 100.0000 mg | ORAL_TABLET | Freq: Every day | ORAL | 2 refills | Status: DC

## 2016-01-22 NOTE — Progress Notes (Signed)
   Subjective:    Patient ID: Katrina Parker, female    DOB: [birth year], 46 y.o.   MRN: [id number]  HPI 46 yo F from [place] since 2008, with hx of HTN, DM2 since 2015. and HIV+.Has prev been on atripla --> triumeq.--> odefsy--> genvoya (03-2015) She has been off all her meds (DM and HTN) except genvoya (which she likes!).    HIV 1 RNA Quant (copies/mL)  Date Value  05/15/2015 <20  02/27/2015 3,693 (H)  11/14/2014 3,094 (H)   CD4 T Cell Abs (/uL)  Date Value  05/15/2015 500  02/27/2015 470  11/14/2014 450   Feels well. 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 Takes ESL when she gets done with work.    Review of Systems  Constitutional: Negative for appetite change and unexpected weight change.  Respiratory: Negative for shortness of breath.   Cardiovascular: Negative for chest pain.  Gastrointestinal: Negative for constipation and diarrhea.  Genitourinary: Negative for difficulty urinating.  Neurological: Negative for headaches.       Objective:   Physical Exam  Constitutional: She appears well-developed and well-nourished.  HENT:  Mouth/Throat: No oropharyngeal exudate.  Eyes: EOM are normal. Pupils are equal, round, and reactive to light.  Neck: Neck supple.  Cardiovascular: Normal rate, regular rhythm and normal heart sounds.   Pulmonary/Chest: Effort normal and breath sounds normal.  Abdominal: Soft. Bowel sounds are normal. There is no tenderness. There is no rebound.  Musculoskeletal: She exhibits no edema.  Lymphadenopathy:    She has no cervical adenopathy.       Assessment & Plan:

## 2016-01-22 NOTE — Assessment & Plan Note (Signed)
BP elevated but she is asx.  She does not want to take rx

## 2016-01-22 NOTE — Assessment & Plan Note (Signed)
She does not want to take rx. States she feels fine.

## 2016-01-22 NOTE — Assessment & Plan Note (Addendum)
She does not want to have labs done today. Had to pay for her labs.  She will get her labs when her bill is paid off.  Offered/refused condoms.  Wants dental eval Has had flu shot Will give mening vax.  rtc in 6 months.

## 2016-01-22 NOTE — Assessment & Plan Note (Signed)
Will give her rx for diflucan.  Let her nknow that this could be related to her sugars, she states she has quit all sweetened bevarages, all sweets.

## 2016-02-07 MED FILL — FLUCONAZOLE 100 MG TABLET: 100 | 7 days supply | Qty: 7 | Fill #0

## 2016-07-27 ENCOUNTER — Encounter: Payer: Self-pay | Admitting: Infectious Diseases

## 2016-07-30 ENCOUNTER — Other Ambulatory Visit: Payer: 59

## 2016-07-30 DIAGNOSIS — B20 Human immunodeficiency virus [HIV] disease: Secondary | ICD-10-CM | POA: Diagnosis not present

## 2016-07-30 DIAGNOSIS — E1100 Type 2 diabetes mellitus with hyperosmolarity without nonketotic hyperglycemic-hyperosmolar coma (NKHHC): Secondary | ICD-10-CM

## 2016-07-30 DIAGNOSIS — Z79899 Other long term (current) drug therapy: Secondary | ICD-10-CM

## 2016-07-30 DIAGNOSIS — Z113 Encounter for screening for infections with a predominantly sexual mode of transmission: Secondary | ICD-10-CM

## 2016-07-30 LAB — COMPREHENSIVE METABOLIC PANEL
ALBUMIN: 3.5 g/dL — AB (ref 3.6–5.1)
ALK PHOS: 83 U/L (ref 33–115)
ALT: 19 U/L (ref 6–29)
AST: 15 U/L (ref 10–35)
BILIRUBIN TOTAL: 0.7 mg/dL (ref 0.2–1.2)
BUN: 12 mg/dL (ref 7–25)
CO2: 22 mmol/L (ref 20–31)
CREATININE: 0.59 mg/dL (ref 0.50–1.10)
Calcium: 8.6 mg/dL (ref 8.6–10.2)
Chloride: 102 mmol/L (ref 98–110)
Glucose, Bld: 319 mg/dL — ABNORMAL HIGH (ref 65–99)
Potassium: 4 mmol/L (ref 3.5–5.3)
SODIUM: 135 mmol/L (ref 135–146)
Total Protein: 7.1 g/dL (ref 6.1–8.1)

## 2016-07-30 LAB — CBC WITH DIFFERENTIAL/PLATELET
BASOS PCT: 0 %
Basophils Absolute: 0 cells/uL (ref 0–200)
EOS PCT: 2 %
Eosinophils Absolute: 76 cells/uL (ref 15–500)
HCT: 41.1 % (ref 35.0–45.0)
HEMOGLOBIN: 12.9 g/dL (ref 11.7–15.5)
LYMPHS ABS: 1938 {cells}/uL (ref 850–3900)
Lymphocytes Relative: 51 %
MCH: 27.8 pg (ref 27.0–33.0)
MCHC: 31.4 g/dL — AB (ref 32.0–36.0)
MCV: 88.6 fL (ref 80.0–100.0)
MONO ABS: 418 {cells}/uL (ref 200–950)
MPV: 10 fL (ref 7.5–12.5)
Monocytes Relative: 11 %
NEUTROS ABS: 1368 {cells}/uL — AB (ref 1500–7800)
NEUTROS PCT: 36 %
Platelets: 266 10*3/uL (ref 140–400)
RBC: 4.64 MIL/uL (ref 3.80–5.10)
RDW: 13.5 % (ref 11.0–15.0)
WBC: 3.8 10*3/uL (ref 3.8–10.8)

## 2016-07-30 LAB — LIPID PANEL
CHOL/HDL RATIO: 2.9 ratio (ref <5.0)
Cholesterol: 169 mg/dL (ref <200)
HDL: 59 mg/dL (ref >50)
LDL Cholesterol: 91 mg/dL (ref <100)
Triglycerides: 97 mg/dL (ref <150)
VLDL: 19 mg/dL (ref <30)

## 2016-07-31 LAB — HEMOGLOBIN A1C
Hgb A1c MFr Bld: 13.7 % — ABNORMAL HIGH (ref <5.7)
MEAN PLASMA GLUCOSE: 346 mg/dL

## 2016-07-31 LAB — URINE CYTOLOGY ANCILLARY ONLY
CHLAMYDIA, DNA PROBE: NEGATIVE
Neisseria Gonorrhea: NEGATIVE

## 2016-07-31 LAB — RPR

## 2016-07-31 LAB — T-HELPER CELL (CD4) - (RCID CLINIC ONLY)
CD4 % Helper T Cell: 21 % — ABNORMAL LOW (ref 33–55)
CD4 T Cell Abs: 399 /uL — ABNORMAL LOW (ref 400–2700)

## 2016-08-03 LAB — HIV-1 RNA QUANT-NO REFLEX-BLD
HIV 1 RNA Quant: 4130 copies/mL — ABNORMAL HIGH
HIV-1 RNA QUANT, LOG: 3.62 {Log_copies}/mL — AB

## 2016-10-14 ENCOUNTER — Ambulatory Visit: Payer: 59 | Admitting: Infectious Diseases

## 2017-10-20 DIAGNOSIS — H539 Unspecified visual disturbance: Secondary | ICD-10-CM | POA: Insufficient documentation

## 2017-10-20 DIAGNOSIS — J301 Allergic rhinitis due to pollen: Secondary | ICD-10-CM | POA: Insufficient documentation

## 2017-10-20 DIAGNOSIS — H109 Unspecified conjunctivitis: Secondary | ICD-10-CM | POA: Insufficient documentation

## 2020-08-27 ENCOUNTER — Other Ambulatory Visit: Payer: Self-pay

## 2020-08-27 ENCOUNTER — Other Ambulatory Visit (HOSPITAL_COMMUNITY): Payer: Self-pay

## 2020-08-27 ENCOUNTER — Ambulatory Visit (INDEPENDENT_AMBULATORY_CARE_PROVIDER_SITE_OTHER): Payer: 59 | Admitting: Emergency Medicine

## 2020-08-27 ENCOUNTER — Encounter: Payer: Self-pay | Admitting: Emergency Medicine

## 2020-08-27 VITALS — BP 162/88 | HR 86 | Temp 97.7°F | Ht 66.0 in | Wt 188.0 lb

## 2020-08-27 DIAGNOSIS — Z9114 Patient's other noncompliance with medication regimen: Secondary | ICD-10-CM

## 2020-08-27 DIAGNOSIS — E1159 Type 2 diabetes mellitus with other circulatory complications: Secondary | ICD-10-CM

## 2020-08-27 DIAGNOSIS — I10 Essential (primary) hypertension: Secondary | ICD-10-CM

## 2020-08-27 DIAGNOSIS — Z91148 Patient's other noncompliance with medication regimen for other reason: Secondary | ICD-10-CM

## 2020-08-27 DIAGNOSIS — E1165 Type 2 diabetes mellitus with hyperglycemia: Secondary | ICD-10-CM | POA: Diagnosis not present

## 2020-08-27 DIAGNOSIS — B20 Human immunodeficiency virus [HIV] disease: Secondary | ICD-10-CM | POA: Diagnosis not present

## 2020-08-27 DIAGNOSIS — E785 Hyperlipidemia, unspecified: Secondary | ICD-10-CM

## 2020-08-27 DIAGNOSIS — I152 Hypertension secondary to endocrine disorders: Secondary | ICD-10-CM

## 2020-08-27 LAB — CBC WITH DIFFERENTIAL/PLATELET
Basophils Absolute: 0 10*3/uL (ref 0.0–0.1)
Basophils Relative: 0.3 % (ref 0.0–3.0)
Eosinophils Absolute: 0.1 10*3/uL (ref 0.0–0.7)
Eosinophils Relative: 1.4 % (ref 0.0–5.0)
HCT: 38.9 % (ref 36.0–46.0)
Hemoglobin: 12.9 g/dL (ref 12.0–15.0)
Lymphocytes Relative: 37.7 % (ref 12.0–46.0)
Lymphs Abs: 1.4 10*3/uL (ref 0.7–4.0)
MCHC: 33.1 g/dL (ref 30.0–36.0)
MCV: 84.6 fl (ref 78.0–100.0)
Monocytes Absolute: 0.5 10*3/uL (ref 0.1–1.0)
Monocytes Relative: 12.5 % — ABNORMAL HIGH (ref 3.0–12.0)
Neutro Abs: 1.8 10*3/uL (ref 1.4–7.7)
Neutrophils Relative %: 48.1 % (ref 43.0–77.0)
Platelets: 268 10*3/uL (ref 150.0–400.0)
RBC: 4.6 Mil/uL (ref 3.87–5.11)
RDW: 13.9 % (ref 11.5–15.5)
WBC: 3.6 10*3/uL — ABNORMAL LOW (ref 4.0–10.5)

## 2020-08-27 LAB — COMPREHENSIVE METABOLIC PANEL
ALT: 14 U/L (ref 0–35)
AST: 14 U/L (ref 0–37)
Albumin: 3.8 g/dL (ref 3.5–5.2)
Alkaline Phosphatase: 99 U/L (ref 39–117)
BUN: 20 mg/dL (ref 6–23)
CO2: 27 mEq/L (ref 19–32)
Calcium: 9.8 mg/dL (ref 8.4–10.5)
Chloride: 100 mEq/L (ref 96–112)
Creatinine, Ser: 0.64 mg/dL (ref 0.40–1.20)
GFR: 101.65 mL/min (ref >60.00)
Glucose, Bld: 410 mg/dL — ABNORMAL HIGH (ref 70–99)
Potassium: 4.2 mEq/L (ref 3.5–5.1)
Sodium: 133 mEq/L — ABNORMAL LOW (ref 135–145)
Total Bilirubin: 0.5 mg/dL (ref 0.2–1.2)
Total Protein: 8.4 g/dL — ABNORMAL HIGH (ref 6.0–8.3)

## 2020-08-27 LAB — LIPID PANEL
Cholesterol: 189 mg/dL (ref 0–200)
HDL: 64 mg/dL (ref >39.00)
LDL Cholesterol: 102 mg/dL — ABNORMAL HIGH (ref 0–99)
NonHDL: 124.54
Total CHOL/HDL Ratio: 3
Triglycerides: 113 mg/dL (ref 0.0–149.0)
VLDL: 22.6 mg/dL (ref 0.0–40.0)

## 2020-08-27 LAB — POCT GLYCOSYLATED HEMOGLOBIN (HGB A1C): Hemoglobin A1C: 11.7 % — AB (ref 4.0–5.6)

## 2020-08-27 MED ORDER — EMPAGLIFLOZIN 10 MG PO TABS
10.0000 mg | ORAL_TABLET | Freq: Every day | ORAL | 1 refills | Status: DC
  Filled 2020-08-27 – 2020-08-29 (×2): qty 90, 90d supply, fill #0
  Filled 2020-12-04: qty 90, 90d supply, fill #1

## 2020-08-27 MED ORDER — METFORMIN HCL 500 MG PO TABS
500.0000 mg | ORAL_TABLET | Freq: Two times a day (BID) | ORAL | 3 refills | Status: DC
  Filled 2020-08-27: qty 180, 90d supply, fill #0
  Filled 2020-12-04: qty 180, 90d supply, fill #1
  Filled 2021-03-07: qty 180, 90d supply, fill #2
  Filled 2021-06-06: qty 180, 90d supply, fill #3

## 2020-08-27 MED ORDER — LISINOPRIL-HYDROCHLOROTHIAZIDE 10-12.5 MG PO TABS
1.0000 | ORAL_TABLET | Freq: Every day | ORAL | 3 refills | Status: DC
  Filled 2020-08-27: qty 90, 90d supply, fill #0
  Filled 2020-12-04: qty 90, 90d supply, fill #1
  Filled 2021-03-07: qty 90, 90d supply, fill #2
  Filled 2021-06-06: qty 90, 90d supply, fill #3

## 2020-08-27 MED ORDER — AMLODIPINE BESYLATE 5 MG PO TABS
5.0000 mg | ORAL_TABLET | Freq: Every day | ORAL | 3 refills | Status: DC
  Filled 2020-08-27: qty 90, 90d supply, fill #0
  Filled 2020-12-04: qty 90, 90d supply, fill #1
  Filled 2021-03-07: qty 90, 90d supply, fill #2
  Filled 2021-06-06: qty 90, 90d supply, fill #3

## 2020-08-27 MED ORDER — ATORVASTATIN CALCIUM 20 MG PO TABS
20.0000 mg | ORAL_TABLET | Freq: Every day | ORAL | 3 refills | Status: DC
  Filled 2020-08-27: qty 90, 90d supply, fill #0
  Filled 2020-12-04: qty 90, 90d supply, fill #1
  Filled 2021-03-07: qty 90, 90d supply, fill #2
  Filled 2021-06-06: qty 90, 90d supply, fill #3

## 2020-08-27 NOTE — Progress Notes (Signed)
Katrina Parker 50 y.o.   Chief Complaint  Patient presents with   New Patient (Initial Visit)    Establish care    HISTORY OF PRESENT ILLNESS: This is a 50 y.o. female first visit to this office, here to establish care with me. Patient has longstanding history of HIV disease, on medication.  Last available infectious disease office visit notes from 01/22/2016. Patient has history of diabetes and hypertension, presently on no medications, poorly controlled. No specific complaints or medical concerns today. Originally from [place].  HPI   Prior to Admission medications   Medication Sig Start Date End Date Taking? Authorizing Provider  amLODipine (NORVASC) 5 MG tablet Take 1 tablet (5 mg total) by mouth daily. 08/27/20  Yes Isack Lavalley, Ines Bloomer, MD  elvitegravir-cobicistat-emtricitabine-tenofovir (GENVOYA) 150-150-200-10 MG TABS tablet Take 1 tablet by mouth daily with breakfast. 03/13/15  Yes Campbell Riches, MD  empagliflozin (JARDIANCE) 10 MG TABS tablet Take 1 tablet (10 mg total) by mouth daily before breakfast. 08/27/20 11/25/20 Yes Lauryl Seyer, Ines Bloomer, MD  metFORMIN (GLUCOPHAGE) 500 MG tablet Take 1 tablet (500 mg total) by mouth 2 (two) times daily with a meal. 08/27/20  Yes Amora Sheehy, Ines Bloomer, MD  atorvastatin (LIPITOR) 20 MG tablet Take 1 tablet (20 mg total) by mouth daily. Patient not taking: No sig reported 04/26/14   Leana Gamer, MD  fluconazole (DIFLUCAN) 100 MG tablet Take 1 tablet (100 mg total) by mouth daily. Patient not taking: Reported on 08/27/2020 01/22/16   Campbell Riches, MD  glucose monitoring kit (FREESTYLE) monitoring kit 1 each by Does not apply route 2 (two) times daily at 8 am and 10 pm. Patient not taking: Reported on 08/27/2020 09/14/13   Dorena Dew, FNP  lisinopril-hydrochlorothiazide (ZESTORETIC) 10-12.5 MG tablet Take 1 tablet by mouth daily. 08/27/20   Horald Pollen, MD  ONE TOUCH ULTRA TEST test strip Reported on  06/26/2015 Patient not taking: Reported on 08/27/2020 11/03/13   [provider]    Allergies  Allergen Reactions   Ibuprofen Rash    Patient Active Problem List   Diagnosis Date Noted   Diabetes education, encounter for 09/21/2013   Blurred vision, bilateral 09/21/2013   Diabetes type 2, uncontrolled ([place]) 09/13/2013   Other and unspecified hyperlipidemia 09/13/2013   Obesity 09/13/2013   Other screening mammogram 09/13/2013   History of uterine fibroid 09/13/2013   Vaginitis and vulvovaginitis 08/30/2013   Chest pain 04/24/2013   Essential hypertension, benign 02/22/2012   Rash and nonspecific skin eruption 02/22/2012   Chronic right hip pain 02/22/2012   Insomnia 03/20/2011   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/12/2010   Post herpetic neuralgia 05/20/2010   HEADACHE 02/24/2008   ABDOMINAL WALL HERNIA 12/24/2006   Human immunodeficiency virus (HIV) disease ([place]) 09/29/2006   MENSTRUAL DISORDER 09/29/2006    Past Medical History:  Diagnosis Date   Diabetes mellitus without complication ([place])    HIV (human immunodeficiency virus infection) ([place])     Past Surgical History:  Procedure Laterality Date   OTHER SURGICAL HISTORY     2 abdominal surgeries (1 in [place], 1 in [place]; unsure kind of procedure, states there might have been a "tumor")    Social History   Socioeconomic History   Marital status: Widowed    Spouse name: Not on file   Number of children: Not on file   Years of education: Not on file   Highest education level: Not on file  Occupational History   Not on  file  Tobacco Use   Smoking status: Never   Smokeless tobacco: Never  Substance and Sexual Activity   Alcohol use: No    Alcohol/week: 0.0 standard drinks   Drug use: No   Sexual activity: Not Currently    Partners: Male    Comment: declined condoms  Other Topics Concern   Not on file  Social History Narrative   Not on file   Social Determinants of Health   Financial Resource Strain:  Not on file  Food Insecurity: Not on file  Transportation Needs: Not on file  Physical Activity: Not on file  Stress: Not on file  Social Connections: Not on file  Intimate Partner Violence: Not on file    Family History  Problem Relation Age of Onset   Diabetes Neg Hx    Heart disease Neg Hx    Cancer Neg Hx      Review of Systems  Constitutional: Negative.  Negative for chills and fever.  HENT: Negative.  Negative for congestion and sore throat.   Eyes:        Diminished vision from left eye  Respiratory: Negative.  Negative for cough and shortness of breath.   Cardiovascular:  Negative for chest pain and palpitations.  Gastrointestinal: Negative.  Negative for abdominal pain, diarrhea, nausea and vomiting.  Genitourinary: Negative.  Negative for dysuria and hematuria.  Skin: Negative.  Negative for rash.  Neurological: Negative.  Negative for dizziness and headaches.  All other systems reviewed and are negative.   Physical Exam Vitals reviewed.  Constitutional:      Appearance: Normal appearance.  HENT:     Head: Normocephalic.     Mouth/Throat:     Comments: Poor dentition Eyes:     Extraocular Movements: Extraocular movements intact.     Conjunctiva/sclera: Conjunctivae normal.     Pupils: Pupils are equal, round, and reactive to light.  Cardiovascular:     Rate and Rhythm: Normal rate and regular rhythm.     Pulses: Normal pulses.     Heart sounds: Normal heart sounds.  Pulmonary:     Effort: Pulmonary effort is normal.     Breath sounds: Normal breath sounds.  Abdominal:     General: There is no distension.     Palpations: Abdomen is soft.     Tenderness: There is no abdominal tenderness.  Musculoskeletal:        General: Normal range of motion.  Skin:    General: Skin is warm and dry.     Capillary Refill: Capillary refill takes less than 2 seconds.  Neurological:     General: No focal deficit present.     Mental Status: She is alert and oriented  to person, place, and time.  Psychiatric:        Mood and Affect: Mood normal.        Behavior: Behavior normal.     ASSESSMENT & PLAN: Essential hypertension, benign Uncontrolled hypertension.  Needs to start amlodipine 5 mg daily and Zestoretic 10-12.5 mg as before.  Dietary approaches to stop hypertension discussed with patient.  Follow-up in 4 weeks.  Diabetes type 2, uncontrolled Controlled diabetes with hemoglobin A1c at 11.7.  Needs to restart diabetes medications.  We will start metformin 500 mg twice a day and Jardiance 10 mg once a day.  Diet and nutrition discussed.  Follow-up in 4 weeks. Restart atorvastatin 20 mg daily. Cardiovascular risk associated with hypertension and diabetes discussed.  Needs to restart medications as prescribed. Diet  and nutrition discussed.  Past medical history discussed.  Review of available medical records and most recent blood work results reviewed.  Austina was seen today for new patient (initial visit).  Diagnoses and all orders for this visit:  Uncontrolled type 2 diabetes mellitus with hyperglycemia ([place]) -     POCT glycosylated hemoglobin (Hb A1C) -     Cancel: Comprehensive metabolic panel -     Cancel: CBC with Differential/Platelet -     Cancel: Lipid panel -     metFORMIN (GLUCOPHAGE) 500 MG tablet; Take 1 tablet (500 mg total) by mouth 2 (two) times daily with a meal. -     empagliflozin (JARDIANCE) 10 MG TABS tablet; Take 1 tablet (10 mg total) by mouth daily before breakfast.  Essential hypertension, benign -     lisinopril-hydrochlorothiazide (ZESTORETIC) 10-12.5 MG tablet; Take 1 tablet by mouth daily. -     amLODipine (NORVASC) 5 MG tablet; Take 1 tablet (5 mg total) by mouth daily.  Uncontrolled hypertension  Human immunodeficiency virus (HIV) disease ([place])  Hypertension associated with diabetes ([place]) -     CBC with Differential/Platelet -     Comprehensive metabolic panel -     Lipid panel  Dyslipidemia -      atorvastatin (LIPITOR) 20 MG tablet; Take 1 tablet (20 mg total) by mouth daily.  Noncompliance with medications  Patient Instructions  Hypertension, Adult High blood pressure (hypertension) is when the force of blood pumping through the arteries is too strong. The arteries are the blood vessels that carry blood from the heart throughout the body. Hypertension forces the heart to work harder to pump blood and may cause arteries to become narrow or stiff. Untreated or uncontrolled hypertension can cause a heart attack, heart failure, a stroke, kidney disease, and otherproblems. A blood pressure reading consists of a higher number over a lower number. Ideally, your blood pressure should be below 120/80. The first ("top") number is called the systolic pressure. It is a measure of the pressure in your arteries as your heart beats. The second ("bottom") number is called the diastolic pressure. It is a measure of the pressure in your arteries as theheart relaxes. What are the causes? The exact cause of this condition is not known. There are some conditions thatresult in or are related to high blood pressure. What increases the risk? Some risk factors for high blood pressure are under your control. The following factors may make you more likely to develop this condition: Smoking. Having type 2 diabetes mellitus, high cholesterol, or both. Not getting enough exercise or physical activity. Being overweight. Having too much fat, sugar, calories, or salt (sodium) in your diet. Drinking too much alcohol. Some risk factors for high blood pressure may be difficult or impossible to change. Some of these factors include: Having chronic kidney disease. Having a family history of high blood pressure. Age. Risk increases with age. Race. You may be at higher risk if you are African American. Gender. Men are at higher risk than women before age 76. After age 10, women are at higher risk than men. Having  obstructive sleep apnea. Stress. What are the signs or symptoms? High blood pressure may not cause symptoms. Very high blood pressure (hypertensive crisis) may cause: Headache. Anxiety. Shortness of breath. Nosebleed. Nausea and vomiting. Vision changes. Severe chest pain. Seizures. How is this diagnosed? This condition is diagnosed by measuring your blood pressure while you are seated, with your arm resting on a flat surface, your  legs uncrossed, and your feet flat on the floor. The cuff of the blood pressure monitor will be placed directly against the skin of your upper arm at the level of your heart. It should be measured at least twice using the same arm. Certain conditions cancause a difference in blood pressure between your right and left arms. Certain factors can cause blood pressure readings to be lower or higher than normal for a short period of time: When your blood pressure is higher when you are in a health care provider's office than when you are at home, this is called white coat hypertension. Most people with this condition do not need medicines. When your blood pressure is higher at home than when you are in a health care provider's office, this is called masked hypertension. Most people with this condition may need medicines to control blood pressure. If you have a high blood pressure reading during one visit or you have normal blood pressure with other risk factors, you may be asked to: Return on a different day to have your blood pressure checked again. Monitor your blood pressure at home for 1 week or longer. If you are diagnosed with hypertension, you may have other blood or imaging tests to help your health care provider understand your overall risk for otherconditions. How is this treated? This condition is treated by making healthy lifestyle changes, such as eating healthy foods, exercising more, and reducing your alcohol intake. Your health care provider may prescribe  medicine if lifestyle changes are not enough to get your blood pressure under control, and if: Your systolic blood pressure is above 130. Your diastolic blood pressure is above 80. Your personal target blood pressure may vary depending on your medicalconditions, your age, and other factors. Follow these instructions at home: Eating and drinking  Eat a diet that is high in fiber and potassium, and low in sodium, added sugar, and fat. An example eating plan is called the DASH (Dietary Approaches to Stop Hypertension) diet. To eat this way: Eat plenty of fresh fruits and vegetables. Try to fill one half of your plate at each meal with fruits and vegetables. Eat whole grains, such as whole-wheat pasta, brown rice, or whole-grain bread. Fill about one fourth of your plate with whole grains. Eat or drink low-fat dairy products, such as skim milk or low-fat yogurt. Avoid fatty cuts of meat, processed or cured meats, and poultry with skin. Fill about one fourth of your plate with lean proteins, such as fish, chicken without skin, beans, eggs, or tofu. Avoid pre-made and processed foods. These tend to be higher in sodium, added sugar, and fat. Reduce your daily sodium intake. Most people with hypertension should eat less than 1,500 mg of sodium a day. Do not drink alcohol if: Your health care provider tells you not to drink. You are pregnant, may be pregnant, or are planning to become pregnant. If you drink alcohol: Limit how much you use to: 0-1 drink a day for women. 0-2 drinks a day for men. Be aware of how much alcohol is in your drink. In the [place] one drink equals one 12 oz bottle of beer (355 mL), one 5 oz glass of wine (148 mL), or one 1 oz glass of hard liquor (44 mL).  Lifestyle  Work with your health care provider to maintain a healthy body weight or to lose weight. Ask what an ideal weight is for you. Get at least 30 minutes of exercise most days of the  week. Activities may include  walking, swimming, or biking. Include exercise to strengthen your muscles (resistance exercise), such as Pilates or lifting weights, as part of your weekly exercise routine. Try to do these types of exercises for 30 minutes at least 3 days a week. Do not use any products that contain nicotine or tobacco, such as cigarettes, e-cigarettes, and chewing tobacco. If you need help quitting, ask your health care provider. Monitor your blood pressure at home as told by your health care provider. Keep all follow-up visits as told by your health care provider. This is important.  Medicines Take over-the-counter and prescription medicines only as told by your health care provider. Follow directions carefully. Blood pressure medicines must be taken as prescribed. Do not skip doses of blood pressure medicine. Doing this puts you at risk for problems and can make the medicine less effective. Ask your health care provider about side effects or reactions to medicines that you should watch for. Contact a health care provider if you: Think you are having a reaction to a medicine you are taking. Have headaches that keep coming back (recurring). Feel dizzy. Have swelling in your ankles. Have trouble with your vision. Get help right away if you: Develop a severe headache or confusion. Have unusual weakness or numbness. Feel faint. Have severe pain in your chest or abdomen. Vomit repeatedly. Have trouble breathing. Summary Hypertension is when the force of blood pumping through your arteries is too strong. If this condition is not controlled, it may put you at risk for serious complications. Your personal target blood pressure may vary depending on your medical conditions, your age, and other factors. For most people, a normal blood pressure is less than 120/80. Hypertension is treated with lifestyle changes, medicines, or a combination of both. Lifestyle changes include losing weight, eating a healthy,  low-sodium diet, exercising more, and limiting alcohol. This information is not intended to replace advice given to you by your health care provider. Make sure you discuss any questions you have with your healthcare provider. Document Revised: 09/08/2017 Document Reviewed: 09/08/2017 Elsevier Patient Education  2022 [place], [place] [facility]

## 2020-08-27 NOTE — Assessment & Plan Note (Signed)
Uncontrolled hypertension.  Needs to start amlodipine 5 mg daily and Zestoretic 10-12.5 mg as before.  Dietary approaches to stop hypertension discussed with patient.  Follow-up in 4 weeks.

## 2020-08-27 NOTE — Patient Instructions (Signed)

## 2020-08-27 NOTE — Assessment & Plan Note (Addendum)
Controlled diabetes with hemoglobin A1c at 11.7.  Needs to restart diabetes medications.  We will start metformin 500 mg twice a day and Jardiance 10 mg once a day.  Diet and nutrition discussed.  Follow-up in 4 weeks. Restart atorvastatin 20 mg daily.

## 2020-08-29 ENCOUNTER — Other Ambulatory Visit (HOSPITAL_COMMUNITY): Payer: Self-pay

## 2020-10-10 ENCOUNTER — Other Ambulatory Visit: Payer: Self-pay

## 2020-10-10 ENCOUNTER — Encounter: Payer: Self-pay | Admitting: Emergency Medicine

## 2020-10-10 ENCOUNTER — Ambulatory Visit (INDEPENDENT_AMBULATORY_CARE_PROVIDER_SITE_OTHER): Payer: 59 | Admitting: Emergency Medicine

## 2020-10-10 VITALS — BP 136/80 | HR 78 | Temp 98.0°F | Ht 66.0 in | Wt 186.0 lb

## 2020-10-10 DIAGNOSIS — E1159 Type 2 diabetes mellitus with other circulatory complications: Secondary | ICD-10-CM | POA: Insufficient documentation

## 2020-10-10 DIAGNOSIS — I152 Hypertension secondary to endocrine disorders: Secondary | ICD-10-CM | POA: Diagnosis not present

## 2020-10-10 DIAGNOSIS — H547 Unspecified visual loss: Secondary | ICD-10-CM | POA: Insufficient documentation

## 2020-10-10 DIAGNOSIS — E1165 Type 2 diabetes mellitus with hyperglycemia: Secondary | ICD-10-CM

## 2020-10-10 DIAGNOSIS — Z23 Encounter for immunization: Secondary | ICD-10-CM | POA: Diagnosis not present

## 2020-10-10 LAB — POCT GLYCOSYLATED HEMOGLOBIN (HGB A1C): Hemoglobin A1C: 10 % — AB (ref 4.0–5.6)

## 2020-10-10 LAB — GLUCOSE, POCT (MANUAL RESULT ENTRY): POC Glucose: 172 mg/dl — AB (ref 70–99)

## 2020-10-10 NOTE — Progress Notes (Signed)
Katrina Parker 51 y.o.   Chief Complaint  Patient presents with   Hypertension    BP and diabetic follow up  Last office visit assessment and plan as follows: ASSESSMENT & PLAN: Essential hypertension, benign Uncontrolled hypertension.  Needs to start amlodipine 5 mg daily and Zestoretic 10-12.5 mg as before.  Dietary approaches to stop hypertension discussed with patient.  Follow-up in 4 weeks.   Diabetes type 2, uncontrolled Controlled diabetes with hemoglobin A1c at 11.7.  Needs to restart diabetes medications.  We will start metformin 500 mg twice a day and Jardiance 10 mg once a day.  Diet and nutrition discussed.  Follow-up in 4 weeks. Restart atorvastatin 20 mg daily. Cardiovascular risk associated with hypertension and diabetes discussed.  Needs to restart medications as prescribed. Diet and nutrition discussed.  Past medical history discussed.  Review of available medical records and most recent blood work results reviewed.  HISTORY OF PRESENT ILLNESS: This is a 50 y.o. female with hypertension diabetes here for 4-week follow-up. Started on medications and sugars are better. BP Readings from Last 3 Encounters:  08/27/20 (!) 162/88  01/22/16 (!) 152/96  06/26/15 (!) 156/102   Complaining of bilateral progressive visual loss that started several weeks ago but has been rapidly progressing the last 2 weeks.  States room looks dark.  Unable to drive or go to work.  Needs ophthalmology evaluation. No other complaints or medical concerns today.  Hypertension Pertinent negatives include no chest pain, headaches, palpitations or shortness of breath.    Prior to Admission medications   Medication Sig Start Date End Date Taking? Authorizing Provider  amLODipine (NORVASC) 5 MG tablet Take 1 tablet (5 mg total) by mouth daily. 08/27/20  Yes Jessee Newnam, Ines Bloomer, MD  atorvastatin (LIPITOR) 20 MG tablet Take 1 tablet (20 mg total) by mouth daily. 08/27/20  Yes Callaghan Laverdure, Ines Bloomer, MD   elvitegravir-cobicistat-emtricitabine-tenofovir (GENVOYA) 150-150-200-10 MG TABS tablet Take 1 tablet by mouth daily with breakfast. 03/13/15  Yes Campbell Riches, MD  empagliflozin (JARDIANCE) 10 MG TABS tablet Take 1 tablet (10 mg total) by mouth daily before breakfast. 08/27/20 11/27/20 Yes Anay Walter, Ines Bloomer, MD  fluticasone [facility]) 50 MCG/ACT nasal spray Place into the nose. 10/20/17  Yes [provider]  glucose monitoring kit (FREESTYLE) monitoring kit 1 each by Does not apply route 2 (two) times daily at 8 am and 10 pm. 09/14/13  Yes Dorena Dew, FNP  lisinopril-hydrochlorothiazide (ZESTORETIC) 10-12.5 MG tablet Take 1 tablet by mouth daily. 08/27/20  Yes Horald Pollen, MD  metFORMIN (GLUCOPHAGE) 500 MG tablet Take 1 tablet (500 mg total) by mouth 2 (two) times daily with a meal. 08/27/20  Yes [place], Ines Bloomer, MD  ONE TOUCH ULTRA TEST test strip Reported on 06/26/2015 11/03/13  Yes [provider]  fluconazole (DIFLUCAN) 100 MG tablet Take 1 tablet (100 mg total) by mouth daily. Patient not taking: Reported on 10/10/2020 01/22/16   Campbell Riches, MD    Allergies  Allergen Reactions   Ibuprofen Rash    Patient Active Problem List   Diagnosis Date Noted   Vision changes 10/20/2017   Seasonal allergic rhinitis due to pollen 10/20/2017   Conjunctivitis 10/20/2017   Diabetes type 2, uncontrolled ([place]) 09/13/2013   Obesity 09/13/2013   History of uterine fibroid 09/13/2013   Essential hypertension, benign 02/22/2012   Chronic right hip pain 02/22/2012   Post herpetic neuralgia 05/20/2010   ABDOMINAL WALL HERNIA 12/24/2006   Human immunodeficiency virus (HIV)  disease ([place]) 09/29/2006    Past Medical History:  Diagnosis Date   Diabetes mellitus without complication ([place])    HIV (human immunodeficiency virus infection) ([place])     Past Surgical History:  Procedure Laterality Date   OTHER SURGICAL HISTORY     2 abdominal surgeries (1 in  [place], 1 in [place]; unsure kind of procedure, states there might have been a "tumor")    Social History   Socioeconomic History   Marital status: Widowed    Spouse name: Not on file   Number of children: Not on file   Years of education: Not on file   Highest education level: Not on file  Occupational History   Not on file  Tobacco Use   Smoking status: Never   Smokeless tobacco: Never  Substance and Sexual Activity   Alcohol use: No    Alcohol/week: 0.0 standard drinks   Drug use: No   Sexual activity: Not Currently    Partners: Male    Comment: declined condoms  Other Topics Concern   Not on file  Social History Narrative   Not on file   Social Determinants of Health   Financial Resource Strain: Not on file  Food Insecurity: Not on file  Transportation Needs: Not on file  Physical Activity: Not on file  Stress: Not on file  Social Connections: Not on file  Intimate Partner Violence: Not on file    Family History  Problem Relation Age of Onset   Diabetes Neg Hx    Heart disease Neg Hx    Cancer Neg Hx      Review of Systems  Constitutional: Negative.  Negative for fever.  HENT: Negative.  Negative for congestion and sore throat.   Respiratory: Negative.  Negative for cough and shortness of breath.   Cardiovascular: Negative.  Negative for chest pain and palpitations.  Gastrointestinal: Negative.  Negative for abdominal pain, diarrhea, nausea and vomiting.  Genitourinary: Negative.  Negative for dysuria and hematuria.  Skin: Negative.  Negative for rash.  Neurological: Negative.  Negative for dizziness and headaches.  All other systems reviewed and are negative.  Vitals:   10/10/20 0851  BP: 136/80  Pulse: 78  Temp: 98 F (36.7 C)  SpO2: 97%    Wt Readings from Last 3 Encounters:  10/10/20 186 lb (84.4 kg)  08/27/20 188 lb (85.3 kg)  01/22/16 216 lb 12.8 oz (98.3 kg)    Physical Exam Vitals reviewed.  Constitutional:      Appearance: Normal  appearance.  HENT:     Head: Normocephalic.  Eyes:     Extraocular Movements: Extraocular movements intact.     Conjunctiva/sclera: Conjunctivae normal.     Pupils: Pupils are equal, round, and reactive to light.  Cardiovascular:     Rate and Rhythm: Normal rate and regular rhythm.     Heart sounds: Normal heart sounds.  Pulmonary:     Effort: Pulmonary effort is normal.     Breath sounds: Normal breath sounds.  Musculoskeletal:     Cervical back: No tenderness.  Lymphadenopathy:     Cervical: No cervical adenopathy.  Skin:    General: Skin is warm and dry.     Capillary Refill: Capillary refill takes less than 2 seconds.  Neurological:     General: No focal deficit present.     Mental Status: She is alert and oriented to person, place, and time.  Psychiatric:        Mood and Affect: Mood normal.  Behavior: Behavior normal.   Results for orders placed or performed in visit on 10/10/20 (from the past 24 hour(s))  POCT glycosylated hemoglobin (Hb A1C)     Status: Abnormal   Collection Time: 10/10/20  8:58 AM  Result Value Ref Range   Hemoglobin A1C 10.0 (A) 4.0 - 5.6 %   HbA1c POC (<> result, manual entry)     HbA1c, POC (prediabetic range)     HbA1c, POC (controlled diabetic range)    POCT glucose (manual entry)     Status: Abnormal   Collection Time: 10/10/20  9:03 AM  Result Value Ref Range   POC Glucose 172 (A) 70 - 99 mg/dl     ASSESSMENT & PLAN: Problem List Items Addressed This Visit       Cardiovascular and Mediastinum   Hypertension associated with diabetes ([place])    Well-controlled hypertension.  Continue amlodipine 5 mg daily and Zestoretic 10-12.5 mg daily. Uncontrolled diabetes with hemoglobin A1c of 10. Results for orders placed or performed in visit on 10/10/20 (from the past 24 hour(s))  POCT glycosylated hemoglobin (Hb A1C)     Status: Abnormal   Collection Time: 10/10/20  8:58 AM  Result Value Ref Range   Hemoglobin A1C 10.0 (A) 4.0 - 5.6 %    HbA1c POC (<> result, manual entry)     HbA1c, POC (prediabetic range)     HbA1c, POC (controlled diabetic range)    POCT glucose (manual entry)     Status: Abnormal   Collection Time: 10/10/20  9:03 AM  Result Value Ref Range   POC Glucose 172 (A) 70 - 99 mg/dl  Blood glucose much improved. Diet and nutrition discussed. Follow-up in 4 weeks.         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  Needs ophthalmology evaluation.  Referral placed today.      Relevant Orders   Ambulatory referral to Ophthalmology   Other Visit Diagnoses     Need for influenza vaccination       Relevant Orders   Flu Vaccine QUAD 58moIM (Fluarix, Fluzone & Alfiuria Quad PF) (Completed)      Patient Instructions  Visual Disturbances A visual disturbance is any problem that interferes with your normal vision. This can affect one eye or both eyes. Some types of visual disturbances come and go without treatment and do not cause a permanent problem. Other visual disturbances may be a sign of a medical emergency. Visual disturbances include: Blurred vision. Being unable to see certain colors. Being sensitive to light. Double vision. Partial vision loss (visual field deficit). Being unaware of objects on one side of the body (visual spatial inattention). Rhythmic eye movements that you cannot control (nystagmus). Short-term or long-term blindness. Seeing: Floating spots or lines (floaters). Flashing or shimmering lights. Zigzagging lines or stars. The floor as tilted (visual midline shift). Things that are not really there (hallucinations). Causes of visual disturbances include: Eye infection. The thin membrane at the back of the  eye separating from the eyeball (retinal detachment). High blood pressure. Migraine. Glaucoma. Ischemic stroke. Cerebral aneurysm. It is important to get your eyes checked by a health care provider or eye specialist (ophthalmologist or optometrist) as soon as possible  to determine the cause of your visual disturbance. Follow these instructions at home: Take over-the-counter and prescription medicines only as told by your health care provider. Do not use any products that contain nicotine or tobacco, such as cigarettes and e-cigarettes. If you need help quitting, ask your health care provider. To lower your risk of the problems that can lead to visual disturbances: Eat a balanced diet that includes fruits and vegetables, whole grains, lean meat, and low-fat dairy. Maintain a healthy weight. Work with your health care provider to lose weight if you need to. Exercise regularly. Ask your health care provider what activities are safe for you. Do not drive if you have trouble seeing. Ask your health care provider for guidance about when it is and is not safe for you to drive. Keep all follow-up visits as told by your health care provider. This is important. Contact a health care provider if: Your visual disturbance changes or becomes worse. Get help right away if you:  Have new visual disturbances. Suddenly see flashing lights or floaters. Suddenly have a dark area in your field of vision, especially in the lower part. This can lead to a loss of central vision. Lose vision in one or both eyes. Have any symptoms of a stroke. "BE FAST" is an easy way to remember the main warning signs of a stroke: B - Balance. Signs are dizziness, sudden trouble walking, or loss of balance. E - Eyes. Signs are trouble seeing or a sudden change in vision. F - Face. Signs are sudden weakness or numbness of the face, or the face or eyelid drooping on one side. A - Arms. Signs are weakness or numbness in an arm. This  happens suddenly and usually on one side of the body. S - Speech. Signs are sudden trouble speaking, slurred speech, or trouble understanding what people say. T - Time. Time to call emergency services. Write down what time symptoms started. Have other signs of a stroke, such as: A sudden, severe headache with no known cause. Nausea or vomiting. Seizure. These symptoms may represent a serious problem that is an emergency. Do not wait to see if the symptoms will go away. Get medical help right away. Call your local emergency services (911 in the [place] Do not drive yourself to the hospital. Summary A visual disturbance is any problem that interferes with your normal vision. Some visual disturbances may be a sign of a medical emergency. It is important to get your eyes checked by a health care provider or eye specialist to determine what kind of visual disturbance you have. This information is not intended to replace advice given to you by your health care provider. Make sure you discuss any questions you have with your health care provider. Document Revised: 04/19/2018 Document Reviewed: 01/19/2017 Elsevier Patient Education  2022 [place], [place] [facility]

## 2020-10-10 NOTE — Assessment & Plan Note (Signed)
Well-controlled hypertension.  Continue amlodipine 5 mg daily and Zestoretic 10-12.5 mg daily. Uncontrolled diabetes with hemoglobin A1c of 10. Results for orders placed or performed in visit on 10/10/20 (from the past 24 hour(s))  POCT glycosylated hemoglobin (Hb A1C)     Status: Abnormal   Collection Time: 10/10/20  8:58 AM  Result Value Ref Range   Hemoglobin A1C 10.0 (A) 4.0 - 5.6 %   HbA1c POC (<> result, manual entry)     HbA1c, POC (prediabetic range)     HbA1c, POC (controlled diabetic range)    POCT glucose (manual entry)     Status: Abnormal   Collection Time: 10/10/20  9:03 AM  Result Value Ref Range   POC Glucose 172 (A) 70 - 99 mg/dl  Blood glucose much improved. Diet and nutrition discussed. Follow-up in 4 weeks.

## 2020-10-10 NOTE — Patient Instructions (Signed)
Visual Disturbances A visual disturbance is any problem that interferes with your normal vision. This can affect one eye or both eyes. Some types of visual disturbances come and go without treatment and do not cause a permanent problem. Other visual disturbances may be a sign of a medical emergency. Visual disturbances include: Blurred vision. Being unable to see certain colors. Being sensitive to light. Double vision. Partial vision loss (visual field deficit). Being unaware of objects on one side of the body (visual spatial inattention). Rhythmic eye movements that you cannot control (nystagmus). Short-term or long-term blindness. Seeing: Floating spots or lines (floaters). Flashing or shimmering lights. Zigzagging lines or stars. The floor as tilted (visual midline shift). Things that are not really there (hallucinations). Causes of visual disturbances include: Eye infection. The thin membrane at the back of the eye separating from the eyeball (retinal detachment). High blood pressure. Migraine. Glaucoma. Ischemic stroke. Cerebral aneurysm. It is important to get your eyes checked by a health care provider or eye specialist (ophthalmologist or optometrist) as soon as possible to determine the cause of your visual disturbance. Follow these instructions at home: Take over-the-counter and prescription medicines only as told by your health care provider. Do not use any products that contain nicotine or tobacco, such as cigarettes and e-cigarettes. If you need help quitting, ask your health care provider. To lower your risk of the problems that can lead to visual disturbances: Eat a balanced diet that includes fruits and vegetables, whole grains, lean meat, and low-fat dairy. Maintain a healthy weight. Work with your health care provider to lose weight if you need to. Exercise regularly. Ask your health care provider what activities are safe for you. Do not drive if you have trouble  seeing. Ask your health care provider for guidance about when it is and is not safe for you to drive. Keep all follow-up visits as told by your health care provider. This is important. Contact a health care provider if: Your visual disturbance changes or becomes worse. Get help right away if you:  Have new visual disturbances. Suddenly see flashing lights or floaters. Suddenly have a dark area in your field of vision, especially in the lower part. This can lead to a loss of central vision. Lose vision in one or both eyes. Have any symptoms of a stroke. "BE FAST" is an easy way to remember the main warning signs of a stroke: B - Balance. Signs are dizziness, sudden trouble walking, or loss of balance. E - Eyes. Signs are trouble seeing or a sudden change in vision. F - Face. Signs are sudden weakness or numbness of the face, or the face or eyelid drooping on one side. A - Arms. Signs are weakness or numbness in an arm. This happens suddenly and usually on one side of the body. S - Speech. Signs are sudden trouble speaking, slurred speech, or trouble understanding what people say. T - Time. Time to call emergency services. Write down what time symptoms started. Have other signs of a stroke, such as: A sudden, severe headache with no known cause. Nausea or vomiting. Seizure. These symptoms may represent a serious problem that is an emergency. Do not wait to see if the symptoms will go away. Get medical help right away. Call your local emergency services (911 in the [place] Do not drive yourself to the hospital. Summary A visual disturbance is any problem that interferes with your normal vision. Some visual disturbances may be a sign of a medical emergency.  It is important to get your eyes checked by a health care provider or eye specialist to determine what kind of visual disturbance you have. This information is not intended to replace advice given to you by your health care provider. Make  sure you discuss any questions you have with your health care provider. Document Revised: 04/19/2018 Document Reviewed: 01/19/2017 Elsevier Patient Education  2022 [employer].

## 2020-10-10 NOTE — Assessment & Plan Note (Signed)
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  Needs ophthalmology evaluation.  Referral placed today.

## 2020-10-14 ENCOUNTER — Other Ambulatory Visit (HOSPITAL_COMMUNITY): Payer: Self-pay

## 2020-11-07 ENCOUNTER — Other Ambulatory Visit (HOSPITAL_COMMUNITY): Payer: Self-pay

## 2020-11-07 ENCOUNTER — Encounter: Payer: Self-pay | Admitting: Emergency Medicine

## 2020-11-07 ENCOUNTER — Ambulatory Visit (INDEPENDENT_AMBULATORY_CARE_PROVIDER_SITE_OTHER): Payer: 59 | Admitting: Emergency Medicine

## 2020-11-07 ENCOUNTER — Other Ambulatory Visit: Payer: Self-pay

## 2020-11-07 VITALS — BP 132/80 | HR 80 | Temp 98.0°F | Wt 185.0 lb

## 2020-11-07 DIAGNOSIS — K05219 Aggressive periodontitis, localized, unspecified severity: Secondary | ICD-10-CM | POA: Insufficient documentation

## 2020-11-07 DIAGNOSIS — I152 Hypertension secondary to endocrine disorders: Secondary | ICD-10-CM

## 2020-11-07 DIAGNOSIS — E1165 Type 2 diabetes mellitus with hyperglycemia: Secondary | ICD-10-CM

## 2020-11-07 DIAGNOSIS — B20 Human immunodeficiency virus [HIV] disease: Secondary | ICD-10-CM

## 2020-11-07 DIAGNOSIS — E1159 Type 2 diabetes mellitus with other circulatory complications: Secondary | ICD-10-CM | POA: Diagnosis not present

## 2020-11-07 DIAGNOSIS — H547 Unspecified visual loss: Secondary | ICD-10-CM | POA: Diagnosis not present

## 2020-11-07 DIAGNOSIS — E785 Hyperlipidemia, unspecified: Secondary | ICD-10-CM

## 2020-11-07 LAB — GLUCOSE, POCT (MANUAL RESULT ENTRY): POC Glucose: 230 mg/dl — AB (ref 70–99)

## 2020-11-07 MED ORDER — TRULICITY 0.75 MG/0.5ML ~~LOC~~ SOAJ
0.7500 mg | SUBCUTANEOUS | 3 refills | Status: DC
Start: 2020-11-07 — End: 2021-11-29
  Filled 2020-11-07: qty 6, 84d supply, fill #0
  Filled 2021-01-29: qty 6, 84d supply, fill #1
  Filled 2021-05-15: qty 6, 84d supply, fill #2
  Filled 2021-08-15: qty 6, 84d supply, fill #3

## 2020-11-07 MED ORDER — AMOXICILLIN-POT CLAVULANATE 875-125 MG PO TABS
1.0000 | ORAL_TABLET | Freq: Two times a day (BID) | ORAL | 0 refills | Status: AC
  Filled 2020-11-07: qty 14, 7d supply, fill #0

## 2020-11-07 NOTE — Progress Notes (Signed)
Katrina Parker 50 y.o.   Chief Complaint  Patient presents with   Diabetes    F/U. Pt states that she is dizzy and no appetite. Pt needs referral to dentist for swollen gums.    HISTORY OF PRESENT ILLNESS: This is a 50 y.o. female with history of diabetes here for follow-up. Still feeling dizzy and occasionally has no appetite. Also complaining of pain to left lower molar area with periodontal swelling of gums. Also complaining of chronic visual problems and visual loss. Ophthalmology referral was placed last September but nothing scheduled yet Patient has history of uncontrolled diabetes for a long time. Accompanied by son today. Lab Results  Component Value Date   HGBA1C 10.0 (A) 10/10/2020     Diabetes Hypoglycemia symptoms include dizziness. Pertinent negatives for hypoglycemia include no headaches or seizures. Associated symptoms include blurred vision. Pertinent negatives for diabetes include no chest pain.    Prior to Admission medications   Medication Sig Start Date End Date Taking? Authorizing Provider  amLODipine (NORVASC) 5 MG tablet Take 1 tablet (5 mg total) by mouth daily. 08/27/20   Horald Pollen, MD  atorvastatin (LIPITOR) 20 MG tablet Take 1 tablet (20 mg total) by mouth daily. 08/27/20   Horald Pollen, MD  elvitegravir-cobicistat-emtricitabine-tenofovir (GENVOYA) 150-150-200-10 MG TABS tablet Take 1 tablet by mouth daily with breakfast. 03/13/15   Campbell Riches, MD  empagliflozin (JARDIANCE) 10 MG TABS tablet Take 1 tablet (10 mg total) by mouth daily before breakfast. 08/27/20 11/27/20  Horald Pollen, MD  fluconazole (DIFLUCAN) 100 MG tablet Take 1 tablet (100 mg total) by mouth daily. Patient not taking: Reported on 10/10/2020 01/22/16   Campbell Riches, MD  fluticasone [facility]) 50 MCG/ACT nasal spray Place into the nose. 10/20/17   [provider]  glucose monitoring kit (FREESTYLE) monitoring kit 1 each by Does not apply route  2 (two) times daily at 8 am and 10 pm. 09/14/13   Dorena Dew, FNP  lisinopril-hydrochlorothiazide (ZESTORETIC) 10-12.5 MG tablet Take 1 tablet by mouth daily. 08/27/20   Horald Pollen, MD  metFORMIN (GLUCOPHAGE) 500 MG tablet Take 1 tablet (500 mg total) by mouth 2 (two) times daily with a meal. 08/27/20   Horald Pollen, MD  ONE [facility] ULTRA TEST test strip Reported on 06/26/2015 11/03/13   [provider]    Allergies  Allergen Reactions   Ibuprofen Rash    Patient Active Problem List   Diagnosis Date Noted   Hypertension associated with diabetes ([place]) 10/10/2020   Visual problems 10/10/2020   Vision changes 10/20/2017   Seasonal allergic rhinitis due to pollen 10/20/2017   Diabetes type 2, uncontrolled 09/13/2013   Obesity 09/13/2013   History of uterine fibroid 09/13/2013   Essential hypertension, benign 02/22/2012   Chronic right hip pain 02/22/2012   Post herpetic neuralgia 05/20/2010   ABDOMINAL WALL HERNIA 12/24/2006   Human immunodeficiency virus (HIV) disease ([place]) 09/29/2006    Past Medical History:  Diagnosis Date   Diabetes mellitus without complication ([place])    HIV (human immunodeficiency virus infection) ([place])     Past Surgical History:  Procedure Laterality Date   OTHER SURGICAL HISTORY     2 abdominal surgeries (1 in [place], 1 in [place]; unsure kind of procedure, states there might have been a "tumor")    Social History   Socioeconomic History   Marital status: Widowed    Spouse name: Not on file   Number of children: Not on  file   Years of education: Not on file   Highest education level: Not on file  Occupational History   Not on file  Tobacco Use   Smoking status: Never   Smokeless tobacco: Never  Substance and Sexual Activity   Alcohol use: No    Alcohol/week: 0.0 standard drinks   Drug use: No   Sexual activity: Not Currently    Partners: Male    Comment: declined condoms  Other Topics Concern   Not on file   Social History Narrative   Not on file   Social Determinants of Health   Financial Resource Strain: Not on file  Food Insecurity: Not on file  Transportation Needs: Not on file  Physical Activity: Not on file  Stress: Not on file  Social Connections: Not on file  Intimate Partner Violence: Not on file    Family History  Problem Relation Age of Onset   Diabetes Neg Hx    Heart disease Neg Hx    Cancer Neg Hx      Review of Systems  Constitutional: Negative.  Negative for chills and fever.  HENT: Negative.  Negative for congestion and sore throat.   Eyes:  Positive for blurred vision. Negative for pain.  Respiratory: Negative.  Negative for cough and shortness of breath.   Cardiovascular: Negative.  Negative for chest pain and palpitations.  Gastrointestinal:  Negative for abdominal pain, diarrhea, nausea and vomiting.  Genitourinary: Negative.  Negative for dysuria and hematuria.  Skin: Negative.  Negative for rash.  Neurological:  Positive for dizziness. Negative for speech change, focal weakness, seizures, loss of consciousness and headaches.  All other systems reviewed and are negative.  Vitals:   11/07/20 1213  BP: 132/80  Pulse: 80  Temp: 98 F (36.7 C)    Physical Exam Vitals reviewed.  Constitutional:      Appearance: Normal appearance.  HENT:     Head: Normocephalic.     Mouth/Throat:     Comments: Left submandibular swelling Eyes:     Extraocular Movements: Extraocular movements intact.     Conjunctiva/sclera: Conjunctivae normal.     Pupils: Pupils are equal, round, and reactive to light.     Comments: Bilateral cataracts  Cardiovascular:     Rate and Rhythm: Normal rate and regular rhythm.     Pulses: Normal pulses.     Heart sounds: Normal heart sounds.  Pulmonary:     Effort: Pulmonary effort is normal.     Breath sounds: Normal breath sounds.  Abdominal:     Palpations: Abdomen is soft.     Tenderness: There is no abdominal tenderness.   Musculoskeletal:     Cervical back: No tenderness.  Lymphadenopathy:     Cervical: No cervical adenopathy.  Skin:    General: Skin is warm and dry.     Capillary Refill: Capillary refill takes less than 2 seconds.  Neurological:     General: No focal deficit present.     Mental Status: She is alert and oriented to person, place, and time.  Psychiatric:        Mood and Affect: Mood normal.        Behavior: Behavior normal.    Results for orders placed or performed in visit on 11/07/20 (from the past 24 hour(s))  POCT glucose (manual entry)     Status: Abnormal   Collection Time: 11/07/20 10:21 AM  Result Value Ref Range   POC Glucose 230 (A) 70 - 99 mg/dl  ASSESSMENT & PLAN: A total of 50 minutes was spent with the patient and counseling/coordination of care regarding preparing for this visit, review of most recent office visit notes, review of most recent blood work results including today's glucose level, review of all medications and changes made including addition of weekly Trulicity and usage of injectable by using demonstration pen, education on nutrition, cardiovascular risks associated with uncontrolled diabetes, differential diagnosis of chronic visual changes including possibility of diabetic retinopathy and need to follow-up with ophthalmologist, prognosis, documentation and need for follow-up in 3 months  Problem List Items Addressed This Visit       Cardiovascular and Mediastinum   Hypertension associated with diabetes ([place])    Well-controlled hypertension. BP Readings from Last 3 Encounters:  10/10/20 136/80  08/27/20 (!) 162/88  01/22/16 (!) 152/96  Continue amlodipine 5 mg daily and Zestoretic 10-12.5 mg daily. Uncontrolled diabetes.  Glucose today 230. Continue metformin and Jardiance. We will add weekly Trulicity 5.05 mg.  Demonstration pen used. Follow-up in 3 months.       Relevant Medications   Dulaglutide (TRULICITY) 6.97 XY/8.0XK SOPN      Digestive   Periodontal abscess   Relevant Medications   amoxicillin-clavulanate (AUGMENTIN) 875-125 MG tablet   Other Relevant Orders   Ambulatory referral to Dentistry     Endocrine   Diabetes type 2, uncontrolled   Relevant Medications   Dulaglutide (TRULICITY) 5.53 ZS/8.2LM SOPN   Other Relevant Orders   POCT glucose (manual entry) (Completed)     Other   Human immunodeficiency virus (HIV) disease (H[facility])   Visual problems - Primary    Most likely diabetic retinopathy.  Ophthalmology referral placed.      Other Visit Diagnoses     Dyslipidemia          Patient Instructions  Will contact Dr. Zenia Resides office and arrange for ophthalmology evaluation. Continue medications and start Trulicity weekly as demonstrated. Follow-up in 3 months. Diabetes Mellitus and Nutrition, Adult When you have diabetes, or diabetes mellitus, it is very important to have healthy eating habits because your blood sugar (glucose) levels are greatly affected by what you eat and drink. Eating healthy foods in the right amounts, at about the same times every day, can help you: Control your blood glucose. Lower your risk of heart disease. Improve your blood pressure. Reach or maintain a healthy weight. What can affect my meal plan? Every person with diabetes is different, and each person has different needs for a meal plan. Your health care provider may recommend that you work with a dietitian to make a meal plan that is best for you. Your meal plan may vary depending on factors such as: The calories you need. The medicines you take. Your weight. Your blood glucose, blood pressure, and cholesterol levels. Your activity level. Other health conditions you have, such as heart or kidney disease. How do carbohydrates affect me? Carbohydrates, also called carbs, affect your blood glucose level more than any other type of food. Eating carbs naturally raises the amount of glucose in your blood. Carb counting  is a method for keeping track of how many carbs you eat. Counting carbs is important to keep your blood glucose at a healthy level, especially if you use insulin or take certain oral diabetes medicines. It is important to know how many carbs you can safely have in each meal. This is different for every person. Your dietitian can help you calculate how many carbs you should have at each meal  and for each snack. How does alcohol affect me? Alcohol can cause a sudden decrease in blood glucose (hypoglycemia), especially if you use insulin or take certain oral diabetes medicines. Hypoglycemia can be a life-threatening condition. Symptoms of hypoglycemia, such as sleepiness, dizziness, and confusion, are similar to symptoms of having too much alcohol. Do not drink alcohol if: Your health care provider tells you not to drink. You are pregnant, may be pregnant, or are planning to become pregnant. If you drink alcohol: Do not drink on an empty stomach. Limit how much you use to: 0-1 drink a day for women. 0-2 drinks a day for men. Be aware of how much alcohol is in your drink. In the [place] one drink equals one 12 oz bottle of beer (355 mL), one 5 oz glass of wine (148 mL), or one 1 oz glass of hard liquor (44 mL). Keep yourself hydrated with water, diet soda, or unsweetened iced tea. Keep in mind that regular soda, juice, and other mixers may contain a lot of sugar and must be counted as carbs. What are tips for following this plan? Reading food labels Start by checking the serving size on the "Nutrition Facts" label of packaged foods and drinks. The amount of calories, carbs, fats, and other nutrients listed on the label is based on one serving of the item. Many items contain more than one serving per package. Check the total grams (g) of carbs in one serving. You can calculate the number of servings of carbs in one serving by dividing the total carbs by 15. For example, if a food has 30 g of total carbs  per serving, it would be equal to 2 servings of carbs. Check the number of grams (g) of saturated fats and trans fats in one serving. Choose foods that have a low amount or none of these fats. Check the number of milligrams (mg) of salt (sodium) in one serving. Most people should limit total sodium intake to less than 2,300 mg per day. Always check the nutrition information of foods labeled as "low-fat" or "nonfat." These foods may be higher in added sugar or refined carbs and should be avoided. Talk to your dietitian to identify your daily goals for nutrients listed on the label. Shopping Avoid buying canned, pre-made, or processed foods. These foods tend to be high in fat, sodium, and added sugar. Shop around the outside edge of the grocery store. This is where you will most often find fresh fruits and vegetables, bulk grains, fresh meats, and fresh dairy. Cooking Use low-heat cooking methods, such as baking, instead of high-heat cooking methods like deep frying. Cook using healthy oils, such as olive, canola, or sunflower oil. Avoid cooking with butter, cream, or high-fat meats. Meal planning Eat meals and snacks regularly, preferably at the same times every day. Avoid going long periods of time without eating. Eat foods that are high in fiber, such as fresh fruits, vegetables, beans, and whole grains. Talk with your dietitian about how many servings of carbs you can eat at each meal. Eat 4-6 oz (112-168 g) of lean protein each day, such as lean meat, chicken, fish, eggs, or tofu. One ounce (oz) of lean protein is equal to: 1 oz (28 g) of meat, chicken, or fish. 1 egg.  cup (62 g) of tofu. Eat some foods each day that contain healthy fats, such as avocado, nuts, seeds, and fish. What foods should I eat? Fruits Berries. Apples. Oranges. Peaches. Apricots. Plums. Grapes. Mango. Papaya.  Pomegranate. Kiwi. Cherries. Vegetables Lettuce. Spinach. Leafy greens, including kale, chard, collard  greens, and mustard greens. Beets. Cauliflower. Cabbage. Broccoli. Carrots. Green beans. Tomatoes. Peppers. Onions. Cucumbers. Brussels sprouts. Grains Whole grains, such as whole-wheat or whole-grain bread, crackers, tortillas, cereal, and pasta. Unsweetened oatmeal. Quinoa. Brown or wild rice. Meats and other proteins Seafood. Poultry without skin. Lean cuts of poultry and beef. Tofu. Nuts. Seeds. Dairy Low-fat or fat-free dairy products such as milk, yogurt, and cheese. The items listed above may not be a complete list of foods and beverages you can eat. Contact a dietitian for more information. What foods should I avoid? Fruits Fruits canned with syrup. Vegetables Canned vegetables. Frozen vegetables with butter or cream sauce. Grains Refined white flour and flour products such as bread, pasta, snack foods, and cereals. Avoid all processed foods. Meats and other proteins Fatty cuts of meat. Poultry with skin. Breaded or fried meats. Processed meat. Avoid saturated fats. Dairy Full-fat yogurt, cheese, or milk. Beverages Sweetened drinks, such as soda or iced tea. The items listed above may not be a complete list of foods and beverages you should avoid. Contact a dietitian for more information. Questions to ask a health care provider Do I need to meet with a diabetes educator? Do I need to meet with a dietitian? What number can I call if I have questions? When are the best times to check my blood glucose? Where to find more information: American Diabetes Association: diabetes.org Academy of Nutrition and Dietetics: www.eatright.[employer] of Diabetes and Digestive and Kidney Diseases: [employer] Association of Diabetes Care and Education Specialists: www.diabeteseducator.org Summary It is important to have healthy eating habits because your blood sugar (glucose) levels are greatly affected by what you eat and drink. A healthy meal plan will help you control your  blood glucose and maintain a healthy lifestyle. Your health care provider may recommend that you work with a dietitian to make a meal plan that is best for you. Keep in mind that carbohydrates (carbs) and alcohol have immediate effects on your blood glucose levels. It is important to count carbs and to use alcohol carefully. This information is not intended to replace advice given to you by your health care provider. Make sure you discuss any questions you have with your health care provider. Document Revised: 12/06/2018 Document Reviewed: 12/06/2018 Elsevier Patient Education  2021 [place], [place] [employer]

## 2020-11-07 NOTE — Patient Instructions (Signed)
Will contact Dr. Zenia Resides office and arrange for ophthalmology evaluation. Continue medications and start Trulicity weekly as demonstrated. Follow-up in 3 months. Diabetes Mellitus and Nutrition, Adult When you have diabetes, or diabetes mellitus, it is very important to have healthy eating habits because your blood sugar (glucose) levels are greatly affected by what you eat and drink. Eating healthy foods in the right amounts, at about the same times every day, can help you: Control your blood glucose. Lower your risk of heart disease. Improve your blood pressure. Reach or maintain a healthy weight. What can affect my meal plan? Every person with diabetes is different, and each person has different needs for a meal plan. Your health care provider may recommend that you work with a dietitian to make a meal plan that is best for you. Your meal plan may vary depending on factors such as: The calories you need. The medicines you take. Your weight. Your blood glucose, blood pressure, and cholesterol levels. Your activity level. Other health conditions you have, such as heart or kidney disease. How do carbohydrates affect me? Carbohydrates, also called carbs, affect your blood glucose level more than any other type of food. Eating carbs naturally raises the amount of glucose in your blood. Carb counting is a method for keeping track of how many carbs you eat. Counting carbs is important to keep your blood glucose at a healthy level, especially if you use insulin or take certain oral diabetes medicines. It is important to know how many carbs you can safely have in each meal. This is different for every person. Your dietitian can help you calculate how many carbs you should have at each meal and for each snack. How does alcohol affect me? Alcohol can cause a sudden decrease in blood glucose (hypoglycemia), especially if you use insulin or take certain oral diabetes medicines. Hypoglycemia can be a  life-threatening condition. Symptoms of hypoglycemia, such as sleepiness, dizziness, and confusion, are similar to symptoms of having too much alcohol. Do not drink alcohol if: Your health care provider tells you not to drink. You are pregnant, may be pregnant, or are planning to become pregnant. If you drink alcohol: Do not drink on an empty stomach. Limit how much you use to: 0-1 drink a day for women. 0-2 drinks a day for men. Be aware of how much alcohol is in your drink. In the [place] one drink equals one 12 oz bottle of beer (355 mL), one 5 oz glass of wine (148 mL), or one 1 oz glass of hard liquor (44 mL). Keep yourself hydrated with water, diet soda, or unsweetened iced tea. Keep in mind that regular soda, juice, and other mixers may contain a lot of sugar and must be counted as carbs. What are tips for following this plan? Reading food labels Start by checking the serving size on the "Nutrition Facts" label of packaged foods and drinks. The amount of calories, carbs, fats, and other nutrients listed on the label is based on one serving of the item. Many items contain more than one serving per package. Check the total grams (g) of carbs in one serving. You can calculate the number of servings of carbs in one serving by dividing the total carbs by 15. For example, if a food has 30 g of total carbs per serving, it would be equal to 2 servings of carbs. Check the number of grams (g) of saturated fats and trans fats in one serving. Choose foods that have a low  amount or none of these fats. Check the number of milligrams (mg) of salt (sodium) in one serving. Most people should limit total sodium intake to less than 2,300 mg per day. Always check the nutrition information of foods labeled as "low-fat" or "nonfat." These foods may be higher in added sugar or refined carbs and should be avoided. Talk to your dietitian to identify your daily goals for nutrients listed on the  label. Shopping Avoid buying canned, pre-made, or processed foods. These foods tend to be high in fat, sodium, and added sugar. Shop around the outside edge of the grocery store. This is where you will most often find fresh fruits and vegetables, bulk grains, fresh meats, and fresh dairy. Cooking Use low-heat cooking methods, such as baking, instead of high-heat cooking methods like deep frying. Cook using healthy oils, such as olive, canola, or sunflower oil. Avoid cooking with butter, cream, or high-fat meats. Meal planning Eat meals and snacks regularly, preferably at the same times every day. Avoid going long periods of time without eating. Eat foods that are high in fiber, such as fresh fruits, vegetables, beans, and whole grains. Talk with your dietitian about how many servings of carbs you can eat at each meal. Eat 4-6 oz (112-168 g) of lean protein each day, such as lean meat, chicken, fish, eggs, or tofu. One ounce (oz) of lean protein is equal to: 1 oz (28 g) of meat, chicken, or fish. 1 egg.  cup (62 g) of tofu. Eat some foods each day that contain healthy fats, such as avocado, nuts, seeds, and fish. What foods should I eat? Fruits Berries. Apples. Oranges. Peaches. Apricots. Plums. Grapes. Mango. Papaya. Pomegranate. Kiwi. Cherries. Vegetables Lettuce. Spinach. Leafy greens, including kale, chard, collard greens, and mustard greens. Beets. Cauliflower. Cabbage. Broccoli. Carrots. Green beans. Tomatoes. Peppers. Onions. Cucumbers. Brussels sprouts. Grains Whole grains, such as whole-wheat or whole-grain bread, crackers, tortillas, cereal, and pasta. Unsweetened oatmeal. Quinoa. Brown or wild rice. Meats and other proteins Seafood. Poultry without skin. Lean cuts of poultry and beef. Tofu. Nuts. Seeds. Dairy Low-fat or fat-free dairy products such as milk, yogurt, and cheese. The items listed above may not be a complete list of foods and beverages you can eat. Contact a  dietitian for more information. What foods should I avoid? Fruits Fruits canned with syrup. Vegetables Canned vegetables. Frozen vegetables with butter or cream sauce. Grains Refined white flour and flour products such as bread, pasta, snack foods, and cereals. Avoid all processed foods. Meats and other proteins Fatty cuts of meat. Poultry with skin. Breaded or fried meats. Processed meat. Avoid saturated fats. Dairy Full-fat yogurt, cheese, or milk. Beverages Sweetened drinks, such as soda or iced tea. The items listed above may not be a complete list of foods and beverages you should avoid. Contact a dietitian for more information. Questions to ask a health care provider Do I need to meet with a diabetes educator? Do I need to meet with a dietitian? What number can I call if I have questions? When are the best times to check my blood glucose? Where to find more information: American Diabetes Association: diabetes.org Academy of Nutrition and Dietetics: www.eatright.[employer] of Diabetes and Digestive and Kidney Diseases: [employer] Association of Diabetes Care and Education Specialists: www.diabeteseducator.org Summary It is important to have healthy eating habits because your blood sugar (glucose) levels are greatly affected by what you eat and drink. A healthy meal plan will help you control your blood glucose and  maintain a healthy lifestyle. Your health care provider may recommend that you work with a dietitian to make a meal plan that is best for you. Keep in mind that carbohydrates (carbs) and alcohol have immediate effects on your blood glucose levels. It is important to count carbs and to use alcohol carefully. This information is not intended to replace advice given to you by your health care provider. Make sure you discuss any questions you have with your health care provider. Document Revised: 12/06/2018 Document Reviewed: 12/06/2018 Elsevier Patient  Education  2021 [employer].

## 2020-11-07 NOTE — Assessment & Plan Note (Signed)
Well-controlled hypertension. BP Readings from Last 3 Encounters:  10/10/20 136/80  08/27/20 (!) 162/88  01/22/16 (!) 152/96  Continue amlodipine 5 mg daily and Zestoretic 10-12.5 mg daily. Uncontrolled diabetes.  Glucose today 230. Continue metformin and Jardiance. We will add weekly Trulicity 0.04 mg.  Demonstration pen used. Follow-up in 3 months.

## 2020-11-07 NOTE — Assessment & Plan Note (Signed)
Most likely diabetic retinopathy.  Ophthalmology referral placed.

## 2020-11-11 ENCOUNTER — Telehealth: Payer: Self-pay | Admitting: Emergency Medicine

## 2020-11-11 NOTE — Telephone Encounter (Signed)
Pt. Has called and states referral to [facility] was not sent over. States she wants to schedule appt. ASAP.   Please advise.   Callback #- [phone]

## 2020-12-04 ENCOUNTER — Other Ambulatory Visit (HOSPITAL_COMMUNITY): Payer: Self-pay

## 2021-01-29 ENCOUNTER — Other Ambulatory Visit (HOSPITAL_COMMUNITY): Payer: Self-pay

## 2021-02-04 LAB — HM DIABETES EYE EXAM

## 2021-02-11 ENCOUNTER — Ambulatory Visit: Payer: 59 | Admitting: Emergency Medicine

## 2021-02-18 ENCOUNTER — Encounter: Payer: Self-pay | Admitting: Emergency Medicine

## 2021-02-18 ENCOUNTER — Other Ambulatory Visit: Payer: Self-pay

## 2021-02-18 ENCOUNTER — Ambulatory Visit: Payer: Managed Care, Other (non HMO) | Admitting: Emergency Medicine

## 2021-02-18 VITALS — BP 130/82 | HR 91 | Temp 98.5°F | Ht 66.0 in | Wt 176.5 lb

## 2021-02-18 DIAGNOSIS — E1165 Type 2 diabetes mellitus with hyperglycemia: Secondary | ICD-10-CM | POA: Diagnosis not present

## 2021-02-18 DIAGNOSIS — H547 Unspecified visual loss: Secondary | ICD-10-CM

## 2021-02-18 DIAGNOSIS — I152 Hypertension secondary to endocrine disorders: Secondary | ICD-10-CM

## 2021-02-18 DIAGNOSIS — E1159 Type 2 diabetes mellitus with other circulatory complications: Secondary | ICD-10-CM | POA: Diagnosis not present

## 2021-02-18 DIAGNOSIS — E1169 Type 2 diabetes mellitus with other specified complication: Secondary | ICD-10-CM

## 2021-02-18 DIAGNOSIS — R21 Rash and other nonspecific skin eruption: Secondary | ICD-10-CM

## 2021-02-18 DIAGNOSIS — E785 Hyperlipidemia, unspecified: Secondary | ICD-10-CM

## 2021-02-18 DIAGNOSIS — B20 Human immunodeficiency virus [HIV] disease: Secondary | ICD-10-CM

## 2021-02-18 LAB — POCT GLYCOSYLATED HEMOGLOBIN (HGB A1C): Hemoglobin A1C: 6.1 % — AB (ref 4.0–5.6)

## 2021-02-18 NOTE — Assessment & Plan Note (Signed)
Stable.  Continue present medications.  Follows up with ID doctor.

## 2021-02-18 NOTE — Assessment & Plan Note (Signed)
Well-controlled hypertension.  Continue Zestoretic 10-12.5 and amlodipine 5 mg daily. BP Readings from Last 3 Encounters:  02/18/21 130/82  11/07/20 132/80  10/10/20 136/80  Well-controlled diabetes with hemoglobin A1c of 6.1. Continue Jardiance 10 mg, Trulicity 7.40 mg weekly, and metformin 500 mg twice a day.  Diet and nutrition discussed. Cardiovascular risks associated with diabetes and hypertension discussed. Follow-up in 3 months.

## 2021-02-18 NOTE — Assessment & Plan Note (Signed)
Stable.  Diet and nutrition discussed.  Continue atorvastatin 20 mg daily. The 10-year ASCVD risk score (Arnett DK, et al., 2019) is: 7.8%   Values used to calculate the score:     Age: 51 years     Sex: Female     Is Non-Hispanic African American: Yes     Diabetic: Yes     Tobacco smoker: No     Systolic Blood Pressure: 916 mmHg     Is BP treated: Yes     HDL Cholesterol: 64 mg/dL     Total Cholesterol: 189 mg/dL

## 2021-02-18 NOTE — Addendum Note (Signed)
Addended by: Davina Poke on: 02/18/2021 04:49 PM   Modules accepted: Orders

## 2021-02-18 NOTE — Patient Instructions (Signed)

## 2021-02-18 NOTE — Progress Notes (Addendum)
Katrina Parker 51 y.o.   Chief Complaint  Patient presents with   Follow-up   Diabetes    HISTORY OF PRESENT ILLNESS: This is a 51 y.o. female with history of diabetes here for follow-up. Doing well.  Presently taking Jardiance 10 mg, metformin 500 mg twice a day and weekly Trulicity 5.36 mg. Also history of hypertension on amlodipine 5 mg and Zestoretic 10-12.5 mg daily On atorvastatin 20 mg daily. History of HIV disease on medications.  Stable.  Diabetes Pertinent negatives for diabetes include no chest pain.    Prior to Admission medications   Medication Sig Start Date End Date Taking? Authorizing Provider  amLODipine (NORVASC) 5 MG tablet Take 1 tablet (5 mg total) by mouth daily. 08/27/20  Yes Emmerie Battaglia, Ines Bloomer, MD  atorvastatin (LIPITOR) 20 MG tablet Take 1 tablet (20 mg total) by mouth daily. 08/27/20  Yes Chemika Nightengale, Ines Bloomer, MD  Dulaglutide (TRULICITY) 6.44 IH/4.7QQ SOPN Inject 0.75 mg into the skin once a week. 11/07/20  Yes Alena Blankenbeckler, Ines Bloomer, MD  elvitegravir-cobicistat-emtricitabine-tenofovir (GENVOYA) 150-150-200-10 MG TABS tablet Take 1 tablet by mouth daily with breakfast. 03/13/15  Yes Campbell Riches, MD  empagliflozin (JARDIANCE) 10 MG TABS tablet Take 1 tablet (10 mg total) by mouth daily before breakfast. 08/27/20 03/04/21 Yes Sila Sarsfield, Ines Bloomer, MD  fluticasone [facility]) 50 MCG/ACT nasal spray Place into the nose. 10/20/17  Yes [provider]  glucose monitoring kit (FREESTYLE) monitoring kit 1 each by Does not apply route 2 (two) times daily at 8 am and 10 pm. 09/14/13  Yes Dorena Dew, FNP  lisinopril-hydrochlorothiazide (ZESTORETIC) 10-12.5 MG tablet Take 1 tablet by mouth daily. 08/27/20  Yes Horald Pollen, MD  metFORMIN (GLUCOPHAGE) 500 MG tablet Take 1 tablet (500 mg total) by mouth 2 (two) times daily with a meal. 08/27/20  Yes [place], Ines Bloomer, MD  ONE TOUCH ULTRA TEST test strip Reported on 06/26/2015 11/03/13  Yes [provider]  fluconazole (DIFLUCAN) 100 MG tablet Take 1 tablet (100 mg total) by mouth daily. 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   Hypertension associated with diabetes ([place]) 10/10/2020   Visual problems 10/10/2020   Vision changes 10/20/2017   Seasonal allergic rhinitis due to pollen 10/20/2017   Diabetes type 2, uncontrolled 09/13/2013   Obesity 09/13/2013   History of uterine fibroid 09/13/2013   Essential hypertension, benign 02/22/2012   Chronic right hip pain 02/22/2012   Post herpetic neuralgia 05/20/2010   ABDOMINAL WALL HERNIA 12/24/2006   Human immunodeficiency virus (HIV) disease ([place]) 09/29/2006    Past Medical History:  Diagnosis Date   Diabetes mellitus without complication ([place])    HIV (human immunodeficiency virus infection) ([place])     Past Surgical History:  Procedure Laterality Date   OTHER SURGICAL HISTORY     2 abdominal surgeries (1 in [place], 1 in [place]; unsure kind of procedure, states there might have been a "tumor")    Social History   Socioeconomic History   Marital status: Widowed    Spouse name: Not on file   Number of children: Not on file   Years of education: Not on file   Highest education level: Not on file  Occupational History   Not on file  Tobacco Use   Smoking status: Never  Smokeless tobacco: Never  Substance and Sexual Activity   Alcohol use: No    Alcohol/week: 0.0 standard drinks   Drug use: No   Sexual activity: Not Currently    Partners: Male    Comment: declined condoms  Other Topics Concern   Not on file  Social History Narrative   Not on file   Social Determinants of Health   Financial Resource Strain: Not on file  Food Insecurity: Not on file  Transportation Needs: Not on file  Physical Activity: Not on file  Stress: Not on  file  Social Connections: Not on file  Intimate Partner Violence: Not on file    Family History  Problem Relation Age of Onset   Diabetes Neg Hx    Heart disease Neg Hx    Cancer Neg Hx      Review of Systems  Constitutional: Negative.  Negative for chills and fever.  HENT: Negative.  Negative for congestion and sore throat.   Respiratory: Negative.  Negative for cough and shortness of breath.   Cardiovascular: Negative.  Negative for chest pain and palpitations.  Gastrointestinal: Negative.  Negative for abdominal pain, diarrhea, nausea and vomiting.  Genitourinary: Negative.   Skin:  Positive for rash (Both hands).  All other systems reviewed and are negative.  Today's Vitals   02/18/21 1437  BP: 130/82  Pulse: 91  Temp: 98.5 F (36.9 C)  TempSrc: Oral  SpO2: 97%  Weight: 176 lb 8 oz (80.1 kg)  Height: 5' 6"  (1.676 m)   Body mass index is 28.49 kg/m. Wt Readings from Last 3 Encounters:  02/18/21 176 lb 8 oz (80.1 kg)  11/07/20 185 lb (83.9 kg)  10/10/20 186 lb (84.4 kg)    Physical Exam Vitals reviewed.  Constitutional:      Appearance: Normal appearance.  HENT:     Head: Normocephalic.  Eyes:     Extraocular Movements: Extraocular movements intact.     Conjunctiva/sclera: Conjunctivae normal.     Pupils: Pupils are equal, round, and reactive to light.  Cardiovascular:     Pulses: Normal pulses.     Heart sounds: Normal heart sounds.  Pulmonary:     Effort: Pulmonary effort is normal.     Breath sounds: Normal breath sounds.  Skin:    Findings: Rash present.     Comments: Hyperpigmented dots to palmar surfaces of both hands  Neurological:     Mental Status: She is alert.    Results for orders placed or performed in visit on 02/18/21 (from the past 24 hour(s))  POCT HgB A1C     Status: Abnormal   Collection Time: 02/18/21  2:49 PM  Result Value Ref Range   Hemoglobin A1C 6.1 (A) 4.0 - 5.6 %   HbA1c POC (<> result, manual entry)     HbA1c, POC  (prediabetic range)     HbA1c, POC (controlled diabetic range)      ASSESSMENT & PLAN: Problem List Items Addressed This Visit       Cardiovascular and Mediastinum   Hypertension associated with diabetes ([place])    Well-controlled hypertension.  Continue Zestoretic 10-12.5 and amlodipine 5 mg daily. BP Readings from Last 3 Encounters:  02/18/21 130/82  11/07/20 132/80  10/10/20 136/80  Well-controlled diabetes with hemoglobin A1c of 6.1. Continue Jardiance 10 mg, Trulicity 8.11 mg weekly, and metformin 500 mg twice a day.  Diet and nutrition discussed. Cardiovascular risks associated with diabetes and hypertension discussed. Follow-up in 3 months.  Endocrine   Diabetes type 2, uncontrolled - Primary   Relevant Orders   POCT HgB A1C (Completed)   Dyslipidemia associated with type 2 diabetes mellitus ([place])    Stable.  Diet and nutrition discussed.  Continue atorvastatin 20 mg daily. The 10-year ASCVD risk score (Arnett DK, et al., 2019) is: 7.8%   Values used to calculate the score:     Age: 78 years     Sex: Female     Is Non-Hispanic African American: Yes     Diabetic: Yes     Tobacco smoker: No     Systolic Blood Pressure: 324 mmHg     Is BP treated: Yes     HDL Cholesterol: 64 mg/dL     Total Cholesterol: 189 mg/dL         Other   Human immunodeficiency virus (HIV) disease ([facility])    Stable.  Continue present medications.  Follows up with ID doctor.      Visual problems    Recently operated for cataracts.  Had right eye done.  Scheduled for surgery on the left eye in April.      Other Visit Diagnoses     Rash and nonspecific skin eruption       Relevant Orders   Ambulatory referral to Dermatology        Patient Instructions  Diabetes Mellitus and Nutrition, Adult When you have diabetes, or diabetes mellitus, it is very important to have healthy eating habits because your blood sugar (glucose) levels are greatly affected by what you eat and drink.  Eating healthy foods in the right amounts, at about the same times every day, can help you: Manage your blood glucose. Lower your risk of heart disease. Improve your blood pressure. Reach or maintain a healthy weight. What can affect my meal plan? Every person with diabetes is different, and each person has different needs for a meal plan. Your health care provider may recommend that you work with a dietitian to make a meal plan that is best for you. Your meal plan may vary depending on factors such as: The calories you need. The medicines you take. Your weight. Your blood glucose, blood pressure, and cholesterol levels. Your activity level. Other health conditions you have, such as heart or kidney disease. How do carbohydrates affect me? Carbohydrates, also called carbs, affect your blood glucose level more than any other type of food. Eating carbs raises the amount of glucose in your blood. It is important to know how many carbs you can safely have in each meal. This is different for every person. Your dietitian can help you calculate how many carbs you should have at each meal and for each snack. How does alcohol affect me? Alcohol can cause a decrease in blood glucose (hypoglycemia), especially if you use insulin or take certain diabetes medicines by mouth. Hypoglycemia can be a life-threatening condition. Symptoms of hypoglycemia, such as sleepiness, dizziness, and confusion, are similar to symptoms of having too much alcohol. Do not drink alcohol if: Your health care provider tells you not to drink. You are pregnant, may be pregnant, or are planning to become pregnant. If you drink alcohol: Limit how much you have to: 0-1 drink a day for women. 0-2 drinks a day for men. Know how much alcohol is in your drink. In the [place] one drink equals one 12 oz bottle of beer (355 mL), one 5 oz glass of wine (148 mL), or one 1 oz glass of hard liquor (  44 mL). Keep yourself hydrated with water,  diet soda, or unsweetened iced tea. Keep in mind that regular soda, juice, and other mixers may contain a lot of sugar and must be counted as carbs. What are tips for following this plan? Reading food labels Start by checking the serving size on the Nutrition Facts label of packaged foods and drinks. The number of calories and the amount of carbs, fats, and other nutrients listed on the label are based on one serving of the item. Many items contain more than one serving per package. Check the total grams (g) of carbs in one serving. Check the number of grams of saturated fats and trans fats in one serving. Choose foods that have a low amount or none of these fats. Check the number of milligrams (mg) of salt (sodium) in one serving. Most people should limit total sodium intake to less than 2,300 mg per day. Always check the nutrition information of foods labeled as "low-fat" or "nonfat." These foods may be higher in added sugar or refined carbs and should be avoided. Talk to your dietitian to identify your daily goals for nutrients listed on the label. Shopping Avoid buying canned, pre-made, or processed foods. These foods tend to be high in fat, sodium, and added sugar. Shop around the outside edge of the grocery store. This is where you will most often find fresh fruits and vegetables, bulk grains, fresh meats, and fresh dairy products. Cooking Use low-heat cooking methods, such as baking, instead of high-heat cooking methods, such as deep frying. Cook using healthy oils, such as olive, canola, or sunflower oil. Avoid cooking with butter, cream, or high-fat meats. Meal planning Eat meals and snacks regularly, preferably at the same times every day. Avoid going long periods of time without eating. Eat foods that are high in fiber, such as fresh fruits, vegetables, beans, and whole grains. Eat 4-6 oz (112-168 g) of lean protein each day, such as lean meat, chicken, fish, eggs, or tofu. One ounce  (oz) (28 g) of lean protein is equal to: 1 oz (28 g) of meat, chicken, or fish. 1 egg.  cup (62 g) of tofu. Eat some foods each day that contain healthy fats, such as avocado, nuts, seeds, and fish. What foods should I eat? Fruits Berries. Apples. Oranges. Peaches. Apricots. Plums. Grapes. Mangoes. Papayas. Pomegranates. Kiwi. Cherries. Vegetables Leafy greens, including lettuce, spinach, kale, chard, collard greens, mustard greens, and cabbage. Beets. Cauliflower. Broccoli. Carrots. Green beans. Tomatoes. Peppers. Onions. Cucumbers. Brussels sprouts. Grains Whole grains, such as whole-wheat or whole-grain bread, crackers, tortillas, cereal, and pasta. Unsweetened oatmeal. Quinoa. Brown or wild rice. Meats and other proteins Seafood. Poultry without skin. Lean cuts of poultry and beef. Tofu. Nuts. Seeds. Dairy Low-fat or fat-free dairy products such as milk, yogurt, and cheese. The items listed above may not be a complete list of foods and beverages you can eat and drink. Contact a dietitian for more information. What foods should I avoid? Fruits Fruits canned with syrup. Vegetables Canned vegetables. Frozen vegetables with butter or cream sauce. Grains Refined white flour and flour products such as bread, pasta, snack foods, and cereals. Avoid all processed foods. Meats and other proteins Fatty cuts of meat. Poultry with skin. Breaded or fried meats. Processed meat. Avoid saturated fats. Dairy Full-fat yogurt, cheese, or milk. Beverages Sweetened drinks, such as soda or iced tea. The items listed above may not be a complete list of foods and beverages you should avoid. Contact a dietitian  for more information. Questions to ask a health care provider Do I need to meet with a certified diabetes care and education specialist? Do I need to meet with a dietitian? What number can I call if I have questions? When are the best times to check my blood glucose? Where to find more  information: American Diabetes Association: diabetes.org Academy of Nutrition and Dietetics: eatright.[employer] of Diabetes and Digestive and Kidney Diseases: [employer] Association of Diabetes Care & Education Specialists: diabeteseducator.org Summary It is important to have healthy eating habits because your blood sugar (glucose) levels are greatly affected by what you eat and drink. It is important to use alcohol carefully. A healthy meal plan will help you manage your blood glucose and lower your risk of heart disease. Your health care provider may recommend that you work with a dietitian to make a meal plan that is best for you. This information is not intended to replace advice given to you by your health care provider. Make sure you discuss any questions you have with your health care provider. Document Revised: 08/02/2019 Document Reviewed: 08/02/2019 Elsevier Patient Education  2022 [place], [place] [facility]

## 2021-02-18 NOTE — Assessment & Plan Note (Signed)
Recently operated for cataracts.  Had right eye done.  Scheduled for surgery on the left eye in April.

## 2021-03-07 ENCOUNTER — Telehealth: Payer: Self-pay | Admitting: Emergency Medicine

## 2021-03-07 ENCOUNTER — Other Ambulatory Visit (HOSPITAL_COMMUNITY): Payer: Self-pay

## 2021-03-07 ENCOUNTER — Other Ambulatory Visit: Payer: Self-pay | Admitting: Emergency Medicine

## 2021-03-07 DIAGNOSIS — E1165 Type 2 diabetes mellitus with hyperglycemia: Secondary | ICD-10-CM

## 2021-03-07 MED ORDER — EMPAGLIFLOZIN 10 MG PO TABS
10.0000 mg | ORAL_TABLET | Freq: Every day | ORAL | 1 refills | Status: DC
  Filled 2021-03-07 (×2): qty 90, 90d supply, fill #0

## 2021-03-07 NOTE — Telephone Encounter (Signed)
Pharmacy states pts insurance will not cover empagliflozin (JARDIANCE) 10 MG TABS tablet  Pharmacy inquiring if an alternative rx can be sent

## 2021-03-10 ENCOUNTER — Other Ambulatory Visit: Payer: Self-pay | Admitting: Emergency Medicine

## 2021-03-10 ENCOUNTER — Other Ambulatory Visit (HOSPITAL_COMMUNITY): Payer: Self-pay

## 2021-03-10 DIAGNOSIS — E1165 Type 2 diabetes mellitus with hyperglycemia: Secondary | ICD-10-CM

## 2021-03-10 MED ORDER — DAPAGLIFLOZIN PROPANEDIOL 5 MG PO TABS
5.0000 mg | ORAL_TABLET | Freq: Every day | ORAL | 3 refills | Status: AC
  Filled 2021-03-10: qty 90, 90d supply, fill #0

## 2021-03-10 NOTE — Telephone Encounter (Signed)
Called patient and left voice message to inform patient of her medication change. Medication Wilder Glade was sent to her pharmacy

## 2021-03-10 NOTE — Telephone Encounter (Signed)
Okay, we will switch to [place] 5 mg daily which is covered by her insurance.  Thanks.

## 2021-03-12 ENCOUNTER — Other Ambulatory Visit (HOSPITAL_COMMUNITY): Payer: Self-pay

## 2021-05-15 ENCOUNTER — Other Ambulatory Visit (HOSPITAL_COMMUNITY): Payer: Self-pay

## 2021-05-19 ENCOUNTER — Ambulatory Visit: Payer: Commercial Managed Care - HMO | Admitting: Emergency Medicine

## 2021-05-28 ENCOUNTER — Other Ambulatory Visit (HOSPITAL_COMMUNITY): Payer: Self-pay

## 2021-05-28 MED ORDER — PREDNISOLONE ACETATE 1 % OP SUSP
1.0000 [drp] | Freq: Every day | OPHTHALMIC | 0 refills | Status: DC
  Filled 2021-05-28: qty 5, 50d supply, fill #0
  Filled 2021-05-29: qty 10, 90d supply, fill #0

## 2021-05-29 ENCOUNTER — Encounter: Payer: Self-pay | Admitting: Emergency Medicine

## 2021-05-29 ENCOUNTER — Other Ambulatory Visit (HOSPITAL_COMMUNITY): Payer: Self-pay

## 2021-05-29 ENCOUNTER — Ambulatory Visit (INDEPENDENT_AMBULATORY_CARE_PROVIDER_SITE_OTHER): Payer: Commercial Managed Care - HMO | Admitting: Emergency Medicine

## 2021-05-29 VITALS — BP 124/76 | HR 87 | Temp 97.9°F | Ht 66.0 in | Wt 188.0 lb

## 2021-05-29 DIAGNOSIS — B20 Human immunodeficiency virus [HIV] disease: Secondary | ICD-10-CM

## 2021-05-29 DIAGNOSIS — E1169 Type 2 diabetes mellitus with other specified complication: Secondary | ICD-10-CM | POA: Diagnosis not present

## 2021-05-29 DIAGNOSIS — Z1231 Encounter for screening mammogram for malignant neoplasm of breast: Secondary | ICD-10-CM

## 2021-05-29 DIAGNOSIS — Z1211 Encounter for screening for malignant neoplasm of colon: Secondary | ICD-10-CM

## 2021-05-29 DIAGNOSIS — E1159 Type 2 diabetes mellitus with other circulatory complications: Secondary | ICD-10-CM | POA: Diagnosis not present

## 2021-05-29 DIAGNOSIS — I152 Hypertension secondary to endocrine disorders: Secondary | ICD-10-CM

## 2021-05-29 DIAGNOSIS — E785 Hyperlipidemia, unspecified: Secondary | ICD-10-CM

## 2021-05-29 LAB — POCT GLYCOSYLATED HEMOGLOBIN (HGB A1C): Hemoglobin A1C: 6.4 % — AB (ref 4.0–5.6)

## 2021-05-29 NOTE — Assessment & Plan Note (Signed)
Well-controlled hypertension. BP Readings from Last 3 Encounters:  05/29/21 124/76  02/18/21 130/82  11/07/20 132/80  Continue Zestoretic 10-12 0.5 and amlodipine 5 mg daily. Dietary approaches to stop hypertension discussed. Well-controlled diabetes with hemoglobin A1c of 6.4. Continue weekly Trulicity 7.67 mg, metformin 500 mg twice a day, and Jardiance 10 mg daily. Cardiovascular risks associated with hypertension and diabetes discussed. Diet and nutrition discussed. Follow-up in 3 months.

## 2021-05-29 NOTE — Patient Instructions (Signed)

## 2021-05-29 NOTE — Progress Notes (Signed)
Katrina Parker 51 y.o.   Chief Complaint  Patient presents with   Follow-up   Medication Management    Wilder Glade gave patient a rash, stopped taking medication    HISTORY OF PRESENT ILLNESS: This is a 51 y.o. female with history of diabetes, hypertension, dyslipidemia here for follow-up Presently on Trulicity, metformin, and Jardiance for diabetes. Hypertension on amlodipine and Hyzaar Dyslipidemia on atorvastatin Developed rash with [place].  However states she is taking Jardiance. No other complaints or medical concerns today. Lab Results  Component Value Date   HGBA1C 6.1 (A) 02/18/2021     HPI   Prior to Admission medications   Medication Sig Start Date End Date Taking? Authorizing Provider  amLODipine (NORVASC) 5 MG tablet Take 1 tablet (5 mg total) by mouth daily. 08/27/20  Yes Grantham Hippert, Ines Bloomer, MD  atorvastatin (LIPITOR) 20 MG tablet Take 1 tablet (20 mg total) by mouth daily. 08/27/20  Yes Treveon Bourcier, Ines Bloomer, MD  Dulaglutide (TRULICITY) 3.38 SN/0.5LZ SOPN Inject 0.75 mg into the skin once a week. 11/07/20  Yes Arzell Mcgeehan, Ines Bloomer, MD  elvitegravir-cobicistat-emtricitabine-tenofovir (GENVOYA) 150-150-200-10 MG TABS tablet Take 1 tablet by mouth daily with breakfast. 03/13/15  Yes Campbell Riches, MD  fluconazole (DIFLUCAN) 100 MG tablet Take 1 tablet (100 mg total) by mouth daily. 01/22/16  Yes Campbell Riches, MD  fluticasone (FLONASE) 50 MCG/ACT nasal spray Place into the nose. 10/20/17  Yes [provider]  glucose monitoring kit (FREESTYLE) monitoring kit 1 each by Does not apply route 2 (two) times daily at 8 am and 10 pm. 09/14/13  Yes Dorena Dew, FNP  JARDIANCE 10 MG TABS tablet tablet 05/28/21  Yes [provider]  lisinopril-hydrochlorothiazide (ZESTORETIC) 10-12.5 MG tablet Take 1 tablet by mouth daily. 08/27/20  Yes Horald Pollen, MD  metFORMIN (GLUCOPHAGE) 500 MG tablet Take 1 tablet (500 mg total) by mouth 2 (two) times  daily with a meal. 08/27/20  Yes Kashlyn Salinas, Ines Bloomer, MD  ONE TOUCH ULTRA TEST test strip Reported on 06/26/2015 11/03/13  Yes [provider]  prednisoLONE acetate (PRED FORTE) 1 % ophthalmic suspension Place 1 drop into both eyes daily. 05/28/21  Yes   dapagliflozin propanediol (FARXIGA) 5 MG TABS tablet Take 1 tablet (5 mg total) by mouth daily before breakfast. 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   Hypertension associated with diabetes ([place]) 10/10/2020   Visual problems 10/10/2020   Vision changes 10/20/2017   Seasonal allergic rhinitis due to pollen 10/20/2017   Diabetes type 2, uncontrolled 09/13/2013   Obesity 09/13/2013   History of uterine fibroid 09/13/2013   Essential hypertension, benign 02/22/2012   Chronic right hip pain 02/22/2012   Post herpetic neuralgia 05/20/2010   ABDOMINAL WALL HERNIA 12/24/2006   Human immunodeficiency virus (HIV) disease ([place]) 09/29/2006    Past Medical History:  Diagnosis Date   Diabetes mellitus without complication ([place])    HIV (human immunodeficiency virus infection) ([place])     Past Surgical History:  Procedure Laterality Date   OTHER SURGICAL HISTORY     2 abdominal surgeries (1 in [place], 1 in [place]; unsure kind of procedure, states there might have been a "tumor")    Social  History   Socioeconomic History   Marital status: Widowed    Spouse name: Not on file   Number of children: Not on file   Years of education: Not on file   Highest education level: Not on file  Occupational History   Not on file  Tobacco Use   Smoking status: Never   Smokeless tobacco: Never  Substance and Sexual Activity   Alcohol use: No     Alcohol/week: 0.0 standard drinks   Drug use: No   Sexual activity: Not Currently    Partners: Male    Comment: declined condoms  Other Topics Concern   Not on file  Social History Narrative   Not on file   Social Determinants of Health   Financial Resource Strain: Not on file  Food Insecurity: Not on file  Transportation Needs: Not on file  Physical Activity: Not on file  Stress: Not on file  Social Connections: Not on file  Intimate Partner Violence: Not on file    Family History  Problem Relation Age of Onset   Diabetes Neg Hx    Heart disease Neg Hx    Cancer Neg Hx      Review of Systems  Constitutional: Negative.  Negative for chills and fever.  HENT: Negative.  Negative for congestion and sore throat.   Respiratory: Negative.  Negative for cough and shortness of breath.   Cardiovascular: Negative.  Negative for chest pain and palpitations.  Gastrointestinal:  Negative for abdominal pain, diarrhea, nausea and vomiting.  Genitourinary: Negative.  Negative for dysuria and hematuria.  Skin: Negative.  Negative for rash.  Neurological:  Negative for dizziness and headaches.  All other systems reviewed and are negative.  Today's Vitals   05/29/21 0916  BP: 124/76  Pulse: 87  Temp: 97.9 F (36.6 C)  TempSrc: Oral  SpO2: 99%  Weight: 188 lb (85.3 kg)  Height: 5' 6"  (1.676 m)   Body mass index is 30.34 kg/m. Wt Readings from Last 3 Encounters:  05/29/21 188 lb (85.3 kg)  02/18/21 176 lb 8 oz (80.1 kg)  11/07/20 185 lb (83.9 kg)    Physical Exam Vitals reviewed.  Constitutional:      Appearance: Normal appearance.  HENT:     Head: Normocephalic.  Eyes:     Extraocular Movements: Extraocular movements intact.     Conjunctiva/sclera: Conjunctivae normal.     Pupils: Pupils are equal, round, and reactive to light.  Cardiovascular:     Rate and Rhythm: Normal rate and regular rhythm.     Pulses: Normal pulses.     Heart sounds: Normal heart sounds.   Pulmonary:     Effort: Pulmonary effort is normal.     Breath sounds: Normal breath sounds.  Abdominal:     Palpations: Abdomen is soft.     Tenderness: There is no abdominal tenderness.  Musculoskeletal:        General: Normal range of motion.     Cervical back: No tenderness.  Lymphadenopathy:     Cervical: No cervical adenopathy.  Skin:    General: Skin is warm and dry.     Capillary Refill: Capillary refill takes less than 2 seconds.  Neurological:     General: No focal deficit present.     Mental Status: She is alert and oriented to person, place, and time.  Psychiatric:        Mood and Affect: Mood normal.        Behavior: Behavior normal.  Results for orders placed or performed in visit on 05/29/21 (from the past 24 hour(s))  POCT glycosylated hemoglobin (Hb A1C)     Status: Abnormal   Collection Time: 05/29/21  9:49 AM  Result Value Ref Range   Hemoglobin A1C 6.4 (A) 4.0 - 5.6 %   HbA1c POC (<> result, manual entry)     HbA1c, POC (prediabetic range)     HbA1c, POC (controlled diabetic range)      ASSESSMENT & PLAN: A total of 49 minutes was spent with the patient and counseling/coordination of care regarding preparing for this visit, review of most recent office visit notes, review of most recent blood work results including today's hemoglobin A1c, review of all medications, education on nutrition, cardiovascular risks associated with hypertension and diabetes, prognosis, documentation and need for follow-up.  Problem List Items Addressed This Visit       Cardiovascular and Mediastinum   Hypertension associated with diabetes ([place]) - Primary    Well-controlled hypertension. BP Readings from Last 3 Encounters:  05/29/21 124/76  02/18/21 130/82  11/07/20 132/80  Continue Zestoretic 10-12 0.5 and amlodipine 5 mg daily. Dietary approaches to stop hypertension discussed. Well-controlled diabetes with hemoglobin A1c of 6.4. Continue weekly Trulicity 9.56 mg,  metformin 500 mg twice a day, and Jardiance 10 mg daily. Cardiovascular risks associated with hypertension and diabetes discussed. Diet and nutrition discussed. Follow-up in 3 months.       Relevant Medications   JARDIANCE 10 MG TABS tablet   Other Relevant Orders   POCT glycosylated hemoglobin (Hb A1C) (Completed)     Endocrine   Dyslipidemia associated with type 2 diabetes mellitus ([place])   Relevant Medications   JARDIANCE 10 MG TABS tablet     Other   Human immunodeficiency virus (HIV) disease ([place])   Other Visit Diagnoses     Encounter for screening mammogram for malignant neoplasm of breast       Relevant Orders   MM Digital Screening   Colon cancer screening       Relevant Orders   Ambulatory referral to Gastroenterology         Patient Instructions  Diabetes Mellitus and Nutrition, Adult When you have diabetes, or diabetes mellitus, it is very important to have healthy eating habits because your blood sugar (glucose) levels are greatly affected by what you eat and drink. Eating healthy foods in the right amounts, at about the same times every day, can help you: Manage your blood glucose. Lower your risk of heart disease. Improve your blood pressure. Reach or maintain a healthy weight. What can affect my meal plan? Every person with diabetes is different, and each person has different needs for a meal plan. Your health care provider may recommend that you work with a dietitian to make a meal plan that is best for you. Your meal plan may vary depending on factors such as: The calories you need. The medicines you take. Your weight. Your blood glucose, blood pressure, and cholesterol levels. Your activity level. Other health conditions you have, such as heart or kidney disease. How do carbohydrates affect me? Carbohydrates, also called carbs, affect your blood glucose level more than any other type of food. Eating carbs raises the amount of glucose in your  blood. It is important to know how many carbs you can safely have in each meal. This is different for every person. Your dietitian can help you calculate how many carbs you should have at each meal and for each snack.  How does alcohol affect me? Alcohol can cause a decrease in blood glucose (hypoglycemia), especially if you use insulin or take certain diabetes medicines by mouth. Hypoglycemia can be a life-threatening condition. Symptoms of hypoglycemia, such as sleepiness, dizziness, and confusion, are similar to symptoms of having too much alcohol. Do not drink alcohol if: Your health care provider tells you not to drink. You are pregnant, may be pregnant, or are planning to become pregnant. If you drink alcohol: Limit how much you have to: 0-1 drink a day for women. 0-2 drinks a day for men. Know how much alcohol is in your drink. In the [place] one drink equals one 12 oz bottle of beer (355 mL), one 5 oz glass of wine (148 mL), or one 1 oz glass of hard liquor (44 mL). Keep yourself hydrated with water, diet soda, or unsweetened iced tea. Keep in mind that regular soda, juice, and other mixers may contain a lot of sugar and must be counted as carbs. What are tips for following this plan?  Reading food labels Start by checking the serving size on the Nutrition Facts label of packaged foods and drinks. The number of calories and the amount of carbs, fats, and other nutrients listed on the label are based on one serving of the item. Many items contain more than one serving per package. Check the total grams (g) of carbs in one serving. Check the number of grams of saturated fats and trans fats in one serving. Choose foods that have a low amount or none of these fats. Check the number of milligrams (mg) of salt (sodium) in one serving. Most people should limit total sodium intake to less than 2,300 mg per day. Always check the nutrition information of foods labeled as "low-fat" or "nonfat." These  foods may be higher in added sugar or refined carbs and should be avoided. Talk to your dietitian to identify your daily goals for nutrients listed on the label. Shopping Avoid buying canned, pre-made, or processed foods. These foods tend to be high in fat, sodium, and added sugar. Shop around the outside edge of the grocery store. This is where you will most often find fresh fruits and vegetables, bulk grains, fresh meats, and fresh dairy products. Cooking Use low-heat cooking methods, such as baking, instead of high-heat cooking methods, such as deep frying. Cook using healthy oils, such as olive, canola, or sunflower oil. Avoid cooking with butter, cream, or high-fat meats. Meal planning Eat meals and snacks regularly, preferably at the same times every day. Avoid going long periods of time without eating. Eat foods that are high in fiber, such as fresh fruits, vegetables, beans, and whole grains. Eat 4-6 oz (112-168 g) of lean protein each day, such as lean meat, chicken, fish, eggs, or tofu. One ounce (oz) (28 g) of lean protein is equal to: 1 oz (28 g) of meat, chicken, or fish. 1 egg.  cup (62 g) of tofu. Eat some foods each day that contain healthy fats, such as avocado, nuts, seeds, and fish. What foods should I eat? Fruits Berries. Apples. Oranges. Peaches. Apricots. Plums. Grapes. Mangoes. Papayas. Pomegranates. Kiwi. Cherries. Vegetables Leafy greens, including lettuce, spinach, kale, chard, collard greens, mustard greens, and cabbage. Beets. Cauliflower. Broccoli. Carrots. Green beans. Tomatoes. Peppers. Onions. Cucumbers. Brussels sprouts. Grains Whole grains, such as whole-wheat or whole-grain bread, crackers, tortillas, cereal, and pasta. Unsweetened oatmeal. Quinoa. Brown or wild rice. Meats and other proteins Seafood. Poultry without skin. Lean cuts of poultry  and beef. Tofu. Nuts. Seeds. Dairy Low-fat or fat-free dairy products such as milk, yogurt, and cheese. The  items listed above may not be a complete list of foods and beverages you can eat and drink. Contact a dietitian for more information. What foods should I avoid? Fruits Fruits canned with syrup. Vegetables Canned vegetables. Frozen vegetables with butter or cream sauce. Grains Refined white flour and flour products such as bread, pasta, snack foods, and cereals. Avoid all processed foods. Meats and other proteins Fatty cuts of meat. Poultry with skin. Breaded or fried meats. Processed meat. Avoid saturated fats. Dairy Full-fat yogurt, cheese, or milk. Beverages Sweetened drinks, such as soda or iced tea. The items listed above may not be a complete list of foods and beverages you should avoid. Contact a dietitian for more information. Questions to ask a health care provider Do I need to meet with a certified diabetes care and education specialist? Do I need to meet with a dietitian? What number can I call if I have questions? When are the best times to check my blood glucose? Where to find more information: American Diabetes Association: diabetes.org Academy of Nutrition and Dietetics: eatright.[employer] of Diabetes and Digestive and Kidney Diseases: [employer] Association of Diabetes Care & Education Specialists: diabeteseducator.org Summary It is important to have healthy eating habits because your blood sugar (glucose) levels are greatly affected by what you eat and drink. It is important to use alcohol carefully. A healthy meal plan will help you manage your blood glucose and lower your risk of heart disease. Your health care provider may recommend that you work with a dietitian to make a meal plan that is best for you. This information is not intended to replace advice given to you by your health care provider. Make sure you discuss any questions you have with your health care provider. Document Revised: 08/02/2019 Document Reviewed: 08/02/2019 Elsevier Patient  Education  [place], MD [facility]

## 2021-06-06 ENCOUNTER — Other Ambulatory Visit (HOSPITAL_COMMUNITY): Payer: Self-pay

## 2021-07-23 ENCOUNTER — Other Ambulatory Visit (HOSPITAL_COMMUNITY): Payer: Self-pay

## 2021-08-15 ENCOUNTER — Other Ambulatory Visit (HOSPITAL_COMMUNITY): Payer: Self-pay

## 2021-09-04 ENCOUNTER — Encounter: Payer: Self-pay | Admitting: Emergency Medicine

## 2021-09-04 ENCOUNTER — Ambulatory Visit: Payer: Commercial Managed Care - HMO | Admitting: Emergency Medicine

## 2021-09-04 VITALS — BP 130/70 | HR 85 | Temp 98.2°F | Ht 66.0 in | Wt 188.5 lb

## 2021-09-04 DIAGNOSIS — E1165 Type 2 diabetes mellitus with hyperglycemia: Secondary | ICD-10-CM

## 2021-09-04 DIAGNOSIS — M25562 Pain in left knee: Secondary | ICD-10-CM

## 2021-09-04 DIAGNOSIS — G8929 Other chronic pain: Secondary | ICD-10-CM

## 2021-09-04 DIAGNOSIS — B20 Human immunodeficiency virus [HIV] disease: Secondary | ICD-10-CM

## 2021-09-04 DIAGNOSIS — M25561 Pain in right knee: Secondary | ICD-10-CM

## 2021-09-04 DIAGNOSIS — E1169 Type 2 diabetes mellitus with other specified complication: Secondary | ICD-10-CM | POA: Diagnosis not present

## 2021-09-04 DIAGNOSIS — M545 Low back pain, unspecified: Secondary | ICD-10-CM

## 2021-09-04 DIAGNOSIS — E785 Hyperlipidemia, unspecified: Secondary | ICD-10-CM

## 2021-09-04 DIAGNOSIS — Z23 Encounter for immunization: Secondary | ICD-10-CM | POA: Diagnosis not present

## 2021-09-04 DIAGNOSIS — E1159 Type 2 diabetes mellitus with other circulatory complications: Secondary | ICD-10-CM

## 2021-09-04 DIAGNOSIS — I152 Hypertension secondary to endocrine disorders: Secondary | ICD-10-CM

## 2021-09-04 LAB — POCT GLYCOSYLATED HEMOGLOBIN (HGB A1C): Hemoglobin A1C: 5.8 % — AB (ref 4.0–5.6)

## 2021-09-04 NOTE — Patient Instructions (Signed)

## 2021-09-04 NOTE — Progress Notes (Signed)
Katrina Parker 51 y.o.   Chief Complaint  Patient presents with   Follow-up    3 mnth f/u appt , HTN, DM ,    Knee Pain    While standing both knees     HISTORY OF PRESENT ILLNESS: This is a 51 y.o. female here for 11-monthfollow-up of hypertension and diabetes. Also complaining of bilateral knee pain and lumbar pain on and off for the past year but getting worse over the past several months.  HPI   Prior to Admission medications   Medication Sig Start Date End Date Taking? Authorizing Provider  amLODipine (NORVASC) 5 MG tablet Take 1 tablet (5 mg total) by mouth daily. 08/27/20  Yes Mayline Dragon, MInes Bloomer MD  atorvastatin (LIPITOR) 20 MG tablet Take 1 tablet (20 mg total) by mouth daily. 08/27/20  Yes Shellene Sweigert, MInes Bloomer MD  Dulaglutide (TRULICITY) 09.48MNI/6.2VOSOPN Inject 0.75 mg into the skin once a week. 11/07/20  Yes Sonya Pucci, MInes Bloomer MD  elvitegravir-cobicistat-emtricitabine-tenofovir (GENVOYA) 150-150-200-10 MG TABS tablet Take 1 tablet by mouth daily with breakfast. 03/13/15  Yes HCampbell Riches MD  fluconazole (DIFLUCAN) 100 MG tablet Take 1 tablet (100 mg total) by mouth daily. 01/22/16  Yes HCampbell Riches MD  fluticasone (FLONASE) 50 MCG/ACT nasal spray Place into the nose. 10/20/17  Yes [provider]  glucose monitoring kit (FREESTYLE) monitoring kit 1 each by Does not apply route 2 (two) times daily at 8 am and 10 pm. 09/14/13  Yes HDorena Dew FNP  JARDIANCE 10 MG TABS tablet tablet 05/28/21  Yes [provider]  lisinopril-hydrochlorothiazide (ZESTORETIC) 10-12.5 MG tablet Take 1 tablet by mouth daily. 08/27/20  Yes SHorald Pollen MD  metFORMIN (GLUCOPHAGE) 500 MG tablet Take 1 tablet (500 mg total) by mouth 2 (two) times daily with a meal. 08/27/20  Yes Murlin Schrieber, MInes Bloomer MD  ONE TOUCH ULTRA TEST test strip Reported on 06/26/2015 11/03/13  Yes [provider]  prednisoLONE acetate (PRED FORTE) 1 % ophthalmic suspension  Place 1 drop into both eyes daily. 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   Hypertension associated with diabetes ([place] 10/10/2020   Visual problems 10/10/2020   Vision changes 10/20/2017   Seasonal allergic rhinitis due to pollen 10/20/2017   Diabetes type 2, uncontrolled 09/13/2013   Obesity 09/13/2013   History of uterine fibroid 09/13/2013   Essential hypertension, benign 02/22/2012   Chronic right hip pain 02/22/2012   Post herpetic neuralgia 05/20/2010   ABDOMINAL WALL HERNIA 12/24/2006   Human immunodeficiency virus (HIV) disease ([facility] 09/29/2006    Past Medical History:  Diagnosis Date   Diabetes mellitus without complication ([facility] [place]    HIV (human immunodeficiency virus infection) ([place] [place]     Past Surgical History:  Procedure Laterality Date   OTHER SURGICAL HISTORY     [place] kind of [place], states there might have been a "tumor")    Social History   Socioeconomic History   Marital status: Widowed    Spouse name: Not on file   Number of children: Not on file   Years of education: Not on file   Highest education level: Not on file  Occupational History   Not on  file  Tobacco Use   Smoking status: Never   Smokeless tobacco: Never  Substance and Sexual Activity   Alcohol use: No    Alcohol/week: 0.0 standard drinks of alcohol   Drug use: No   Sexual activity: Not Currently    Partners: Male    Comment: declined condoms  Other Topics Concern   Not on file  Social History Narrative   Not on file   Social Determinants of Health   Financial Resource Strain: Not on file  Food Insecurity: Not on file  Transportation Needs: Not on file  Physical Activity: Not on file  Stress: Not on file   Social Connections: Not on file  Intimate Partner Violence: Not on file    Family History  Problem Relation Age of Onset   Diabetes Neg Hx    Heart disease Neg Hx    Cancer Neg Hx      Review of Systems  Constitutional: Negative.  Negative for chills and fever.  HENT: Negative.  Negative for congestion and sore throat.   Respiratory: Negative.  Negative for cough and shortness of breath.   Cardiovascular: Negative.  Negative for chest pain and palpitations.  Gastrointestinal:  Negative for abdominal pain, diarrhea, nausea and vomiting.  Genitourinary: Negative.   Musculoskeletal:  Positive for back pain and joint pain (Bilateral knee pains).  Skin: Negative.  Negative for rash.  Neurological: Negative.  Negative for dizziness and headaches.  All other systems reviewed and are negative.   Today's Vitals   09/04/21 0921  BP: (!) 140/84  Pulse: 85  Temp: 98.2 F (36.8 C)  TempSrc: Oral  SpO2: 95%  Weight: 188 lb 8 oz (85.5 kg)  Height: 5' 6"  (1.676 m)   Body mass index is 30.42 kg/m. Wt Readings from Last 3 Encounters:  09/04/21 188 lb 8 oz (85.5 kg)  05/29/21 188 lb (85.3 kg)  02/18/21 176 lb 8 oz (80.1 kg)    Physical Exam Vitals reviewed.  Constitutional:      Appearance: Normal appearance.  HENT:     Head: Normocephalic.     Mouth/Throat:     Mouth: Mucous membranes are moist.     Pharynx: Oropharynx is clear.  Eyes:     Extraocular Movements: Extraocular movements intact.     Conjunctiva/sclera: Conjunctivae normal.     Pupils: Pupils are equal, round, and reactive to light.  Cardiovascular:     Rate and Rhythm: Normal rate and regular rhythm.     Pulses: Normal pulses.     Heart sounds: Normal heart sounds.  Pulmonary:     Effort: Pulmonary effort is normal.     Breath sounds: Normal breath sounds.  Musculoskeletal:     Cervical back: No tenderness.     Comments: Bilateral knees: Mild swelling with mild tenderness.  Full range of motion Lumbar  spine: No spine tenderness.  Mild muscle tenderness.  Lymphadenopathy:     Cervical: No cervical adenopathy.  Skin:    General: Skin is warm and dry.     Capillary Refill: Capillary refill takes less than 2 seconds.  Neurological:     General: No focal deficit present.     Mental Status: She is alert and oriented to person, place, and time.  Psychiatric:        Mood and Affect: Mood normal.        Behavior: Behavior normal.    Results for orders placed or performed in visit on 09/04/21 (from the past 24 hour(s))  POCT HgB A1C     Status: Abnormal   Collection Time: 09/04/21  9:31 AM  Result Value Ref Range   Hemoglobin A1C 5.8 (A) 4.0 - 5.6 %   HbA1c POC (<> result, manual entry)     HbA1c, POC (prediabetic range)     HbA1c, POC (controlled diabetic range)       ASSESSMENT & PLAN: A total of 49 minutes was spent with the patient and counseling/coordination of care regarding preparing for this visit, review of most recent office visit notes, review of multiple chronic medical problems and their management, review of all medications, review of most recent blood work results and interpretation of today's hemoglobin A1c, education on nutrition, prognosis, documentation, and need for follow-up.  Problem List Items Addressed This Visit       Cardiovascular and Mediastinum   Hypertension associated with diabetes ([place]) - Primary    Well-controlled hypertension. Continue Zestoretic 10-12.5 mg daily and amlodipine 5 mg daily. Well-controlled diabetes with hemoglobin A1c of 5.8 Lab Results  Component Value Date   HGBA1C 5.8 (A) 09/04/2021  Continue weekly Trulicity 7.74 mg, Jardiance 10 mg daily and metformin 500 mg twice a day. Cardiovascular risks associated with hypertension and diabetes discussed. Diet and nutrition discussed. Follow-up in 6 months.       Relevant Orders   Urine Microalbumin w/creat. ratio   Comprehensive metabolic panel     Endocrine   Diabetes type 2,  uncontrolled   Relevant Orders   POCT HgB A1C (Completed)   Dyslipidemia associated with type 2 diabetes mellitus ([place])    Stable.  Diet and nutrition discussed. Continue atorvastatin 20 mg daily. Lipid panel done today. The 10-year ASCVD risk score (Arnett DK, et al., 2019) is: 8.5%   Values used to calculate the score:     Age: 77 years     Sex: Female     Is Non-Hispanic African American: Yes     Diabetic: Yes     Tobacco smoker: No     Systolic Blood Pressure: 142 mmHg     Is BP treated: Yes     HDL Cholesterol: 64 mg/dL     Total Cholesterol: 189 mg/dL       Relevant Orders   Lipid panel     Other   Human immunodeficiency virus (HIV) disease (HCC)    Stable. Presently on Genvoya and managed by infectious diseases doctors.      Other Visit Diagnoses     Need for vaccination       Relevant Orders   Flu Vaccine QUAD 6+ mos PF IM (Fluarix Quad PF) (Completed)   Zoster Recombinant (Shingrix ) (Completed)   Chronic pain of both knees       Relevant Orders   Ambulatory referral to Sports Medicine   Lumbar back pain       Relevant Orders   Ambulatory referral to Sports Medicine      Patient Instructions  Diabetes Mellitus and Nutrition, Adult When you have diabetes, or diabetes mellitus, it is very important to have healthy eating habits because your blood sugar (glucose) levels are greatly affected by what you eat and drink. Eating healthy foods in the right amounts, at about the same times every day, can help you: Manage your blood glucose. Lower your risk of heart disease. Improve your blood pressure. Reach or maintain a healthy weight. What can affect my meal plan? Every person with diabetes is different, and each person has different needs for a  meal plan. Your health care provider may recommend that you work with a dietitian to make a meal plan that is best for you. Your meal plan may vary depending on factors such as: The calories you need. The medicines  you take. Your weight. Your blood glucose, blood pressure, and cholesterol levels. Your activity level. Other health conditions you have, such as heart or kidney disease. How do carbohydrates affect me? Carbohydrates, also called carbs, affect your blood glucose level more than any other type of food. Eating carbs raises the amount of glucose in your blood. It is important to know how many carbs you can safely have in each meal. This is different for every person. Your dietitian can help you calculate how many carbs you should have at each meal and for each snack. How does alcohol affect me? Alcohol can cause a decrease in blood glucose (hypoglycemia), especially if you use insulin or take certain diabetes medicines by mouth. Hypoglycemia can be a life-threatening condition. Symptoms of hypoglycemia, such as sleepiness, dizziness, and confusion, are similar to symptoms of having too much alcohol. Do not drink alcohol if: Your health care provider tells you not to drink. You are pregnant, may be pregnant, or are planning to become pregnant. If you drink alcohol: Limit how much you have to: 0-1 drink a day for women. 0-2 drinks a day for men. Know how much alcohol is in your drink. In the [place] one drink equals one 12 oz bottle of beer (355 mL), one 5 oz glass of wine (148 mL), or one 1 oz glass of hard liquor (44 mL). Keep yourself hydrated with water, diet soda, or unsweetened iced tea. Keep in mind that regular soda, juice, and other mixers may contain a lot of sugar and must be counted as carbs. What are tips for following this plan?  Reading food labels Start by checking the serving size on the Nutrition Facts label of packaged foods and drinks. The number of calories and the amount of carbs, fats, and other nutrients listed on the label are based on one serving of the item. Many items contain more than one serving per package. Check the total grams (g) of carbs in one serving. Check the  number of grams of saturated fats and trans fats in one serving. Choose foods that have a low amount or none of these fats. Check the number of milligrams (mg) of salt (sodium) in one serving. Most people should limit total sodium intake to less than 2,300 mg per day. Always check the nutrition information of foods labeled as "low-fat" or "nonfat." These foods may be higher in added sugar or refined carbs and should be avoided. Talk to your dietitian to identify your daily goals for nutrients listed on the label. Shopping Avoid buying canned, pre-made, or processed foods. These foods tend to be high in fat, sodium, and added sugar. Shop around the outside edge of the grocery store. This is where you will most often find fresh fruits and vegetables, bulk grains, fresh meats, and fresh dairy products. Cooking Use low-heat cooking methods, such as baking, instead of high-heat cooking methods, such as deep frying. Cook using healthy oils, such as olive, canola, or sunflower oil. Avoid cooking with butter, cream, or high-fat meats. Meal planning Eat meals and snacks regularly, preferably at the same times every day. Avoid going long periods of time without eating. Eat foods that are high in fiber, such as fresh fruits, vegetables, beans, and whole grains. Eat 4-6  oz (112-168 g) of lean protein each day, such as lean meat, chicken, fish, eggs, or tofu. One ounce (oz) (28 g) of lean protein is equal to: 1 oz (28 g) of meat, chicken, or fish. 1 egg.  cup (62 g) of tofu. Eat some foods each day that contain healthy fats, such as avocado, nuts, seeds, and fish. What foods should I eat? Fruits Berries. Apples. Oranges. Peaches. Apricots. Plums. Grapes. Mangoes. Papayas. Pomegranates. Kiwi. Cherries. Vegetables Leafy greens, including lettuce, spinach, kale, chard, collard greens, mustard greens, and cabbage. Beets. Cauliflower. Broccoli. Carrots. Green beans. Tomatoes. Peppers. Onions. Cucumbers.  Brussels sprouts. Grains Whole grains, such as whole-wheat or whole-grain bread, crackers, tortillas, cereal, and pasta. Unsweetened oatmeal. Quinoa. Brown or wild rice. Meats and other proteins Seafood. Poultry without skin. Lean cuts of poultry and beef. Tofu. Nuts. Seeds. Dairy Low-fat or fat-free dairy products such as milk, yogurt, and cheese. The items listed above may not be a complete list of foods and beverages you can eat and drink. Contact a dietitian for more information. What foods should I avoid? Fruits Fruits canned with syrup. Vegetables Canned vegetables. Frozen vegetables with butter or cream sauce. Grains Refined white flour and flour products such as bread, pasta, snack foods, and cereals. Avoid all processed foods. Meats and other proteins Fatty cuts of meat. Poultry with skin. Breaded or fried meats. Processed meat. Avoid saturated fats. Dairy Full-fat yogurt, cheese, or milk. Beverages Sweetened drinks, such as soda or iced tea. The items listed above may not be a complete list of foods and beverages you should avoid. Contact a dietitian for more information. Questions to ask a health care provider Do I need to meet with a certified diabetes care and education specialist? Do I need to meet with a dietitian? What number can I call if I have questions? When are the best times to check my blood glucose? Where to find more information: American Diabetes Association: diabetes.org Academy of Nutrition and Dietetics: eatright.[employer] of Diabetes and Digestive and Kidney Diseases: AmenCredit.is Association of Diabetes Care & Education Specialists: diabeteseducator.org Summary It is important to have healthy eating habits because your blood sugar (glucose) levels are greatly affected by what you eat and drink. It is important to use alcohol carefully. A healthy meal plan will help you manage your blood glucose and lower your risk of heart disease. Your  health care provider may recommend that you work with a dietitian to make a meal plan that is best for you. This information is not intended to replace advice given to you by your health care provider. Make sure you discuss any questions you have with your health care provider. Document Revised: 08/02/2019 Document Reviewed: 08/02/2019 Elsevier Patient Education  Dorris, MD [facility]

## 2021-09-04 NOTE — Assessment & Plan Note (Signed)
Stable. Presently on Genvoya and managed by infectious diseases doctors.

## 2021-09-04 NOTE — Assessment & Plan Note (Signed)
Well-controlled hypertension. Continue Zestoretic 10-12.5 mg daily and amlodipine 5 mg daily. Well-controlled diabetes with hemoglobin A1c of 5.8 Lab Results  Component Value Date   HGBA1C 5.8 (A) 09/04/2021  Continue weekly Trulicity 7.41 mg, Jardiance 10 mg daily and metformin 500 mg twice a day. Cardiovascular risks associated with hypertension and diabetes discussed. Diet and nutrition discussed. Follow-up in 6 months.

## 2021-09-04 NOTE — Assessment & Plan Note (Signed)
Stable.  Diet and nutrition discussed. Continue atorvastatin 20 mg daily. Lipid panel done today. The 10-year ASCVD risk score (Arnett DK, et al., 2019) is: 8.5%   Values used to calculate the score:     Age: 51 years     Sex: Female     Is Non-Hispanic African American: Yes     Diabetic: Yes     Tobacco smoker: No     Systolic Blood Pressure: 248 mmHg     Is BP treated: Yes     HDL Cholesterol: 64 mg/dL     Total Cholesterol: 189 mg/dL

## 2021-09-09 NOTE — Progress Notes (Unsigned)
Benito Mccreedy D.[place] Phone: [phone]   Assessment and Plan:     There are no diagnoses linked to this encounter.  ***   Pertinent previous records reviewed include ***   Follow Up: ***     Subjective:   I, Kimo Bancroft, am serving as a Education administrator for Doctor Glennon Mac  Chief Complaint: bilateral knee pain   HPI:   09/10/2021 Patient is a 51 year old female complaining of bilateral knee pain. Patient states  Relevant Historical Information: ***  Additional pertinent review of systems negative.   Current Outpatient Medications:    amLODipine (NORVASC) 5 MG tablet, Take 1 tablet (5 mg total) by mouth daily., Disp: 90 tablet, Rfl: 3   atorvastatin (LIPITOR) 20 MG tablet, Take 1 tablet (20 mg total) by mouth daily., Disp: 90 tablet, Rfl: 3   Dulaglutide (TRULICITY) 3.23 FT/7.3UK SOPN, Inject 0.75 mg into the skin once a week., Disp: 6 mL, Rfl: 3   elvitegravir-cobicistat-emtricitabine-tenofovir (GENVOYA) 150-150-200-10 MG TABS tablet, Take 1 tablet by mouth daily with breakfast., Disp: 90 tablet, Rfl: 3   fluconazole (DIFLUCAN) 100 MG tablet, Take 1 tablet (100 mg total) by mouth daily., Disp: 7 tablet, Rfl: 2   fluticasone (FLONASE) 50 MCG/ACT nasal spray, Place into the nose., Disp: , Rfl:    glucose monitoring kit (FREESTYLE) monitoring kit, 1 each by Does not apply route 2 (two) times daily at 8 am and 10 pm., Disp: 1 each, Rfl: 0   JARDIANCE 10 MG TABS tablet, tablet, Disp: , Rfl:    lisinopril-hydrochlorothiazide (ZESTORETIC) 10-12.5 MG tablet, Take 1 tablet by mouth daily., Disp: 90 tablet, Rfl: 3   metFORMIN (GLUCOPHAGE) 500 MG tablet, Take 1 tablet (500 mg total) by mouth 2 (two) times daily with a meal., Disp: 180 tablet, Rfl: 3   ONE TOUCH ULTRA TEST test strip, Reported on 06/26/2015, Disp: , Rfl: 3   prednisoLONE acetate (PRED FORTE) 1 % ophthalmic suspension, Place 1 drop into  both eyes daily., Disp: 10 mL, Rfl: 0   Objective:     There were no vitals filed for this visit.    There is no height or weight on file to calculate BMI.    Physical Exam:    ***   Electronically signed by:  Benito Mccreedy D.[facility] 2:16 PM 09/09/21

## 2021-09-10 ENCOUNTER — Ambulatory Visit (INDEPENDENT_AMBULATORY_CARE_PROVIDER_SITE_OTHER): Payer: Commercial Managed Care - HMO | Admitting: Sports Medicine

## 2021-09-10 ENCOUNTER — Ambulatory Visit (INDEPENDENT_AMBULATORY_CARE_PROVIDER_SITE_OTHER): Payer: Commercial Managed Care - HMO

## 2021-09-10 VITALS — BP 140/84 | HR 84 | Ht 66.0 in | Wt 188.0 lb

## 2021-09-10 DIAGNOSIS — M545 Low back pain, unspecified: Secondary | ICD-10-CM

## 2021-09-10 DIAGNOSIS — M1712 Unilateral primary osteoarthritis, left knee: Secondary | ICD-10-CM | POA: Diagnosis not present

## 2021-09-10 DIAGNOSIS — M25562 Pain in left knee: Secondary | ICD-10-CM | POA: Diagnosis not present

## 2021-09-10 DIAGNOSIS — M25561 Pain in right knee: Secondary | ICD-10-CM | POA: Diagnosis not present

## 2021-09-10 DIAGNOSIS — M1711 Unilateral primary osteoarthritis, right knee: Secondary | ICD-10-CM

## 2021-09-10 DIAGNOSIS — M5136 Other intervertebral disc degeneration, lumbar region: Secondary | ICD-10-CM

## 2021-09-10 DIAGNOSIS — G8929 Other chronic pain: Secondary | ICD-10-CM | POA: Diagnosis not present

## 2021-09-10 NOTE — Patient Instructions (Addendum)
Good to see you  Low back and knee HEP 3-4 week follow up

## 2021-09-12 ENCOUNTER — Other Ambulatory Visit (HOSPITAL_COMMUNITY): Payer: Self-pay

## 2021-09-12 ENCOUNTER — Other Ambulatory Visit: Payer: Self-pay | Admitting: Emergency Medicine

## 2021-09-12 ENCOUNTER — Other Ambulatory Visit: Payer: Self-pay | Admitting: Infectious Diseases

## 2021-09-12 DIAGNOSIS — I10 Essential (primary) hypertension: Secondary | ICD-10-CM

## 2021-09-12 DIAGNOSIS — E1165 Type 2 diabetes mellitus with hyperglycemia: Secondary | ICD-10-CM

## 2021-09-12 DIAGNOSIS — N760 Acute vaginitis: Secondary | ICD-10-CM

## 2021-09-12 DIAGNOSIS — E785 Hyperlipidemia, unspecified: Secondary | ICD-10-CM

## 2021-09-12 MED ORDER — METFORMIN HCL 500 MG PO TABS
500.0000 mg | ORAL_TABLET | Freq: Two times a day (BID) | ORAL | 3 refills | Status: DC
  Filled 2021-09-12: qty 180, 90d supply, fill #0
  Filled 2021-11-29: qty 180, 90d supply, fill #1
  Filled 2022-02-28: qty 180, 90d supply, fill #2
  Filled 2022-05-21: qty 180, 90d supply, fill #3

## 2021-09-12 MED ORDER — LISINOPRIL-HYDROCHLOROTHIAZIDE 10-12.5 MG PO TABS
1.0000 | ORAL_TABLET | Freq: Every day | ORAL | 3 refills | Status: DC
  Filled 2021-09-12: qty 90, 90d supply, fill #0
  Filled 2021-11-29: qty 90, 90d supply, fill #1
  Filled 2022-02-28: qty 90, 90d supply, fill #2
  Filled 2022-05-21: qty 90, 90d supply, fill #3

## 2021-09-12 MED ORDER — AMLODIPINE BESYLATE 5 MG PO TABS
5.0000 mg | ORAL_TABLET | Freq: Every day | ORAL | 3 refills | Status: DC
  Filled 2021-09-12: qty 90, 90d supply, fill #0
  Filled 2021-11-29: qty 90, 90d supply, fill #1
  Filled 2022-02-28: qty 90, 90d supply, fill #2
  Filled 2022-05-21: qty 90, 90d supply, fill #3

## 2021-09-12 MED ORDER — EMPAGLIFLOZIN 10 MG PO TABS
10.0000 mg | ORAL_TABLET | Freq: Every day | ORAL | 1 refills | Status: DC
  Filled 2021-09-12: qty 90, 90d supply, fill #0

## 2021-09-12 MED ORDER — ATORVASTATIN CALCIUM 20 MG PO TABS
20.0000 mg | ORAL_TABLET | Freq: Every day | ORAL | 3 refills | Status: DC
  Filled 2021-09-12: qty 90, 90d supply, fill #0
  Filled 2021-11-29: qty 90, 90d supply, fill #1
  Filled 2022-02-28: qty 90, 90d supply, fill #2
  Filled 2022-05-21: qty 90, 90d supply, fill #3

## 2021-09-15 ENCOUNTER — Other Ambulatory Visit (HOSPITAL_COMMUNITY): Payer: Self-pay

## 2021-09-15 ENCOUNTER — Other Ambulatory Visit: Payer: Self-pay | Admitting: Emergency Medicine

## 2021-09-15 MED ORDER — DAPAGLIFLOZIN PROPANEDIOL 10 MG PO TABS
10.0000 mg | ORAL_TABLET | Freq: Every day | ORAL | 1 refills | Status: AC
Start: 2021-09-15 — End: 2022-03-14
  Filled 2021-09-15: qty 90, 90d supply, fill #0

## 2021-09-15 NOTE — Telephone Encounter (Signed)
New prescription for Farxiga 10 mg sent.

## 2021-09-16 ENCOUNTER — Other Ambulatory Visit (HOSPITAL_COMMUNITY): Payer: Self-pay

## 2021-09-18 ENCOUNTER — Other Ambulatory Visit (HOSPITAL_COMMUNITY): Payer: Self-pay

## 2021-10-06 ENCOUNTER — Other Ambulatory Visit (HOSPITAL_COMMUNITY): Payer: Self-pay

## 2021-10-06 ENCOUNTER — Other Ambulatory Visit: Payer: Self-pay | Admitting: Emergency Medicine

## 2021-10-06 DIAGNOSIS — E1165 Type 2 diabetes mellitus with hyperglycemia: Secondary | ICD-10-CM

## 2021-10-07 NOTE — Progress Notes (Unsigned)
   I, Peterson Lombard, LAT, ATC acting as a scribe for Katrina Leader, MD.  Katrina Parker is a 51 y.o. female who presents to [place] at [facility] today for f/u bilat knee pain and DDD of the lumbar spine. Pt was last seen by Dr. Glennon Mac on 09/10/21 and was given bilat knee steroid injections and was taught HEP and was advised to use Tylenol. Today, pt reports  Dx imaging: 09/10/21 L-spine, R & L knee XR  04/11/08 L-spine MRI  Pertinent review of systems: ***  Relevant historical information: ***   Exam:  LMP 06/01/2011  General: Well Developed, well nourished, and in no acute distress.   MSK: ***    Lab and Radiology Results No results found for this or any previous visit (from the past 72 hour(s)). No results found.     Assessment and Plan: 51 y.o. female with ***   PDMP not reviewed this encounter. No orders of the defined types were placed in this encounter.  No orders of the defined types were placed in this encounter.    Discussed warning signs or symptoms. Please see discharge instructions. Patient expresses understanding.   ***

## 2021-10-08 ENCOUNTER — Ambulatory Visit (INDEPENDENT_AMBULATORY_CARE_PROVIDER_SITE_OTHER): Payer: Commercial Managed Care - HMO | Admitting: Family Medicine

## 2021-10-08 ENCOUNTER — Other Ambulatory Visit (HOSPITAL_COMMUNITY): Payer: Self-pay

## 2021-10-08 VITALS — BP 152/88 | HR 89 | Ht 66.0 in | Wt 189.0 lb

## 2021-10-08 DIAGNOSIS — M170 Bilateral primary osteoarthritis of knee: Secondary | ICD-10-CM | POA: Diagnosis not present

## 2021-10-08 DIAGNOSIS — M545 Low back pain, unspecified: Secondary | ICD-10-CM | POA: Diagnosis not present

## 2021-10-08 DIAGNOSIS — G8929 Other chronic pain: Secondary | ICD-10-CM

## 2021-10-08 DIAGNOSIS — M25562 Pain in left knee: Secondary | ICD-10-CM

## 2021-10-08 DIAGNOSIS — M25561 Pain in right knee: Secondary | ICD-10-CM | POA: Diagnosis not present

## 2021-10-08 NOTE — Patient Instructions (Signed)
Thank you for coming in today.   We will work on getting approval for those gel shots.   You should hear from my office within 1-2 weeks.   If nobody calls please let me know so I can fix it.   Please use Voltaren gel (Generic Diclofenac Gel) up to 4x daily for pain as needed.  This is available over-the-counter as both the name brand Voltaren gel and the generic diclofenac gel.

## 2021-10-11 ENCOUNTER — Other Ambulatory Visit (HOSPITAL_COMMUNITY): Payer: Self-pay

## 2021-11-07 ENCOUNTER — Other Ambulatory Visit (HOSPITAL_COMMUNITY): Payer: Self-pay

## 2021-11-08 ENCOUNTER — Other Ambulatory Visit (HOSPITAL_COMMUNITY): Payer: Self-pay

## 2021-11-27 ENCOUNTER — Other Ambulatory Visit (HOSPITAL_COMMUNITY): Payer: Self-pay

## 2021-11-29 ENCOUNTER — Other Ambulatory Visit: Payer: Self-pay | Admitting: Emergency Medicine

## 2021-11-29 ENCOUNTER — Other Ambulatory Visit (HOSPITAL_COMMUNITY): Payer: Self-pay

## 2021-11-29 DIAGNOSIS — E1165 Type 2 diabetes mellitus with hyperglycemia: Secondary | ICD-10-CM

## 2021-11-30 ENCOUNTER — Other Ambulatory Visit (HOSPITAL_COMMUNITY): Payer: Self-pay

## 2021-11-30 MED ORDER — TRULICITY 0.75 MG/0.5ML ~~LOC~~ SOAJ
0.7500 mg | SUBCUTANEOUS | 3 refills | Status: DC
  Filled 2021-11-30: qty 6, 84d supply, fill #0
  Filled 2022-02-28: qty 6, 84d supply, fill #1
  Filled 2022-05-21: qty 6, 84d supply, fill #2

## 2021-12-01 ENCOUNTER — Other Ambulatory Visit (HOSPITAL_COMMUNITY): Payer: Self-pay

## 2021-12-03 ENCOUNTER — Other Ambulatory Visit (HOSPITAL_COMMUNITY): Payer: Self-pay

## 2021-12-05 ENCOUNTER — Other Ambulatory Visit (HOSPITAL_COMMUNITY): Payer: Self-pay

## 2021-12-08 ENCOUNTER — Ambulatory Visit (INDEPENDENT_AMBULATORY_CARE_PROVIDER_SITE_OTHER): Payer: Commercial Managed Care - HMO | Admitting: Emergency Medicine

## 2021-12-08 ENCOUNTER — Encounter: Payer: Self-pay | Admitting: Emergency Medicine

## 2021-12-08 VITALS — BP 130/70 | HR 86 | Temp 98.1°F | Ht 66.0 in | Wt 194.1 lb

## 2021-12-08 DIAGNOSIS — E1165 Type 2 diabetes mellitus with hyperglycemia: Secondary | ICD-10-CM

## 2021-12-08 DIAGNOSIS — E785 Hyperlipidemia, unspecified: Secondary | ICD-10-CM

## 2021-12-08 DIAGNOSIS — E1159 Type 2 diabetes mellitus with other circulatory complications: Secondary | ICD-10-CM

## 2021-12-08 DIAGNOSIS — I152 Hypertension secondary to endocrine disorders: Secondary | ICD-10-CM | POA: Diagnosis not present

## 2021-12-08 DIAGNOSIS — Z1211 Encounter for screening for malignant neoplasm of colon: Secondary | ICD-10-CM

## 2021-12-08 DIAGNOSIS — E1169 Type 2 diabetes mellitus with other specified complication: Secondary | ICD-10-CM

## 2021-12-08 DIAGNOSIS — B20 Human immunodeficiency virus [HIV] disease: Secondary | ICD-10-CM

## 2021-12-08 DIAGNOSIS — Z1231 Encounter for screening mammogram for malignant neoplasm of breast: Secondary | ICD-10-CM

## 2021-12-08 LAB — POCT GLYCOSYLATED HEMOGLOBIN (HGB A1C): Hemoglobin A1C: 6 % — AB (ref 4.0–5.6)

## 2021-12-08 LAB — LIPID PANEL
Cholesterol: 137 mg/dL (ref 0–200)
HDL: 55.1 mg/dL (ref >39.00)
LDL Cholesterol: 68 mg/dL (ref 0–99)
NonHDL: 81.41
Total CHOL/HDL Ratio: 2
Triglycerides: 68 mg/dL (ref 0.0–149.0)
VLDL: 13.6 mg/dL (ref 0.0–40.0)

## 2021-12-08 LAB — CBC WITH DIFFERENTIAL/PLATELET
Basophils Absolute: 0 10*3/uL (ref 0.0–0.1)
Basophils Relative: 0.3 % (ref 0.0–3.0)
Eosinophils Absolute: 0.2 10*3/uL (ref 0.0–0.7)
Eosinophils Relative: 4.5 % (ref 0.0–5.0)
HCT: 35.2 % — ABNORMAL LOW (ref 36.0–46.0)
Hemoglobin: 11.6 g/dL — ABNORMAL LOW (ref 12.0–15.0)
Lymphocytes Relative: 36.3 % (ref 12.0–46.0)
Lymphs Abs: 1.3 10*3/uL (ref 0.7–4.0)
MCHC: 33.1 g/dL (ref 30.0–36.0)
MCV: 87.9 fl (ref 78.0–100.0)
Monocytes Absolute: 0.4 10*3/uL (ref 0.1–1.0)
Monocytes Relative: 11.2 % (ref 3.0–12.0)
Neutro Abs: 1.7 10*3/uL (ref 1.4–7.7)
Neutrophils Relative %: 47.7 % (ref 43.0–77.0)
Platelets: 281 10*3/uL (ref 150.0–400.0)
RBC: 4.01 Mil/uL (ref 3.87–5.11)
RDW: 14 % (ref 11.5–15.5)
WBC: 3.5 10*3/uL — ABNORMAL LOW (ref 4.0–10.5)

## 2021-12-08 LAB — COMPREHENSIVE METABOLIC PANEL
ALT: 22 U/L (ref 0–35)
AST: 22 U/L (ref 0–37)
Albumin: 4 g/dL (ref 3.5–5.2)
Alkaline Phosphatase: 78 U/L (ref 39–117)
BUN: 16 mg/dL (ref 6–23)
CO2: 31 mEq/L (ref 19–32)
Calcium: 9.4 mg/dL (ref 8.4–10.5)
Chloride: 99 mEq/L (ref 96–112)
Creatinine, Ser: 0.67 mg/dL (ref 0.40–1.20)
GFR: 99.63 mL/min (ref >60.00)
Glucose, Bld: 120 mg/dL — ABNORMAL HIGH (ref 70–99)
Potassium: 4 mEq/L (ref 3.5–5.1)
Sodium: 135 mEq/L (ref 135–145)
Total Bilirubin: 0.8 mg/dL (ref 0.2–1.2)
Total Protein: 7.6 g/dL (ref 6.0–8.3)

## 2021-12-08 LAB — MICROALBUMIN / CREATININE URINE RATIO
Creatinine,U: 46.1 mg/dL
Microalb Creat Ratio: 1.5 mg/g (ref 0.0–30.0)
Microalb, Ur: <0.7 mg/dL (ref 0.0–1.9)

## 2021-12-08 NOTE — Assessment & Plan Note (Signed)
Stable.  No recent infections. Continue Genvoya as prescribed by infectious disease doctor.

## 2021-12-08 NOTE — Patient Instructions (Signed)
Hypertension, Adult High blood pressure (hypertension) is when the force of blood pumping through the arteries is too strong. The arteries are the blood vessels that carry blood from the heart throughout the body. Hypertension forces the heart to work harder to pump blood and may cause arteries to become narrow or stiff. Untreated or uncontrolled hypertension can lead to a heart attack, heart failure, a stroke, kidney disease, and other problems. A blood pressure reading consists of a higher number over a lower number. Ideally, your blood pressure should be below 120/80. The first ("top") number is called the systolic pressure. It is a measure of the pressure in your arteries as your heart beats. The second ("bottom") number is called the diastolic pressure. It is a measure of the pressure in your arteries as the heart relaxes. What are the causes? The exact cause of this condition is not known. There are some conditions that result in high blood pressure. What increases the risk? Certain factors may make you more likely to develop high blood pressure. Some of these risk factors are under your control, including: Smoking. Not getting enough exercise or physical activity. Being overweight. Having too much fat, sugar, calories, or salt (sodium) in your diet. Drinking too much alcohol. Other risk factors include: Having a personal history of heart disease, diabetes, high cholesterol, or kidney disease. Stress. Having a family history of high blood pressure and high cholesterol. Having obstructive sleep apnea. Age. The risk increases with age. What are the signs or symptoms? High blood pressure may not cause symptoms. Very high blood pressure (hypertensive crisis) may cause: Headache. Fast or irregular heartbeats (palpitations). Shortness of breath. Nosebleed. Nausea and vomiting. Vision changes. Severe chest pain, dizziness, and seizures. How is this diagnosed? This condition is diagnosed by  measuring your blood pressure while you are seated, with your arm resting on a flat surface, your legs uncrossed, and your feet flat on the floor. The cuff of the blood pressure monitor will be placed directly against the skin of your upper arm at the level of your heart. Blood pressure should be measured at least twice using the same arm. Certain conditions can cause a difference in blood pressure between your right and left arms. If you have a high blood pressure reading during one visit or you have normal blood pressure with other risk factors, you may be asked to: Return on a different day to have your blood pressure checked again. Monitor your blood pressure at home for 1 week or longer. If you are diagnosed with hypertension, you may have other blood or imaging tests to help your health care provider understand your overall risk for other conditions. How is this treated? This condition is treated by making healthy lifestyle changes, such as eating healthy foods, exercising more, and reducing your alcohol intake. You may be referred for counseling on a healthy diet and physical activity. Your health care provider may prescribe medicine if lifestyle changes are not enough to get your blood pressure under control and if: Your systolic blood pressure is above 130. Your diastolic blood pressure is above 80. Your personal target blood pressure may vary depending on your medical conditions, your age, and other factors. Follow these instructions at home: Eating and drinking  Eat a diet that is high in fiber and potassium, and low in sodium, added sugar, and fat. An example of this eating plan is called the DASH diet. DASH stands for Dietary Approaches to Stop Hypertension. To eat this way: Eat   plenty of fresh fruits and vegetables. Try to fill one half of your plate at each meal with fruits and vegetables. Eat whole grains, such as whole-wheat pasta, brown rice, or whole-grain bread. Fill about one  fourth of your plate with whole grains. Eat or drink low-fat dairy products, such as skim milk or low-fat yogurt. Avoid fatty cuts of meat, processed or cured meats, and poultry with skin. Fill about one fourth of your plate with lean proteins, such as fish, chicken without skin, beans, eggs, or tofu. Avoid pre-made and processed foods. These tend to be higher in sodium, added sugar, and fat. Reduce your daily sodium intake. Many people with hypertension should eat less than 1,500 mg of sodium a day. Do not drink alcohol if: Your health care provider tells you not to drink. You are pregnant, may be pregnant, or are planning to become pregnant. If you drink alcohol: Limit how much you have to: 0-1 drink a day for women. 0-2 drinks a day for men. Know how much alcohol is in your drink. In the [place] one drink equals one 12 oz bottle of beer (355 mL), one 5 oz glass of wine (148 mL), or one 1 oz glass of hard liquor (44 mL). Lifestyle  Work with your health care provider to maintain a healthy body weight or to lose weight. Ask what an ideal weight is for you. Get at least 30 minutes of exercise that causes your heart to beat faster (aerobic exercise) most days of the week. Activities may include walking, swimming, or biking. Include exercise to strengthen your muscles (resistance exercise), such as Pilates or lifting weights, as part of your weekly exercise routine. Try to do these types of exercises for 30 minutes at least 3 days a week. Do not use any products that contain nicotine or tobacco. These products include cigarettes, chewing tobacco, and vaping devices, such as e-cigarettes. If you need help quitting, ask your health care provider. Monitor your blood pressure at home as told by your health care provider. Keep all follow-up visits. This is important. Medicines Take over-the-counter and prescription medicines only as told by your health care provider. Follow directions carefully. Blood  pressure medicines must be taken as prescribed. Do not skip doses of blood pressure medicine. Doing this puts you at risk for problems and can make the medicine less effective. Ask your health care provider about side effects or reactions to medicines that you should watch for. Contact a health care provider if you: Think you are having a reaction to a medicine you are taking. Have headaches that keep coming back (recurring). Feel dizzy. Have swelling in your ankles. Have trouble with your vision. Get help right away if you: Develop a severe headache or confusion. Have unusual weakness or numbness. Feel faint. Have severe pain in your chest or abdomen. Vomit repeatedly. Have trouble breathing. These symptoms may be an emergency. Get help right away. Call 911. Do not wait to see if the symptoms will go away. Do not drive yourself to the hospital. Summary Hypertension is when the force of blood pumping through your arteries is too strong. If this condition is not controlled, it may put you at risk for serious complications. Your personal target blood pressure may vary depending on your medical conditions, your age, and other factors. For most people, a normal blood pressure is less than 120/80. Hypertension is treated with lifestyle changes, medicines, or a combination of both. Lifestyle changes include losing weight, eating a healthy,   low-sodium diet, exercising more, and limiting alcohol. This information is not intended to replace advice given to you by your health care provider. Make sure you discuss any questions you have with your health care provider. Document Revised: 11/05/2020 Document Reviewed: 11/05/2020 [employer] Patient Education  2023 [employer]  

## 2021-12-08 NOTE — Assessment & Plan Note (Signed)
Stable.  Diet and nutrition discussed.  Lipid profile done today. Continue atorvastatin 20 mg daily. The 10-year ASCVD risk score (Arnett DK, et al., 2019) is: 12.2%   Values used to calculate the score:     Age: 51 years     Sex: Female     Is Non-Hispanic African American: Yes     Diabetic: Yes     Tobacco smoker: No     Systolic Blood Pressure: 427 mmHg     Is BP treated: Yes     HDL Cholesterol: 64 mg/dL     Total Cholesterol: 189 mg/dL

## 2021-12-08 NOTE — Assessment & Plan Note (Addendum)
Did not take blood pressure medications this morning. Normal blood pressure readings at home. Continue Zestoretic 10-12.5 mg and amlodipine 5 mg daily Well-controlled diabetes with hemoglobin A1c of 6.0. Continue weekly Trulicity 2.86 mg and daily Farxiga 10 mg with metformin 500 mg twice a day Cardiovascular risks associated with hypertension and diabetes discussed. Diet and nutrition discussed. Follow-up in 6 months.

## 2021-12-08 NOTE — Progress Notes (Signed)
Katrina Parker 51 y.o.   Chief Complaint  Patient presents with   Follow-up    4mth f/u appt, no concerns     HISTORY OF PRESENT ILLNESS: This is a 51y.o. female here for 338-monthollow-up of diabetes hypertension and dyslipidemia. Doing well.  Has no complaints or medical concerns today.  HPI   Prior to Admission medications   Medication Sig Start Date End Date Taking? Authorizing Provider  dapagliflozin propanediol (FARXIGA) 10 MG TABS tablet Take 1 tablet (10 mg total) by mouth daily before breakfast. 09/15/21 03/14/22  SaHorald PollenMD  amLODipine (NORVASC) 5 MG tablet Take 1 tablet (5 mg total) by mouth daily. 09/12/21   SaHorald PollenMD  atorvastatin (LIPITOR) 20 MG tablet Take 1 tablet (20 mg total) by mouth daily. 09/12/21   SaHorald PollenMD  Dulaglutide (TRULICITY) 0.2.09GOB/0.9GGOPN Inject 0.75 mg into the skin once a week. 11/30/21   SaHorald PollenMD  elvitegravir-cobicistat-emtricitabine-tenofovir (GENVOYA) 150-150-200-10 MG TABS tablet Take 1 tablet by mouth daily with breakfast. 03/13/15   HaCampbell RichesMD  fluconazole (DIFLUCAN) 100 MG tablet Take 1 tablet (100 mg total) by mouth daily. 01/22/16   HaCampbell RichesMD  fluticasone (FLONASE) 50 MCG/ACT nasal spray Place into the nose. 10/20/17   [provider]  glucose monitoring kit (FREESTYLE) monitoring kit 1 each by Does not apply route 2 (two) times daily at 8 am and 10 pm. 09/14/13   HoDorena DewFNP  lisinopril-hydrochlorothiazide (ZESTORETIC) 10-12.5 MG tablet Take 1 tablet by mouth daily. 09/12/21   SaHorald PollenMD  metFORMIN (GLUCOPHAGE) 500 MG tablet Take 1 tablet (500 mg total) by mouth 2 (two) times daily with a meal. 09/12/21   SaHorald PollenMD  ONE TOUCH ULTRA TEST test strip Reported on 06/26/2015 11/03/13   [provider]  prednisoLONE acetate (PRED FORTE) 1 % ophthalmic suspension Place 1 drop into both eyes daily. 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   Hypertension associated with diabetes ([facility]09/29/2022   Visual problems 10/10/2020   Vision changes 10/20/2017   Seasonal allergic rhinitis due to pollen 10/20/2017   Diabetes type 2, uncontrolled 09/13/2013   Obesity 09/13/2013   History of uterine fibroid 09/13/2013   Essential hypertension, benign 02/22/2012   Chronic right hip pain 02/22/2012   Post herpetic neuralgia 05/20/2010   ABDOMINAL WALL HERNIA 12/24/2006   Human immunodeficiency virus (HIV) disease ([facility]09/17/2008    Past Medical History:  Diagnosis Date   Diabetes mellitus without complication ([facility]   HIV [place] immunodeficiency virus infection) ([facility]    Past Surgical History:  Procedure Laterality Date   OTHER SURGICAL HISTORY     [place] states there might have been a "tumor")    Social History   Socioeconomic History   Marital status: Widowed    Spouse name: Not on file   Number of children: Not on file   Years of education: Not on file   Highest education level: Not on file  Occupational History   Not on file  Tobacco Use   Smoking status: Never   Smokeless tobacco: Never  Substance and Sexual Activity   Alcohol use: No    Alcohol/week: 0.0 standard drinks of alcohol   Drug use: No   Sexual activity: Not Currently    Partners: Male    Comment: declined condoms  Other Topics Concern   Not on file  Social History Narrative   Not on file   Social Determinants of Health   Financial Resource Strain: Not on file  Food Insecurity: Not on file  Transportation Needs: Not on file  Physical Activity: Not on file  Stress: Not on file  Social Connections: Not on file  Intimate Partner  Violence: Not on file    Family History  Problem Relation Age of Onset   Diabetes Neg Hx    Heart disease Neg Hx    Cancer Neg Hx      Review of Systems  Constitutional: Negative.  Negative for chills and fever.  HENT: Negative.  Negative for congestion and sore throat.   Respiratory: Negative.  Negative for cough and shortness of breath.   Cardiovascular: Negative.  Negative for chest pain and palpitations.  Gastrointestinal:  Negative for abdominal pain, diarrhea, nausea and vomiting.  Genitourinary: Negative.  Negative for dysuria and hematuria.  Skin: Negative.  Negative for rash.  Neurological: Negative.  Negative for dizziness and headaches.  All other systems reviewed and are negative.   Today's Vitals   12/08/21 0907  BP: (!) 148/76  Pulse: 86  Temp: 98.1 F (36.7 C)  TempSrc: Oral  SpO2: 97%  Weight: 194 lb 2 oz (88.1 kg)  Height: _0  (1.676 m)   Body mass index is 31.33 kg/m. Wt Readings from Last 3 Encounters:  12/08/21 194 lb 2 oz (88.1 kg)  10/08/21 189 lb (85.7 kg)  09/10/21 188 lb (85.3 kg)    Physical Exam Vitals reviewed.  Constitutional:      Appearance: Normal appearance.  HENT:     Head: Normocephalic.  Eyes:     Extraocular Movements: Extraocular movements intact.     Pupils: Pupils are equal, round, and reactive to light.  Cardiovascular:     Rate and Rhythm: Normal rate and regular rhythm.     Pulses: Normal pulses.     Heart sounds: Normal heart sounds.  Pulmonary:     Effort: Pulmonary effort is normal.     Breath sounds: Normal breath sounds.  Musculoskeletal:        General: Normal range of motion.  Skin:    General: Skin is warm and dry.  Neurological:     General: No focal deficit present.     Mental Status: She is alert and oriented to person, place, and time.  Psychiatric:        Mood and Affect: Mood normal.        Behavior: Behavior normal.    Results for orders placed or performed in visit on 12/08/21 (from the  past 24 hour(s))  POCT HgB A1C     Status: Abnormal   Collection Time: 12/08/21  9:20 AM  Result Value Ref Range   Hemoglobin A1C 6.0 (A) 4.0 - 5.6 %   HbA1c POC (<> result, manual entry)     HbA1c, POC (prediabetic range)     HbA1c, POC (controlled diabetic range)       ASSESSMENT & PLAN: A total of 47 minutes was spent with the patient and counseling/coordination of care regarding preparing for this visit, review of most recent office visit notes, review of most recent blood work  results including interpretation of today's hemoglobin A1c, review of multiple chronic medical conditions and their management, review of all medications, education on nutrition, cardiovascular risks associated with hypertension and diabetes, prognosis, documentation, and need for follow-up.  Problem List Items Addressed This Visit       Cardiovascular and Mediastinum   Hypertension associated with diabetes ([place]) - Primary    Did not take blood pressure medications this morning. Normal blood pressure readings at home. Continue Zestoretic 10-12.5 mg and amlodipine 5 mg daily Well-controlled diabetes with hemoglobin A1c of 6.0. Continue weekly Trulicity 0.53 mg and daily Farxiga 10 mg with metformin 500 mg twice a day Cardiovascular risks associated with hypertension and diabetes discussed. Diet and nutrition discussed. Follow-up in 6 months.      Relevant Orders   Urine Microalbumin w/creat. ratio   Comprehensive metabolic panel   CBC with Differential/Platelet     Endocrine   Diabetes type 2, uncontrolled   Relevant Orders   POCT HgB A1C (Completed)   Dyslipidemia associated with type 2 diabetes mellitus ([place])    Stable.  Diet and nutrition discussed.  Lipid profile done today. Continue atorvastatin 20 mg daily. The 10-year ASCVD risk score (Arnett DK, et al., 2019) is: 12.2%   Values used to calculate the score:     Age: 70 years     Sex: Female     Is Non-Hispanic African American: Yes      Diabetic: Yes     Tobacco smoker: No     Systolic Blood Pressure: 976 mmHg     Is BP treated: Yes     HDL Cholesterol: 64 mg/dL     Total Cholesterol: 189 mg/dL       Relevant Orders   Lipid panel   CBC with Differential/Platelet     Other   Human immunodeficiency virus (HIV) disease (HCC)    Stable.  No recent infections. Continue Genvoya as prescribed by infectious disease doctor.      Other Visit Diagnoses     Colon cancer screening       Relevant Orders   Ambulatory referral to Gastroenterology      Patient Instructions  Hypertension, Adult High blood pressure (hypertension) is when the force of blood pumping through the arteries is too strong. The arteries are the blood vessels that carry blood from the heart throughout the body. Hypertension forces the heart to work harder to pump blood and may cause arteries to become narrow or stiff. Untreated or uncontrolled hypertension can lead to a heart attack, heart failure, a stroke, kidney disease, and other problems. A blood pressure reading consists of a higher number over a lower number. Ideally, your blood pressure should be below 120/80. The first ("top") number is called the systolic pressure. It is a measure of the pressure in your arteries as your heart beats. The second ("bottom") number is called the diastolic pressure. It is a measure of the pressure in your arteries as the heart relaxes. What are the causes? The exact cause of this condition is not known. There are some conditions that result in high blood pressure. What increases the risk? Certain factors may make you more likely to develop high blood pressure. Some of these risk factors are under your control, including: Smoking. Not getting enough exercise or physical activity. Being overweight. Having too much fat, sugar, calories, or salt (sodium) in your diet. Drinking too much alcohol. Other risk factors include: Having a personal history of heart disease,  diabetes, high  cholesterol, or kidney disease. Stress. Having a family history of high blood pressure and high cholesterol. Having obstructive sleep apnea. Age. The risk increases with age. What are the signs or symptoms? High blood pressure may not cause symptoms. Very high blood pressure (hypertensive crisis) may cause: Headache. Fast or irregular heartbeats (palpitations). Shortness of breath. Nosebleed. Nausea and vomiting. Vision changes. Severe chest pain, dizziness, and seizures. How is this diagnosed? This condition is diagnosed by measuring your blood pressure while you are seated, with your arm resting on a flat surface, your legs uncrossed, and your feet flat on the floor. The cuff of the blood pressure monitor will be placed directly against the skin of your upper arm at the level of your heart. Blood pressure should be measured at least twice using the same arm. Certain conditions can cause a difference in blood pressure between your right and left arms. If you have a high blood pressure reading during one visit or you have normal blood pressure with other risk factors, you may be asked to: Return on a different day to have your blood pressure checked again. Monitor your blood pressure at home for 1 week or longer. If you are diagnosed with hypertension, you may have other blood or imaging tests to help your health care provider understand your overall risk for other conditions. How is this treated? This condition is treated by making healthy lifestyle changes, such as eating healthy foods, exercising more, and reducing your alcohol intake. You may be referred for counseling on a healthy diet and physical activity. Your health care provider may prescribe medicine if lifestyle changes are not enough to get your blood pressure under control and if: Your systolic blood pressure is above 130. Your diastolic blood pressure is above 80. Your personal target blood pressure may vary  depending on your medical conditions, your age, and other factors. Follow these instructions at home: Eating and drinking  Eat a diet that is high in fiber and potassium, and low in sodium, added sugar, and fat. An example of this eating plan is called the DASH diet. DASH stands for Dietary Approaches to Stop Hypertension. To eat this way: Eat plenty of fresh fruits and vegetables. Try to fill one half of your plate at each meal with fruits and vegetables. Eat whole grains, such as whole-wheat pasta, brown rice, or whole-grain bread. Fill about one fourth of your plate with whole grains. Eat or drink low-fat dairy products, such as skim milk or low-fat yogurt. Avoid fatty cuts of meat, processed or cured meats, and poultry with skin. Fill about one fourth of your plate with lean proteins, such as fish, chicken without skin, beans, eggs, or tofu. Avoid pre-made and processed foods. These tend to be higher in sodium, added sugar, and fat. Reduce your daily sodium intake. Many people with hypertension should eat less than 1,500 mg of sodium a day. Do not drink alcohol if: Your health care provider tells you not to drink. You are pregnant, may be pregnant, or are planning to become pregnant. If you drink alcohol: Limit how much you have to: 0-1 drink a day for women. 0-2 drinks a day for men. Know how much alcohol is in your drink. In the [place] one drink equals one 12 oz bottle of beer (355 mL), one 5 oz glass of wine (148 mL), or one 1 oz glass of hard liquor (44 mL). Lifestyle  Work with your health care provider to maintain a healthy body weight or  to lose weight. Ask what an ideal weight is for you. Get at least 30 minutes of exercise that causes your heart to beat faster (aerobic exercise) most days of the week. Activities may include walking, swimming, or biking. Include exercise to strengthen your muscles (resistance exercise), such as Pilates or lifting weights, as part of your weekly  exercise routine. Try to do these types of exercises for 30 minutes at least 3 days a week. Do not use any products that contain nicotine or tobacco. These products include cigarettes, chewing tobacco, and vaping devices, such as e-cigarettes. If you need help quitting, ask your health care provider. Monitor your blood pressure at home as told by your health care provider. Keep all follow-up visits. This is important. Medicines Take over-the-counter and prescription medicines only as told by your health care provider. Follow directions carefully. Blood pressure medicines must be taken as prescribed. Do not skip doses of blood pressure medicine. Doing this puts you at risk for problems and can make the medicine less effective. Ask your health care provider about side effects or reactions to medicines that you should watch for. Contact a health care provider if you: Think you are having a reaction to a medicine you are taking. Have headaches that keep coming back (recurring). Feel dizzy. Have swelling in your ankles. Have trouble with your vision. Get help right away if you: Develop a severe headache or confusion. Have unusual weakness or numbness. Feel faint. Have severe pain in your chest or abdomen. Vomit repeatedly. Have trouble breathing. These symptoms may be an emergency. Get help right away. Call 911. Do not wait to see if the symptoms will go away. Do not drive yourself to the hospital. Summary Hypertension is when the force of blood pumping through your arteries is too strong. If this condition is not controlled, it may put you at risk for serious complications. Your personal target blood pressure may vary depending on your medical conditions, your age, and other factors. For most people, a normal blood pressure is less than 120/80. Hypertension is treated with lifestyle changes, medicines, or a combination of both. Lifestyle changes include losing weight, eating a healthy,  low-sodium diet, exercising more, and limiting alcohol. This information is not intended to replace advice given to you by your health care provider. Make sure you discuss any questions you have with your health care provider. Document Revised: 11/05/2020 Document Reviewed: 11/05/2020 Elsevier Patient Education  [place], MD [facility]

## 2021-12-11 ENCOUNTER — Other Ambulatory Visit (HOSPITAL_COMMUNITY): Payer: Self-pay | Admitting: Ophthalmology

## 2021-12-11 ENCOUNTER — Encounter (HOSPITAL_COMMUNITY): Payer: Self-pay | Admitting: Ophthalmology

## 2021-12-11 DIAGNOSIS — H4720 Unspecified optic atrophy: Secondary | ICD-10-CM

## 2021-12-19 ENCOUNTER — Ambulatory Visit (HOSPITAL_COMMUNITY)
Admission: RE | Admit: 2021-12-19 | Discharge: 2021-12-19 | Disposition: A | Discharge status: Home / Self Care | Payer: Commercial Managed Care - HMO | Source: Ambulatory Visit | Attending: Ophthalmology | Admitting: Ophthalmology

## 2021-12-19 DIAGNOSIS — H4720 Unspecified optic atrophy: Secondary | ICD-10-CM | POA: Insufficient documentation

## 2021-12-19 MED ORDER — GADOBUTROL 1 MMOL/ML IV SOLN
8.5000 mL | Freq: Once | INTRAVENOUS | Status: AC | PRN
  Administered 2021-12-19: 8.5 mL via INTRAVENOUS

## 2022-01-20 ENCOUNTER — Encounter (HOSPITAL_COMMUNITY): Payer: Self-pay | Admitting: Ophthalmology

## 2022-01-20 ENCOUNTER — Other Ambulatory Visit (HOSPITAL_COMMUNITY): Payer: Self-pay | Admitting: Ophthalmology

## 2022-01-20 DIAGNOSIS — H47291 Other optic atrophy, right eye: Secondary | ICD-10-CM

## 2022-02-16 ENCOUNTER — Ambulatory Visit: Payer: Commercial Managed Care - HMO

## 2022-02-28 ENCOUNTER — Other Ambulatory Visit (HOSPITAL_COMMUNITY): Payer: Self-pay

## 2022-03-20 ENCOUNTER — Ambulatory Visit (HOSPITAL_BASED_OUTPATIENT_CLINIC_OR_DEPARTMENT_OTHER): Payer: Commercial Managed Care - HMO

## 2022-03-20 ENCOUNTER — Encounter (HOSPITAL_BASED_OUTPATIENT_CLINIC_OR_DEPARTMENT_OTHER): Payer: Self-pay

## 2022-04-03 ENCOUNTER — Ambulatory Visit (HOSPITAL_COMMUNITY): Admission: RE | Admit: 2022-04-03 | Payer: Commercial Managed Care - HMO | Source: Ambulatory Visit

## 2022-05-21 ENCOUNTER — Other Ambulatory Visit (HOSPITAL_COMMUNITY): Payer: Self-pay

## 2022-07-14 ENCOUNTER — Emergency Department (HOSPITAL_COMMUNITY)
Admission: EM | Admit: 2022-07-14 | Discharge: 2022-07-14 | Disposition: A | Discharge status: Home / Self Care | Payer: Commercial Managed Care - HMO | Attending: Emergency Medicine | Admitting: Emergency Medicine

## 2022-07-14 ENCOUNTER — Telehealth: Payer: Self-pay | Admitting: Infectious Disease

## 2022-07-14 ENCOUNTER — Emergency Department (HOSPITAL_COMMUNITY): Payer: Commercial Managed Care - HMO

## 2022-07-14 DIAGNOSIS — I10 Essential (primary) hypertension: Secondary | ICD-10-CM | POA: Diagnosis not present

## 2022-07-14 DIAGNOSIS — R682 Dry mouth, unspecified: Secondary | ICD-10-CM | POA: Insufficient documentation

## 2022-07-14 DIAGNOSIS — Z79899 Other long term (current) drug therapy: Secondary | ICD-10-CM | POA: Diagnosis not present

## 2022-07-14 DIAGNOSIS — R42 Dizziness and giddiness: Secondary | ICD-10-CM | POA: Diagnosis not present

## 2022-07-14 DIAGNOSIS — Z7984 Long term (current) use of oral hypoglycemic drugs: Secondary | ICD-10-CM | POA: Diagnosis not present

## 2022-07-14 DIAGNOSIS — E119 Type 2 diabetes mellitus without complications: Secondary | ICD-10-CM | POA: Insufficient documentation

## 2022-07-14 DIAGNOSIS — Z21 Asymptomatic human immunodeficiency virus [HIV] infection status: Secondary | ICD-10-CM | POA: Diagnosis not present

## 2022-07-14 LAB — DIFFERENTIAL
Abs Immature Granulocytes: 0.01 10*3/uL (ref 0.00–0.07)
Basophils Absolute: 0 10*3/uL (ref 0.0–0.1)
Basophils Relative: 0 %
Eosinophils Absolute: 0.1 10*3/uL (ref 0.0–0.5)
Eosinophils Relative: 3 %
Immature Granulocytes: 0 %
Lymphocytes Relative: 29 %
Lymphs Abs: 1 10*3/uL (ref 0.7–4.0)
Monocytes Absolute: 0.4 10*3/uL (ref 0.1–1.0)
Monocytes Relative: 12 %
Neutro Abs: 1.9 10*3/uL (ref 1.7–7.7)
Neutrophils Relative %: 56 %

## 2022-07-14 LAB — BASIC METABOLIC PANEL
Anion gap: 10 (ref 5–15)
BUN: 13 mg/dL (ref 6–20)
CO2: 25 mmol/L (ref 22–32)
Calcium: 9.4 mg/dL (ref 8.9–10.3)
Chloride: 101 mmol/L (ref 98–111)
Creatinine, Ser: 0.74 mg/dL (ref 0.44–1.00)
GFR, Estimated: >60 mL/min (ref >60)
Glucose, Bld: 235 mg/dL — ABNORMAL HIGH (ref 70–99)
Potassium: 3.6 mmol/L (ref 3.5–5.1)
Sodium: 136 mmol/L (ref 135–145)

## 2022-07-14 LAB — CBC
HCT: 38.8 % (ref 36.0–46.0)
Hemoglobin: 12 g/dL (ref 12.0–15.0)
MCH: 27.6 pg (ref 26.0–34.0)
MCHC: 30.9 g/dL (ref 30.0–36.0)
MCV: 89.4 fL (ref 80.0–100.0)
Platelets: 304 10*3/uL (ref 150–400)
RBC: 4.34 MIL/uL (ref 3.87–5.11)
RDW: 12.2 % (ref 11.5–15.5)
WBC: 3.5 10*3/uL — ABNORMAL LOW (ref 4.0–10.5)
nRBC: 0 % (ref 0.0–0.2)

## 2022-07-14 LAB — URINALYSIS, ROUTINE W REFLEX MICROSCOPIC
Bilirubin Urine: NEGATIVE
Glucose, UA: NEGATIVE mg/dL
Hgb urine dipstick: NEGATIVE
Ketones, ur: NEGATIVE mg/dL
Leukocytes,Ua: NEGATIVE
Nitrite: NEGATIVE
Protein, ur: NEGATIVE mg/dL
Specific Gravity, Urine: 1.008 (ref 1.005–1.030)
pH: 7 (ref 5.0–8.0)

## 2022-07-14 LAB — CBG MONITORING, ED: Glucose-Capillary: 216 mg/dL — ABNORMAL HIGH (ref 70–99)

## 2022-07-14 MED ORDER — LACTATED RINGERS IV BOLUS
1000.0000 mL | Freq: Once | INTRAVENOUS | Status: AC
  Administered 2022-07-14: 1000 mL via INTRAVENOUS

## 2022-07-14 NOTE — ED Provider Notes (Signed)
[place] EMERGENCY DEPARTMENT AT [facility] Provider Note  CSN: [id number] Arrival date & time: 07/14/22 1745  Chief Complaint(s) Headache and Dizziness  HPI Katrina Parker is a 52 y.o. female history of diabetes, hypertension, HIV not on medication presenting with lightheadedness.  Patient reports she developed episode of lightheadedness and dizziness, associated mild headache. No syncope or LOC.  This began this afternoon while she was at work.  No fevers or chills, neck stiffness.  No chest pain, abdominal pain.  No focal weakness.  No numbness or tingling.  No nausea or vomiting.  She reports she feels better currently.  Reports primarily generalized weakness.  She reports she is not taking her HIV medication because "I do not know how to see the doctor"  Past Medical History Past Medical History:  Diagnosis Date   Diabetes mellitus without complication (HCC)    HIV (human immunodeficiency virus infection) (HCC)    Patient Active Problem List   Diagnosis Date Noted   Dyslipidemia associated with type 2 diabetes mellitus (HCC) 02/18/2021   Hypertension associated with diabetes (HCC) 10/10/2020   Visual problems 10/10/2020   Vision changes 10/20/2017   Seasonal allergic rhinitis due to pollen 10/20/2017   Diabetes type 2, uncontrolled 09/13/2013   Obesity 09/13/2013   History of uterine fibroid 09/13/2013   Essential hypertension, benign 02/22/2012   Chronic right hip pain 02/22/2012   Post herpetic neuralgia 05/20/2010   ABDOMINAL WALL HERNIA 12/24/2006   Human immunodeficiency virus (HIV) disease (HCC) 09/29/2006   Home Medication(s) Prior to Admission medications   Medication Sig Start Date End Date Taking? Authorizing Provider  amLODipine (NORVASC) 5 MG tablet Take 1 tablet (5 mg total) by mouth daily. 09/12/21   Georgina Quint, MD  atorvastatin (LIPITOR) 20 MG tablet Take 1 tablet (20 mg total) by mouth daily. 09/12/21   Georgina Quint, MD   Dulaglutide (TRULICITY) 0.75 MG/0.5ML SOPN Inject 0.75 mg into the skin once a week. 11/30/21   Georgina Quint, MD  elvitegravir-cobicistat-emtricitabine-tenofovir (GENVOYA) 150-150-200-10 MG TABS tablet Take 1 tablet by mouth daily with breakfast. 03/13/15   Ginnie Smart, MD  fluconazole (DIFLUCAN) 100 MG tablet Take 1 tablet (100 mg total) by mouth daily. 01/22/16   Ginnie Smart, MD  fluticasone (FLONASE) 50 MCG/ACT nasal spray Place into the nose. 10/20/17   [provider]  glucose monitoring kit (FREESTYLE) monitoring kit 1 each by Does not apply route 2 (two) times daily at 8 am and 10 pm. 09/14/13   Massie Maroon, FNP  lisinopril-hydrochlorothiazide (ZESTORETIC) 10-12.5 MG tablet Take 1 tablet by mouth daily. 09/12/21   Georgina Quint, MD  metFORMIN (GLUCOPHAGE) 500 MG tablet Take 1 tablet (500 mg total) by mouth 2 (two) times daily with a meal. 09/12/21   Georgina Quint, MD  ONE TOUCH ULTRA TEST test strip Reported on 06/26/2015 11/03/13   [provider]  prednisoLONE acetate (PRED FORTE) 1 % ophthalmic suspension Place 1 drop into both eyes daily. 05/28/21  Past Surgical History Past Surgical History:  Procedure Laterality Date   OTHER SURGICAL HISTORY     2 abdominal surgeries (1 in [place], 1 in [place]; unsure kind of procedure, states there might have been a "tumor")   Family History Family History  Problem Relation Age of Onset   Diabetes Neg Hx    Heart disease Neg Hx    Cancer Neg Hx     Social History Social History   Tobacco Use   Smoking status: Never   Smokeless tobacco: Never  Substance Use Topics   Alcohol use: No    Alcohol/week: 0.0 standard drinks of alcohol   Drug use: No   Allergies Farxiga [dapagliflozin] and Ibuprofen  Review of Systems Review of Systems  All other systems  reviewed and are negative.   Physical Exam Vital Signs  I have reviewed the triage vital signs BP (!) 146/111 (BP Location: Left Arm)   Pulse 89   Temp 98 F (36.7 C)   Resp 17   LMP 06/01/2011   SpO2 96%  Physical Exam Vitals and nursing note reviewed.  Constitutional:      General: She is not in acute distress.    Appearance: She is well-developed.  HENT:     Head: Normocephalic and atraumatic.     Mouth/Throat:     Mouth: Mucous membranes are dry.  Eyes:     Pupils: Pupils are equal, round, and reactive to light.  Neck:     Meningeal: Brudzinski's sign and Kernig's sign absent.  Cardiovascular:     Rate and Rhythm: Normal rate and regular rhythm.     Heart sounds: No murmur heard. Pulmonary:     Effort: Pulmonary effort is normal. No respiratory distress.     Breath sounds: Normal breath sounds.  Abdominal:     General: Abdomen is flat.     Palpations: Abdomen is soft.     Tenderness: There is no abdominal tenderness.  Musculoskeletal:        General: No tenderness.     Cervical back: No rigidity.     Right lower leg: No edema.     Left lower leg: No edema.  Skin:    General: Skin is warm and dry.  Neurological:     Mental Status: She is alert. Mental status is at baseline.     Comments: Cranial nerves II through XII intact, strength 5 out of 5 in the bilateral upper and lower extremities, no sensory deficit to light touch, no dysmetria on finger-nose-finger testing, ambulatory with steady gait.   Psychiatric:        Mood and Affect: Mood normal.        Behavior: Behavior normal.     ED Results and Treatments Labs (all labs ordered are listed, but only abnormal results are displayed) Labs Reviewed  BASIC METABOLIC PANEL - Abnormal; Notable for the following components:      Result Value   Glucose, Bld 235 (*)    All other components within normal limits  CBC - Abnormal; Notable for the following components:   WBC 3.5 (*)    All other components within  normal limits  URINALYSIS, ROUTINE W REFLEX MICROSCOPIC - Abnormal; Notable for the following components:   Color, Urine STRAW (*)    All other components within normal limits  CBG MONITORING, ED - Abnormal; Notable for the following components:   Glucose-Capillary 216 (*)    All other components within normal limits  DIFFERENTIAL  Radiology CT Head Wo Contrast  Result Date: 07/14/2022 CLINICAL DATA:  Headache, increasing frequency or severity EXAM: CT HEAD WITHOUT CONTRAST TECHNIQUE: Contiguous axial images were obtained from the base of the skull through the vertex without intravenous contrast. RADIATION DOSE REDUCTION: This exam was performed according to the departmental dose-optimization program which includes automated exposure control, adjustment of the mA and/or kV according to patient size and/or use of iterative reconstruction technique. COMPARISON:  None Available. FINDINGS: Brain: Patchy low-density in the white matter, likely chronic small vessel disease. No acute intracranial abnormality. Specifically, no hemorrhage, hydrocephalus, mass lesion, acute infarction, or significant intracranial injury. Vascular: No hyperdense vessel or unexpected calcification. Skull: No acute calvarial abnormality. Sinuses/Orbits: No acute findings Other: None IMPRESSION: Patchy low-density in the deep white matter, likely chronic small vessel disease. No acute intracranial abnormality. Electronically Signed   By: Charlett Nose M.D.   On: 07/14/2022 22:27    Pertinent labs & imaging results that were available during my care of the patient were reviewed by me and considered in my medical decision making (see MDM for details).  Medications Ordered in ED Medications  lactated ringers bolus 1,000 mL (0 mLs Intravenous Stopped 07/14/22 2141)                                                                                                                                      Procedures Procedures  (including critical care time)  Medical Decision Making / ED Course   MDM:  52 year old female presenting to the emergency department generalized weakness and lightheadedness.  Patient is overall well-appearing, vital signs reassuring.  No fever, no tachycardia.  No meningismus.  Will check labs, will check CBC with differential.  Will give IV fluids.  Will also obtain CT scan of the head to evaluate for subarachnoid hemorrhage or other acute process.  Extremely low concern for infection given onset of symptoms.  Given that patient has been lost to follow-up for so long with infectious disease clinic will likely page them to help facilitate follow-up.  Clinical Course as of 07/14/22 2255  Tue Jul 14, 2022  2124 Patient reports her symptoms are all resolved after fluids.  Suspect dehydration, possibly related to mild hyperglycemia.  I spoke with Dr. Daiva Eves who has scheduled patient for ID follow up. Pending CT scan. Low concern for acute complication of HIV/AIDS [WS]  2248 CT head without evidence of acute bleeding, tumor.  Patient reports her symptoms are still resolved.  Discussed extreme importance of following up with infectious disease so she can resume HIV medication.  Discussed with her that appointment has been scheduled and she needs to follow-up with this appointment.  Also discussed that if she has recurrent headache as well as any other new symptoms such as fevers or chills, nausea or vomiting, neck stiffness, chest or abdominal pain, rash or any other symptoms she should come back to the emergency  department for reevaluation.. [WS]    Clinical Course User Index [WS] Lonell Grandchild, MD     Additional history obtained: -Additional history obtained from family -External records from outside source obtained and reviewed including: Chart review including previous notes,  labs, imaging, consultation notes including medical recorss   Lab Tests: -I ordered, reviewed, and interpreted labs.   The pertinent results include:   Labs Reviewed  BASIC METABOLIC PANEL - Abnormal; Notable for the following components:      Result Value   Glucose, Bld 235 (*)    All other components within normal limits  CBC - Abnormal; Notable for the following components:   WBC 3.5 (*)    All other components within normal limits  URINALYSIS, ROUTINE W REFLEX MICROSCOPIC - Abnormal; Notable for the following components:   Color, Urine STRAW (*)    All other components within normal limits  CBG MONITORING, ED - Abnormal; Notable for the following components:   Glucose-Capillary 216 (*)    All other components within normal limits  DIFFERENTIAL    Notable for mild hyperglycemia, mild leukopenia  EKG   EKG Interpretation Date/Time:  Tuesday July 14 2022 17:52:43 EDT Ventricular Rate:  90 PR Interval:  162 QRS Duration:  82 QT Interval:  370 QTC Calculation: 452 R Axis:   37  Text Interpretation: Normal sinus rhythm Normal ECG Confirmed by Alvino Blood ([id number]) on 07/14/2022 6:51:56 PM         Imaging Studies ordered: I ordered imaging studies including CT head On my interpretation imaging demonstrates no acute process I independently visualized and interpreted imaging. I agree with the radiologist interpretation   Medicines ordered and prescription drug management: Meds ordered this encounter  Medications   lactated ringers bolus 1,000 mL    -I have reviewed the patients home medicines and have made adjustments as needed   Consultations Obtained: I requested consultation with the infectious disease,  and discussed lab and imaging findings as well as pertinent plan - they recommend: they have scheduled patient for an appointment to re-establish care   Social Determinants of Health:  Diagnosis or treatment significantly limited by social determinants of  health: obesity   Reevaluation: After the interventions noted above, I reevaluated the patient and found that their symptoms have resolved  Co morbidities that complicate the patient evaluation  Past Medical History:  Diagnosis Date   Diabetes mellitus without complication (HCC)    HIV (human immunodeficiency virus infection) (HCC)       Dispostion: Disposition decision including need for hospitalization was considered, and patient discharged from emergency department.    Final Clinical Impression(s) / ED Diagnoses Final diagnoses:  Lightheadedness  Asymptomatic HIV infection, with no history of HIV-related illness (HCC)     This chart was dictated using voice recognition software.  Despite best efforts to proofread,  errors can occur which can change the documentation meaning.    Lonell Grandchild, MD 07/14/22 2255

## 2022-07-14 NOTE — Discharge Instructions (Addendum)
We evaluated you for your lightheadedness.  Your symptoms are most likely caused by dehydration.  Your symptoms improved with IV fluids.  Your laboratory tests did not show any dangerous condition.  We obtained a CT scan of your head which did not show any bleeding or brain tumors.  We spoke with the infectious disease doctors.  It is very very important to restart your medication for HIV.  They said that they would like to see you at the infectious disease center July 10 at 1:45 PM for an appointment so that they can restart you on medication.  If you develop any new or worsening symptoms such as recurrent headache, fevers or chills, neck stiffness, chest or abdominal pain, cough, lightheadedness, fainting, or any other new symptoms, please return to the emergency department right away.

## 2022-07-14 NOTE — Progress Notes (Signed)
      INFECTIOUS DISEASE ATTENDING ADDENDUM:   Date: 07/14/2022  Patient name: Katrina Parker  Medical record number: [id number]  Date of birth: [birth year]   Case discussed with Dr. Suezanne Jacquet  I was thinking further about this case and while her headaches do not appear to be as much of a problem to her after spending time in the ER I would not want to restart anti-retroviral therapy on her IF she were to have undiagnosed cryptococcal meningitis    I am ordering a crypto ag on serum tonight along with CD4, HIV RNA, genotype, RPR  I have scheduled the patient to see Margarite Gouge with [facility] pharmacy in clinic  When she does restart ARV would start Biktarvy or higher barrier to R medicine      Paulette Blanch Dam 07/14/2022, 10:59 PM

## 2022-07-14 NOTE — ED Triage Notes (Signed)
[facility] for Sudden onset headache today while working also weakness. Per EMS pt complained of throbbing headpain. During triage pt stated head hurts a little but really just feels weak.

## 2022-07-15 ENCOUNTER — Telehealth: Payer: Self-pay

## 2022-07-15 ENCOUNTER — Encounter: Payer: Self-pay | Admitting: Internal Medicine

## 2022-07-15 LAB — CD4/CD8 (T-HELPER/T-SUPPRESSOR CELL)
CD4 absolute: 172 /uL — ABNORMAL LOW (ref 400–1790)
CD4%: 12.19 % — ABNORMAL LOW (ref 33–65)
CD8 T Cell Abs: 773 /uL (ref 190–1000)
CD8tox: 54.77 % — ABNORMAL HIGH (ref 12–40)
Ratio: 0.22 — ABNORMAL LOW (ref 1.0–3.0)
Total lymphocyte count: 1411 /uL (ref 1000–4000)

## 2022-07-15 LAB — CRYPTOCOCCAL ANTIGEN: Crypto Ag: NEGATIVE

## 2022-07-15 LAB — RPR: RPR Ser Ql: NONREACTIVE

## 2022-07-15 NOTE — Progress Notes (Signed)
Subjective:    Patient ID: Katrina Parker, female    DOB: 18-Jul-1970, 52 y.o.   MRN: [id number]     HPI Katrina Parker is here for follow up from the hospital.    ED 7/2 for lightheadedness  She developed lightheadedness/dizziness associated with mild headache and generalized weakness that afternoon while at work.  No LOC, fever, chest pain, abd pain, N/T, focal weakness or N/V.  She felt better in the ED.    She reported she was not taking her HIV meds because she was not sure how to see the doctor.   Has an appointment with ID 7/10.    EKG, Ct head, UA, cbc, bmp, rpr, cryptococcal Ag normal/neg.  Received IVF.  All symptoms resolved after IVF -  likely had dehydration, mild hyperglycemia.    She feels dizzy - it feels like her eyes are moving and if she does not sit down she feels like she will fall.  Not worse with head movements.    Having right sided jaw pain and has swelling - coming down now.  Has bad teeth - pain with eating on right side.  She mentions she has not been taking the Trulicity-there is an issue with getting it filled-it sounds like it was a cost issue.  She is taking the metformin.  Bilateral knee pain - this is chronic - it is much worse.  This has been worse since 6/18.  It has been harder to walk since then.  Has seen Dr Katrina Parker.    Medications and allergies reviewed with patient and updated if appropriate.  Current Outpatient Medications on File Prior to Visit  Medication Sig Dispense Refill   amLODipine (NORVASC) 5 MG tablet Take 1 tablet (5 mg total) by mouth daily. 90 tablet 3   atorvastatin (LIPITOR) 20 MG tablet Take 1 tablet (20 mg total) by mouth daily. 90 tablet 3   Dulaglutide (TRULICITY) 0.75 MG/0.5ML SOPN Inject 0.75 mg into the skin once a week. 6 mL 3   elvitegravir-cobicistat-emtricitabine-tenofovir (GENVOYA) 150-150-200-10 MG TABS tablet Take 1 tablet by mouth daily with breakfast. 90 tablet 3   fluconazole (DIFLUCAN) 100 MG tablet Take 1  tablet (100 mg total) by mouth daily. 7 tablet 2   fluticasone (FLONASE) 50 MCG/ACT nasal spray Place into the nose.     glucose monitoring kit (FREESTYLE) monitoring kit 1 each by Does not apply route 2 (two) times daily at 8 am and 10 pm. 1 each 0   lisinopril-hydrochlorothiazide (ZESTORETIC) 10-12.5 MG tablet Take 1 tablet by mouth daily. 90 tablet 3   metFORMIN (GLUCOPHAGE) 500 MG tablet Take 1 tablet (500 mg total) by mouth 2 (two) times daily with a meal. 180 tablet 3   ONE TOUCH ULTRA TEST test strip Reported on 06/26/2015  3   prednisoLONE acetate (PRED FORTE) 1 % ophthalmic suspension Place 1 drop into both eyes daily. 10 mL 0   No current facility-administered medications on file prior to visit.     Review of Systems  Constitutional:  Negative for fever.  HENT:  Positive for ear pain (right - related to tooth). Negative for congestion, sinus pressure and sinus pain.        Tooth pain, jaw swelling  Respiratory:  Negative for cough, shortness of breath and wheezing.   Cardiovascular:  Positive for leg swelling (LLE > RLE - chronic). Negative for chest pain and palpitations.  Gastrointestinal:  Negative for abdominal pain, constipation, diarrhea and  nausea.  Genitourinary:  Negative for dysuria.  Musculoskeletal:  Positive for arthralgias (chronic b/) and back pain.  Neurological:  Positive for dizziness. Negative for headaches.       Objective:   Vitals:   07/17/22 0814  BP: (!) 140/80  Pulse: 92  Temp: 98.4 F (36.9 C)  SpO2: 98%   BP Readings from Last 3 Encounters:  07/17/22 (!) 140/80  07/14/22 122/73  12/08/21 130/70   Wt Readings from Last 3 Encounters:  07/17/22 199 lb (90.3 kg)  12/08/21 194 lb 2 oz (88.1 kg)  10/08/21 189 lb (85.7 kg)   Body mass index is 32.12 kg/m.    Physical Exam Constitutional:      General: She is not in acute distress.    Appearance: Normal appearance.  HENT:     Head: Normocephalic and atraumatic.     Mouth/Throat:      Comments: Poor dentition - right lower tooth has some gum erythema and tooth has some decay.  Right lower cheek slightly swollen and tender to palpation Eyes:     Conjunctiva/sclera: Conjunctivae normal.  Cardiovascular:     Rate and Rhythm: Normal rate and regular rhythm.     Heart sounds: Murmur (2/6 sys) heard.  Pulmonary:     Effort: Pulmonary effort is normal. No respiratory distress.     Breath sounds: Normal breath sounds. No wheezing.  Musculoskeletal:     Cervical back: Neck supple.     Right lower leg: No edema.     Left lower leg: No edema.  Lymphadenopathy:     Cervical: No cervical adenopathy.  Skin:    General: Skin is warm and dry.     Findings: No rash.  Neurological:     Mental Status: She is alert. Mental status is at baseline.  Psychiatric:        Mood and Affect: Mood normal.        Behavior: Behavior normal.        Lab Results  Component Value Date   WBC 3.5 (L) 07/14/2022   HGB 12.0 07/14/2022   HCT 38.8 07/14/2022   PLT 304 07/14/2022   GLUCOSE 235 (H) 07/14/2022   CHOL 137 12/08/2021   TRIG 68.0 12/08/2021   HDL 55.10 12/08/2021   LDLCALC 68 12/08/2021   ALT 22 12/08/2021   AST 22 12/08/2021   NA 136 07/14/2022   K 3.6 07/14/2022   CL 101 07/14/2022   CREATININE 0.74 07/14/2022   BUN 13 07/14/2022   CO2 25 07/14/2022   TSH 1.502 01/16/2013   HGBA1C 6.0 (A) 12/08/2021   MICROALBUR <0.7 12/08/2021     Assessment & Plan:    Diabetes, type II, without insulin, with hyperglycemia: Chronic Has not been taking Trulicity secondary to cost of medication She is taking her metformin 500 mg twice daily Sugars are likely elevated which could be the cause of some of her lightheadedness/dizziness Increase metformin to 1000 mg twice daily-discussed that she needs to be taking this with her meals Check A1c today- Lab Results  Component Value Date   HGBA1C 8.1 (A) 07/17/2022   HGBA1C 8.1 07/17/2022   HGBA1C 8.1 (A) 07/17/2022   HGBA1C 8.1 (A)  07/17/2022   Advise follow-up with PCP first available and discussed that she needs to be following up regularly  Lightheadedness/dizziness: New Started earlier this week while she was at work ?  Related to elevated sugars-in the ED her sugar was over 200 She also has a tooth infection  which could be contributing Stressed to increase fluids If dizziness does not resolve advised to follow-up  Tooth infection: Subacute Sounds like this has been going on for a while Has right tooth that appears to be infected and has had right lower jaw swelling and pain Has had several of her teeth removed and knows she needs to have additional work-advised that she follow-up with dentist Start Augmentin 875-125 twice daily x 10 days  Bilateral knee pain: Related to osteoarthritis Having increased knee pain bilaterally Has seen sports medicine downstairs Advised to follow-up with them for repeat injections and further treatment

## 2022-07-15 NOTE — Transitions of Care (Post Inpatient/ED Visit) (Signed)
 07/15/2022  Name: Katrina Parker MRN: [id number] DOB: [birth year]  Today's TOC FU Call Status: Today's TOC FU Call Status:: Successful TOC FU Call Competed Unsuccessful Call (1st Attempt) Date: 07/15/22 [facility] FU Call Complete Date: 07/15/22  Transition Care Management Follow-up Telephone Call Date of Discharge: 07/14/22 Discharge Facility: [facility]) Type of Discharge: Emergency Department Reason for ED Visit: Other: (dizzy and light headiness) How have you been since you were released from the hospital?: Better (feels better/stronger) Any questions or concerns?: No  Items Reviewed: Did you receive and understand the discharge instructions provided?: No (reviewed instructions and afterwards patient verbalized understanding.) Medications obtained,verified, and reconciled?: Yes (Medications Reviewed) Any new allergies since your discharge?: No Dietary orders reviewed?: Yes Type of Diet Ordered:: resume regular diet Do you have support at home?: Yes  Medications Reviewed Today: Medications Reviewed Today     Reviewed by Georgina Quint, MD (Physician) on 12/08/21 at 0934  Med List Status: <None>   Medication Order Taking? Sig Documenting Provider Last Dose Status Informant  amLODipine (NORVASC) 5 MG tablet [id number]  Take 1 tablet (5 mg total) by mouth daily. Georgina Quint, MD  Active   atorvastatin (LIPITOR) 20 MG tablet [id number]  Take 1 tablet (20 mg total) by mouth daily. Georgina Quint, MD  Active   dapagliflozin propanediol (FARXIGA) 10 MG TABS tablet [id number]  Take 1 tablet (10 mg total) by mouth daily before breakfast. Georgina Quint, MD  Active   Dulaglutide (TRULICITY) 0.75 MG/0.5ML SOPN [id number]  Inject 0.75 mg into the skin once a week. Georgina Quint, MD  Active   elvitegravir-cobicistat-emtricitabine-tenofovir (GENVOYA) 150-150-200-10 MG TABS tablet [id number] No Take 1 tablet by mouth daily with breakfast. Ginnie Smart, MD Taking  Active   fluconazole (DIFLUCAN) 100 MG tablet [id number] No Take 1 tablet (100 mg total) by mouth daily. Ginnie Smart, MD Taking Active   fluticasone [facility]) 50 MCG/ACT nasal spray [id number] No Place into the nose. [provider] Taking Active   glucose monitoring kit (FREESTYLE) monitoring kit [id number] No 1 each by Does not apply route 2 (two) times daily at 8 am and 10 pm. Massie Maroon, FNP Taking Active   lisinopril-hydrochlorothiazide (ZESTORETIC) 10-12.5 MG tablet [id number]  Take 1 tablet by mouth daily. Georgina Quint, MD  Active   metFORMIN (GLUCOPHAGE) 500 MG tablet [id number]  Take 1 tablet (500 mg total) by mouth 2 (two) times daily with a meal. Georgina Quint, MD  Active   ONE TOUCH ULTRA TEST test strip [id number] No Reported on 06/26/2015 [provider] Taking Active            Med Note [facility] Lanius, Zachary George   Wed Jan 17, 2014  8:44 AM) Received from: External Pharmacy  prednisoLONE acetate (PRED FORTE) 1 % ophthalmic suspension [id number] No Place 1 drop into both eyes daily.  Taking Active             Home Care and Equipment/Supplies: Were Home Health Services Ordered?: NA Any new equipment or medical supplies ordered?: NA  Functional Questionnaire: Do you need assistance with bathing/showering or dressing?: No Do you need assistance with meal preparation?: No Do you need assistance with eating?: No Do you have difficulty maintaining continence: No Do you need assistance with getting out of bed/getting out of a chair/moving?: No Do you have difficulty managing or taking your medications?: No  Follow up appointments reviewed: PCP Follow-up appointment confirmed?: Yes Date of PCP follow-up  appointment?: 07/17/22 Follow-up Provider: Dr. Cheryll Cockayne Specialist [facility] Follow-up appointment confirmed?: Yes Date of Specialist follow-up appointment?: 07/22/22 Follow-Up Specialty Provider:: Infectious Diseases Do you need  transportation to your follow-up appointment?: No Do you understand care options if your condition(s) worsen?: Yes-patient verbalized understanding  Patient understood discharge instructions at the end of the phone call. She wrote down the providers name for her follow up appt on 07/17/22. She was given the phone number of the office to contact in case any questions or concerns arise.    SIGNATURE  Miniya Miguez, CMA  [facility] AWV Team Direct Dial: [phone]

## 2022-07-15 NOTE — Telephone Encounter (Signed)
See other note

## 2022-07-15 NOTE — Transitions of Care (Post Inpatient/ED Visit) (Signed)
   07/15/2022  Name: Katrina Parker MRN: [id number] DOB: [birth year]  Today's TOC FU Call Status: Today's TOC FU Call Status:: Unsuccessul Call (1st Attempt) Unsuccessful Call (1st Attempt) Date: 07/15/22  Attempted to reach the patient regarding the most recent Inpatient/ED visit.  Follow Up Plan: Additional outreach attempts will be made to reach the patient to complete the Transitions of Care (Post Inpatient/ED visit) call.   Signature   Hillery Bhalla, CMA  [facility] AWV Team Direct Dial: [phone]

## 2022-07-15 NOTE — Patient Instructions (Addendum)
    Make an appointment to follow up with Dr Denyse Amass downstairs for your bilateral knee pain.     Medications changes include :   Augmentin ( antibiotic for tooth infection).   Increase metformin to 2 pills with breakfast and 2 pills with dinner.     Return if symptoms worsen or fail to improve, for follow up with PCP first available - follow up.

## 2022-07-17 ENCOUNTER — Other Ambulatory Visit (HOSPITAL_COMMUNITY): Payer: Self-pay

## 2022-07-17 ENCOUNTER — Ambulatory Visit (INDEPENDENT_AMBULATORY_CARE_PROVIDER_SITE_OTHER): Payer: Commercial Managed Care - HMO | Admitting: Internal Medicine

## 2022-07-17 VITALS — BP 140/80 | HR 92 | Temp 98.4°F | Ht 66.0 in | Wt 199.0 lb

## 2022-07-17 DIAGNOSIS — M170 Bilateral primary osteoarthritis of knee: Secondary | ICD-10-CM

## 2022-07-17 DIAGNOSIS — E1165 Type 2 diabetes mellitus with hyperglycemia: Secondary | ICD-10-CM

## 2022-07-17 DIAGNOSIS — R42 Dizziness and giddiness: Secondary | ICD-10-CM | POA: Diagnosis not present

## 2022-07-17 DIAGNOSIS — Z7984 Long term (current) use of oral hypoglycemic drugs: Secondary | ICD-10-CM

## 2022-07-17 DIAGNOSIS — K047 Periapical abscess without sinus: Secondary | ICD-10-CM | POA: Diagnosis not present

## 2022-07-17 LAB — POCT GLYCOSYLATED HEMOGLOBIN (HGB A1C)
HbA1c POC (<> result, manual entry): 8.1 % (ref 4.0–5.6)
HbA1c, POC (controlled diabetic range): 8.1 % — AB (ref 0.0–7.0)
HbA1c, POC (prediabetic range): 8.1 % — AB (ref 5.7–6.4)
Hemoglobin A1C: 8.1 % — AB (ref 4.0–5.6)

## 2022-07-17 MED ORDER — AMOXICILLIN-POT CLAVULANATE 875-125 MG PO TABS
1.0000 | ORAL_TABLET | Freq: Two times a day (BID) | ORAL | 0 refills | Status: AC
  Filled 2022-07-17: qty 14, 7d supply, fill #0

## 2022-07-17 MED ORDER — METFORMIN HCL 500 MG PO TABS
1000.0000 mg | ORAL_TABLET | Freq: Two times a day (BID) | ORAL | 1 refills | Status: DC
Start: 2022-07-17 — End: 2023-04-20
  Filled 2022-07-17 – 2022-10-09 (×2): qty 360, 90d supply, fill #0
  Filled 2023-01-26: qty 360, 90d supply, fill #1

## 2022-07-20 ENCOUNTER — Telehealth: Payer: Self-pay | Admitting: Pharmacy Technician

## 2022-07-20 ENCOUNTER — Other Ambulatory Visit (HOSPITAL_COMMUNITY): Payer: Self-pay

## 2022-07-20 ENCOUNTER — Ambulatory Visit (INDEPENDENT_AMBULATORY_CARE_PROVIDER_SITE_OTHER): Payer: Commercial Managed Care - HMO | Admitting: Sports Medicine

## 2022-07-20 VITALS — BP 138/80 | HR 93 | Ht 66.0 in | Wt 199.0 lb

## 2022-07-20 DIAGNOSIS — M25562 Pain in left knee: Secondary | ICD-10-CM | POA: Diagnosis not present

## 2022-07-20 DIAGNOSIS — G8929 Other chronic pain: Secondary | ICD-10-CM

## 2022-07-20 DIAGNOSIS — M25561 Pain in right knee: Secondary | ICD-10-CM

## 2022-07-20 DIAGNOSIS — M170 Bilateral primary osteoarthritis of knee: Secondary | ICD-10-CM | POA: Diagnosis not present

## 2022-07-20 NOTE — Telephone Encounter (Signed)
[facility] Patient Product/process development scientist completed.    The patient is insured through [place] and has a 4156.38 copay.  We will continue to follow to see if copay assistance is needed.

## 2022-07-20 NOTE — Patient Instructions (Signed)
Tylenol [id number] mg 2-3 times a day for pain relief  Recommend talking to your work about disability and who you should see for disability 3 month follow up

## 2022-07-20 NOTE — Progress Notes (Signed)
Katrina Parker [name] [address] [place] [address] Phone: [phone]   Assessment and Plan:     1. Chronic pain of both knees 2. Primary osteoarthritis of both knees  -Chronic with exacerbation, subsequent visit - Patient received significant relief after bilateral intra-articular knee CSI last performed on 09/10/2021.  Significant relief lasted for 4 weeks, the patient continued to have mild to moderate relief for several months after. - Patient elects for repeat bile lateral knee intra-articular CSI.  Tolerated well per note below.  Patient's blood glucose may temporarily increase with past medical history of DM type II - Start Tylenol 500 to 1000 mg tablets 2-3 times a day for day-to-day pain relief - Patient is not sure if she can continue to work her current hours due to knee pain.  We discussed that I could provide a short-term work note while we treat her knee pain, however she is interested in a permanent restriction.  Advised that she should discuss this with her work and find a disability provider that could better fit her needs. - Patient is not interested in surgery  Procedure: Knee Joint Injection Side: Bilateral Indication: Flare of osteoarthritis  Risks explained and consent was given verbally. The site was cleaned with alcohol prep. A needle was introduced with an anterio-lateral approach. Injection given using 2mL of 1% lidocaine without epinephrine and 1mL of kenalog 40mg /ml. This was well tolerated and resulted in symptomatic relief.  Needle was removed, hemostasis achieved, and post injection instructions were explained.  Procedure was repeated on contralateral side.  Pt was advised to call or return to clinic if these symptoms worsen or fail to improve as anticipated.   Pertinent previous records reviewed include none   Follow Up: 3 months for reevaluation.  Could consider repeat CSI   Subjective:   I, Katrina  Parker, am serving as a Neurosurgeon for Doctor Richardean Sale   Chief Complaint: bilateral knee and low back pain    HPI:    09/10/2021 Patient is a 52 year old female complaining of bilateral knee pain. Patient states bilateral knee pain and lumbar pain on and off for the past year but getting worse over the past 5 months, has a job where she has to stand for long time the left leg hurts more than the right , no MOI, sometimes she has numbness and tingling, ib and tylenol dies not help the pain , low back pain goes all the way across the back pain radiates down to her waist and up her back sometimes , the pain feels like it is biting her, hers toes are sometimes stiff   10/08/2021  Katrina Parker is a 52 y.o. female who presents to [facility] at [facility] today for f/u bilat knee pain and DDD of the lumbar spine. Pt was last seen by Dr. Jean Rosenthal on 09/10/21 and was given bilat knee steroid injections and was taught HEP and was advised to use Tylenol. Today, pt reports much relief from prior steroid injections. Pt has cont'd pain, but not as severe. Pt c/o cont'd LBP that is worse when she is standing for work. She is wearing a lumbar support belt when working.    Her main complaint is that her knees are hurting again and she is interested in repeat injections if possible.  She is not ready to consider total knee replacement at this time.   Dx imaging: 09/10/21 L-spine, R & L knee  XR             04/11/08 L-spine MRI  07/20/2022 Patient states that she has had pain for a week and some change. Is not able to walk with out pain. Does endorse antalgic gait. Would like bilat injection    Relevant Historical Information: Hypertension, DM type II, HIV  Additional pertinent review of systems negative.   Current Outpatient Medications:    amLODipine (NORVASC) 5 MG tablet, Take 1 tablet (5 mg total) by mouth daily., Disp: 90 tablet, Rfl: 3   amoxicillin-clavulanate (AUGMENTIN) 875-125 MG tablet,  Take 1 tablet by mouth 2 (two) times daily for 7 days., Disp: 14 tablet, Rfl: 0   atorvastatin (LIPITOR) 20 MG tablet, Take 1 tablet (20 mg total) by mouth daily., Disp: 90 tablet, Rfl: 3   Dulaglutide (TRULICITY) 0.75 MG/0.5ML SOPN, Inject 0.75 mg into the skin once a week., Disp: 6 mL, Rfl: 3   elvitegravir-cobicistat-emtricitabine-tenofovir (GENVOYA) 150-150-200-10 MG TABS tablet, Take 1 tablet by mouth daily with breakfast., Disp: 90 tablet, Rfl: 3   fluconazole (DIFLUCAN) 100 MG tablet, Take 1 tablet (100 mg total) by mouth daily., Disp: 7 tablet, Rfl: 2   fluticasone (FLONASE) 50 MCG/ACT nasal spray, Place into the nose., Disp: , Rfl:    glucose monitoring kit (FREESTYLE) monitoring kit, 1 each by Does not apply route 2 (two) times daily at 8 am and 10 pm., Disp: 1 each, Rfl: 0   lisinopril-hydrochlorothiazide (ZESTORETIC) 10-12.5 MG tablet, Take 1 tablet by mouth daily., Disp: 90 tablet, Rfl: 3   metFORMIN (GLUCOPHAGE) 500 MG tablet, Take 2 tablets (1,000 mg total) by mouth 2 (two) times daily with a meal. For your sugar, Disp: 360 tablet, Rfl: 1   ONE TOUCH ULTRA TEST test strip, Reported on 06/26/2015, Disp: , Rfl: 3   prednisoLONE acetate (PRED FORTE) 1 % ophthalmic suspension, Place 1 drop into both eyes daily., Disp: 10 mL, Rfl: 0   Objective:     Vitals:   07/20/22 0740  BP: 138/80  Pulse: 93  SpO2: 99%  Weight: 199 lb (90.3 kg)  Height: 5\' 6"  (1.676 m)      Body mass index is 32.12 kg/m.    Physical Exam:    General:  awake, alert oriented, no acute distress nontoxic Skin: no suspicious lesions or rashes Neuro:sensation intact, no deficits, strength 5/5 with no deficits, no atrophy, normal muscle tone Psych: No signs of anxiety, depression or other mood disorder   Bilateral knee: Mild swelling No deformity Neg fluid wave, joint milking ROM Flex 100, Ext 10 TTP medial joint line, medial femoral condyle NTTP over the quad tendon,  , lat fem condyle, patella, plica,  patella tendon, tibial tuberostiy, fibular head, posterior fossa, pes anserine bursa, gerdy's tubercle,   lateral jt line Neg anterior and posterior drawer Neg lachman Neg sag sign Negative varus stress Negative valgus stress Negative McMurray     Gait slow, short steps    Electronically signed by:  Katrina Parker [name] 8:19 AM 07/20/22

## 2022-07-22 ENCOUNTER — Other Ambulatory Visit: Payer: Self-pay

## 2022-07-22 ENCOUNTER — Ambulatory Visit (INDEPENDENT_AMBULATORY_CARE_PROVIDER_SITE_OTHER): Payer: Commercial Managed Care - HMO | Admitting: Pharmacist

## 2022-07-22 ENCOUNTER — Other Ambulatory Visit (HOSPITAL_COMMUNITY): Payer: Self-pay

## 2022-07-22 VITALS — Wt 198.2 lb

## 2022-07-22 DIAGNOSIS — B20 Human immunodeficiency virus [HIV] disease: Secondary | ICD-10-CM | POA: Diagnosis not present

## 2022-07-22 MED ORDER — SULFAMETHOXAZOLE-TRIMETHOPRIM 800-160 MG PO TABS
1.0000 | ORAL_TABLET | Freq: Every day | ORAL | 2 refills | Status: DC
Start: 2022-07-22 — End: 2022-12-24
  Filled 2022-07-22: qty 30, 30d supply, fill #0

## 2022-07-22 MED ORDER — BICTEGRAVIR-EMTRICITAB-TENOFOV 50-200-25 MG PO TABS
1.0000 | ORAL_TABLET | Freq: Every day | ORAL | 0 refills | Status: AC
Start: 2022-07-22 — End: 2022-08-19

## 2022-07-22 NOTE — Progress Notes (Signed)
NEW REFERRAL TO CPP CLINIC    HPI: Katrina Parker is a 52 y.o. female who presents to the [facility] pharmacy clinic for HIV follow-up.  Patient Active Problem List   Diagnosis Date Noted   Dyslipidemia associated with type 2 diabetes mellitus (HCC) 02/18/2021   Hypertension associated with diabetes (HCC) 10/10/2020   Visual problems 10/10/2020   Vision changes 10/20/2017   Seasonal allergic rhinitis due to pollen 10/20/2017   Diabetes type 2, uncontrolled 09/13/2013   Obesity 09/13/2013   History of uterine fibroid 09/13/2013   Essential hypertension, benign 02/22/2012   Chronic right hip pain 02/22/2012   Post herpetic neuralgia 05/20/2010   ABDOMINAL WALL HERNIA 12/24/2006   Human immunodeficiency virus (HIV) disease (HCC) 09/29/2006    Patient's Medications  New Prescriptions   BICTEGRAVIR-EMTRICITABINE-TENOFOVIR AF (BIKTARVY) 50-200-25 MG TABS TABLET    Take 1 tablet by mouth daily for 28 days.   SULFAMETHOXAZOLE-TRIMETHOPRIM (BACTRIM DS) 800-160 MG TABLET    Take 1 tablet by mouth daily.  Previous Medications   AMLODIPINE (NORVASC) 5 MG TABLET    Take 1 tablet (5 mg total) by mouth daily.   AMOXICILLIN-CLAVULANATE (AUGMENTIN) 875-125 MG TABLET    Take 1 tablet by mouth 2 (two) times daily for 7 days.   ATORVASTATIN (LIPITOR) 20 MG TABLET    Take 1 tablet (20 mg total) by mouth daily.   DULAGLUTIDE (TRULICITY) 0.75 MG/0.5ML SOPN    Inject 0.75 mg into the skin once a week.   FLUTICASONE (FLONASE) 50 MCG/ACT NASAL SPRAY    Place into the nose.   GLUCOSE MONITORING KIT (FREESTYLE) MONITORING KIT    1 each by Does not apply route 2 (two) times daily at 8 am and 10 pm.   LISINOPRIL-HYDROCHLOROTHIAZIDE (ZESTORETIC) 10-12.5 MG TABLET    Take 1 tablet by mouth daily.   METFORMIN (GLUCOPHAGE) 500 MG TABLET    Take 2 tablets (1,000 mg total) by mouth 2 (two) times daily with a meal. For your sugar   ONE TOUCH ULTRA TEST TEST STRIP    Reported on 06/26/2015  Modified Medications   No  medications on file  Discontinued Medications   ELVITEGRAVIR-COBICISTAT-EMTRICITABINE-TENOFOVIR (GENVOYA) 150-150-200-10 MG TABS TABLET    Take 1 tablet by mouth daily with breakfast.   FLUCONAZOLE (DIFLUCAN) 100 MG TABLET    Take 1 tablet (100 mg total) by mouth daily.   PREDNISOLONE ACETATE (PRED FORTE) 1 % OPHTHALMIC SUSPENSION    Place 1 drop into both eyes daily.    Allergies: Allergies  Allergen Reactions   Farxiga [Dapagliflozin] Rash   Ibuprofen Rash    Past Medical History: Past Medical History:  Diagnosis Date   Diabetes mellitus without complication (HCC)    HIV (human immunodeficiency virus infection) (HCC)     Social History: Social History   Socioeconomic History   Marital status: Widowed    Spouse name: Not on file   Number of children: Not on file   Years of education: Not on file   Highest education level: Not on file  Occupational History   Not on file  Tobacco Use   Smoking status: Never   Smokeless tobacco: Never  Substance and Sexual Activity   Alcohol use: No    Alcohol/week: 0.0 standard drinks of alcohol   Drug use: No   Sexual activity: Not Currently    Partners: Male    Comment: declined condoms  Other Topics Concern   Not on file  Social History Narrative   Not on  file   Social Determinants of Health   Financial Resource Strain: Not on file  Food Insecurity: Not on file  Transportation Needs: Not on file  Physical Activity: Not on file  Stress: Not on file  Social Connections: Not on file    Labs: Lab Results  Component Value Date   HIV1RNAQUANT 4,130 (H) 07/30/2016   HIV1RNAQUANT <20 05/15/2015   HIV1RNAQUANT 3,693 (H) 02/27/2015   CD4TABS 399 (L) 07/30/2016   CD4TABS 500 05/15/2015   CD4TABS 470 02/27/2015    RPR and STI Lab Results  Component Value Date   LABRPR NON REACTIVE 07/14/2022   LABRPR NON REAC 07/30/2016   LABRPR NON REAC 02/27/2015   LABRPR NON REAC 11/14/2014   LABRPR NON REAC 01/16/2013    STI  Results GC CT  07/30/2016 12:00 AM Negative  Negative   02/27/2015 12:00 AM Negative  Negative   11/14/2014 12:00 AM Negative  Negative   06/18/2009  7:26 PM  NEGATIVE     Hepatitis B Lab Results  Component Value Date   HEPBSAB POS (A) 09/08/2006   HEPBSAG NEG 09/08/2006   HEPBCAB POS (A) 09/08/2006   Hepatitis C No results found for: "HEPCAB", "HCVRNAPCRQN" Hepatitis A Lab Results  Component Value Date   HAV REACTIVE (A) 08/14/2013   Lipids: Lab Results  Component Value Date   CHOL 137 12/08/2021   TRIG 68.0 12/08/2021   HDL 55.10 12/08/2021   CHOLHDL 2 12/08/2021   VLDL 13.6 12/08/2021   LDLCALC 68 12/08/2021    Current HIV Regimen: Off of medication x 9 years   Assessment: Katrina Parker presents to clinic today for HIV follow-up after not following with ID since 2018; previously followed with Dr. [name]. She recently presented to the [facility] ED on 7/2 for lightheadedness which her IM physician attributes to increased blood sugars and tooth infection. Dr. Daiva Eves was consulted, and he checked a cryptococcal antigen to ensure this was not a symptom of cryptococcal meningitis. This returned negative, and thankfully her CD4 count was only slightly low at 172. She states she is still experiencing some lightheadedness/dizziness but denies any double vision, blurry vision, floaters, or headache. Her metformin dose was increased for her T2DM given she cannot afford Trulicity at this time, so hopefully this helps lower her blood sugars.   Katrina Parker states she has not taken medication for HIV since 2015 because she was "tired" of taking HIV medicine; this is demonstrated by her viremia since 2015. Her viral load was certainly lower than expected between 2015 and 2018, so she may be somewhat of a long-term non-progressor.  She does not know the names of the medications she has taken but knows how they made her feel. States she liked taking Genvoya but did not like taking Atripla, Triumeq, or Odefsey.  Counseled patient on the progression of HIV without medication including an increased risk for opportunistic infections and inflammation.   Counseled patient on transitioning to [place] which she agrees to. She denies any questions about restarting medication and said she would like [place] to help her restart treatment. Her copay is over $4000/month with Rosann Auerbach, so she met with Marcelino Duster today to apply for supplemental [employer] coverage. Provided her with one month of Biktarvy samples, and counseled her to please call us if she gets down to one bottle and still has not heard about her assistance approval. She would like to pick up her Biktarvy from [facility]; will need to fill with [facility] if she is approved for  supplemental RW.   Explained that Susanne Borders is a one pill once daily medication with or without food and the importance of not missing any doses. Explained resistance and how it develops and why it is so important to take Biktarvy daily and not skip days or doses. Counseled patient to take it around the same time each day. Counseled on what to do if dose is missed, if closer to missed dose take immediately, if closer to next dose then skip and resume normal schedule. Cautioned on possible side effects the first week or so including nausea, diarrhea, dizziness, and headaches but that they should resolve after the first couple of weeks. I reviewed patient medications and found no drug interactions. Counseled patient to separate Biktarvy from divalent cations including multivitamins. Discussed with patient to call clinic if she starts a new medication or herbal supplement. I gave the patient my card and told her to call me with any issues/questions/concerns.  Will need to start Bactrim 1 DS daily for PCP prophylaxis given her CD4 count is < 200 at this time; will recheck in 3 months. Emphasized importance of taking her Biktarvy to help restore her immune function. Did not discuss vaccines during this visit.    Patient needs repeat HIV RNA drawn given lab from hospital may not result. She refused labs today because she still feels very "weak" from recent lab draws in the ED. Will call her next week to check in on how she is feeling and get her scheduled for blood work. Will see a new provider in 1 month for follow-up.   Plan: - Start Biktarvy (samples provided) - Start Bactrim 1 DS daily  - Follow-up on supplemental RW coverage for copay assistance - Check viral load next week  - Follow-up with Judeth Cornfield for new appointment on 08/19/22   Margarite Gouge, PharmD, CPP, BCIDP, AAHIVP Clinical Pharmacist Practitioner Infectious Diseases Clinical Pharmacist [facility] 07/22/2022, 10:15 AM

## 2022-07-22 NOTE — Progress Notes (Signed)
Medication Samples have been provided to the patient.  Drug name: Biktarvy        Strength: 50/200/25 mg       Qty: 28 tablets (4 bottles) LOT: CPP2HA   Exp.Date: 8/26  Dosing instructions: Take one tablet by mouth once daily  The patient has been instructed regarding the correct time, dose, and frequency of taking this medication, including desired effects and most common side effects.   Miche Loughridge, PharmD, CPP, BCIDP, AAHIVP Clinical Pharmacist Practitioner Infectious Diseases Clinical Pharmacist [facility]  

## 2022-07-23 LAB — GENOSURE INTEGRASE HIV EDI: HIV Genosure Integrase PDF 2: 1

## 2022-07-23 LAB — HIV GENOSURE REFLEX - HIVGTY - ELECTRONIC RECORD

## 2022-07-27 ENCOUNTER — Other Ambulatory Visit (HOSPITAL_COMMUNITY): Payer: Self-pay

## 2022-07-29 ENCOUNTER — Telehealth: Payer: Self-pay | Admitting: Pharmacist

## 2022-07-29 NOTE — Telephone Encounter (Signed)
Patient refused to get blood work done last week as she was still feeling weak from multiple blood draws during hospital admission. Requested I check in with her next week to then schedule blood work once she was feeling better. LVM today to schedule blood work. Hopefully she will get these done before your appointment in August, but in case she didn't, wanted to give you a heads up. Thanks!

## 2022-07-30 ENCOUNTER — Encounter: Payer: Self-pay | Admitting: Infectious Diseases

## 2022-07-30 ENCOUNTER — Other Ambulatory Visit (HOSPITAL_COMMUNITY): Payer: Self-pay

## 2022-08-19 ENCOUNTER — Ambulatory Visit: Payer: Commercial Managed Care - HMO | Admitting: Infectious Diseases

## 2022-09-03 ENCOUNTER — Encounter: Payer: Self-pay | Admitting: Emergency Medicine

## 2022-09-03 ENCOUNTER — Other Ambulatory Visit (HOSPITAL_COMMUNITY): Payer: Self-pay

## 2022-09-03 ENCOUNTER — Ambulatory Visit (INDEPENDENT_AMBULATORY_CARE_PROVIDER_SITE_OTHER): Payer: Commercial Managed Care - HMO | Admitting: Emergency Medicine

## 2022-09-03 VITALS — BP 136/84 | HR 80 | Temp 98.0°F | Ht 66.0 in | Wt 194.0 lb

## 2022-09-03 DIAGNOSIS — B20 Human immunodeficiency virus [HIV] disease: Secondary | ICD-10-CM | POA: Diagnosis not present

## 2022-09-03 DIAGNOSIS — I152 Hypertension secondary to endocrine disorders: Secondary | ICD-10-CM | POA: Diagnosis not present

## 2022-09-03 DIAGNOSIS — E785 Hyperlipidemia, unspecified: Secondary | ICD-10-CM

## 2022-09-03 DIAGNOSIS — B3731 Acute candidiasis of vulva and vagina: Secondary | ICD-10-CM

## 2022-09-03 DIAGNOSIS — E1169 Type 2 diabetes mellitus with other specified complication: Secondary | ICD-10-CM

## 2022-09-03 DIAGNOSIS — E1159 Type 2 diabetes mellitus with other circulatory complications: Secondary | ICD-10-CM

## 2022-09-03 DIAGNOSIS — M79604 Pain in right leg: Secondary | ICD-10-CM

## 2022-09-03 DIAGNOSIS — G8929 Other chronic pain: Secondary | ICD-10-CM

## 2022-09-03 DIAGNOSIS — M79605 Pain in left leg: Secondary | ICD-10-CM

## 2022-09-03 MED ORDER — FLUCONAZOLE 150 MG PO TABS
150.0000 mg | ORAL_TABLET | Freq: Once | ORAL | 0 refills | Status: AC
Start: 2022-09-03 — End: 2022-09-03
  Filled 2022-09-03 – 2022-10-09 (×2): qty 1, 1d supply, fill #0

## 2022-09-03 MED ORDER — TRULICITY 1.5 MG/0.5ML ~~LOC~~ SOAJ
1.5000 mg | SUBCUTANEOUS | 3 refills | Status: DC
Start: 2022-09-03 — End: 2023-05-28
  Filled 2022-09-03: qty 2, 28d supply, fill #0
  Filled 2022-10-09: qty 6, 84d supply, fill #0
  Filled 2023-01-01: qty 6, 84d supply, fill #1
  Filled 2023-03-18 – 2023-04-22 (×2): qty 2, 28d supply, fill #2

## 2022-09-03 NOTE — Assessment & Plan Note (Signed)
Clinically stable.  Follows up with ID clinic on a regular basis

## 2022-09-03 NOTE — Assessment & Plan Note (Signed)
BP Readings from Last 3 Encounters:  09/03/22 136/84  07/20/22 138/80  07/17/22 (!) 140/80  Well-controlled hypertension. Continue amlodipine 5 mg daily and Zestoretic 10-12.5 mg daily Last hemoglobin A1c at 8.1 on 07/17/2022 Recommend to continue metformin 1000 mg twice a day and increase Trulicity to 1.5 mg weekly Cardiovascular risks associated with hypertension and diabetes discussed Diet and nutrition discussed Follow-up in 3 months

## 2022-09-03 NOTE — Progress Notes (Signed)
Katrina Parker 52 y.o.   Chief Complaint  Patient presents with   Leg Pain    Bilateral leg pain x June    Peeling    Patient states that both side of her private area is peeling very bad x 3 weeks. Itching inside of her vagina also.     HISTORY OF PRESENT ILLNESS: This is a 52 y.o. female with history of diabetes complaining of bilateral leg pain since last June Also complaining of vaginal itching with some peeling in the same area No other complaints or medical concerns today.  Leg Pain      Prior to Admission medications   Medication Sig Start Date End Date Taking? Authorizing Provider  amLODipine (NORVASC) 5 MG tablet Take 1 tablet (5 mg total) by mouth daily. 09/12/21  Yes Montae Stager, Eilleen Kempf, MD  atorvastatin (LIPITOR) 20 MG tablet Take 1 tablet (20 mg total) by mouth daily. 09/12/21  Yes Yuma Pacella, Eilleen Kempf, MD  Dulaglutide (TRULICITY) 1.5 MG/0.5ML SOPN Inject 1.5 mg into the skin once a week. 09/03/22  Yes Labrea Eccleston, Eilleen Kempf, MD  fluconazole (DIFLUCAN) 150 MG tablet Take 1 tablet (150 mg total) by mouth once for 1 dose. 09/03/22 09/04/22 Yes Ram Haugan, Eilleen Kempf, MD  fluticasone [facility]) 50 MCG/ACT nasal spray Place into the nose. 10/20/17  Yes [provider]  glucose monitoring kit (FREESTYLE) monitoring kit 1 each by Does not apply route 2 (two) times daily at 8 am and 10 pm. 09/14/13  Yes Massie Maroon, FNP  lisinopril-hydrochlorothiazide (ZESTORETIC) 10-12.5 MG tablet Take 1 tablet by mouth daily. 09/12/21  Yes Karlena Luebke, Eilleen Kempf, MD  metFORMIN (GLUCOPHAGE) 500 MG tablet Take 2 tablets (1,000 mg total) by mouth 2 (two) times daily with a meal. For your sugar 07/17/22  Yes Pincus Sanes, MD  ONE TOUCH ULTRA TEST test strip Reported on 06/26/2015 11/03/13  Yes [provider]  sulfamethoxazole-trimethoprim (BACTRIM DS) 800-160 MG tablet Take 1 tablet by mouth daily. 07/22/22  Yes Jennette Kettle, RPH-CPP    Allergies  Allergen Reactions   Marcelline Deist  [Dapagliflozin] Rash   Ibuprofen Rash    Patient Active Problem List   Diagnosis Date Noted   Dyslipidemia associated with type 2 diabetes mellitus (HCC) 02/18/2021   Hypertension associated with diabetes (HCC) 10/10/2020   Visual problems 10/10/2020   Vision changes 10/20/2017   Seasonal allergic rhinitis due to pollen 10/20/2017   Diabetes type 2, uncontrolled 09/13/2013   Obesity 09/13/2013   History of uterine fibroid 09/13/2013   Essential hypertension, benign 02/22/2012   Chronic right hip pain 02/22/2012   Post herpetic neuralgia 05/20/2010   ABDOMINAL WALL HERNIA 12/24/2006   Human immunodeficiency virus (HIV) disease (HCC) 09/29/2006    Past Medical History:  Diagnosis Date   Diabetes mellitus without complication (HCC)    HIV (human immunodeficiency virus infection) (HCC)     Past Surgical History:  Procedure Laterality Date   OTHER SURGICAL HISTORY     2 abdominal surgeries (1 in [place], 1 in [place]; unsure kind of procedure, states there might have been a "tumor")    Social History   Socioeconomic History   Marital status: Widowed    Spouse name: Not on file   Number of children: Not on file   Years of education: Not on file   Highest education level: Not on file  Occupational History   Not on file  Tobacco Use   Smoking status: Never   Smokeless tobacco: Never  Substance  and Sexual Activity   Alcohol use: No    Alcohol/week: 0.0 standard drinks of alcohol   Drug use: No   Sexual activity: Not Currently    Partners: Male    Comment: declined condoms  Other Topics Concern   Not on file  Social History Narrative   Not on file   Social Determinants of Health   Financial Resource Strain: Not on file  Food Insecurity: Not on file  Transportation Needs: Not on file  Physical Activity: Not on file  Stress: Not on file  Social Connections: Not on file  Intimate Partner Violence: Not on file    Family History  Problem Relation Age of Onset    Diabetes Neg Hx    Heart disease Neg Hx    Cancer Neg Hx      Review of Systems  Constitutional: Negative.  Negative for chills and fever.  HENT: Negative.  Negative for congestion and sore throat.   Respiratory: Negative.  Negative for cough and shortness of breath.   Cardiovascular: Negative.  Negative for chest pain and palpitations.  Gastrointestinal:  Negative for abdominal pain, nausea and vomiting.  Genitourinary: Negative.  Negative for dysuria.  Skin:  Positive for rash (Vaginal).  Neurological: Negative.  Negative for dizziness.  All other systems reviewed and are negative.   Vitals:   09/03/22 1107  BP: 136/84  Pulse: 80  Temp: 98 F (36.7 C)  SpO2: 98%    Physical Exam Vitals reviewed.  Constitutional:      Appearance: Normal appearance.  HENT:     Head: Normocephalic.     Mouth/Throat:     Mouth: Mucous membranes are moist.     Pharynx: Oropharynx is clear.  Eyes:     Extraocular Movements: Extraocular movements intact.     Pupils: Pupils are equal, round, and reactive to light.  Cardiovascular:     Rate and Rhythm: Normal rate and regular rhythm.     Pulses: Normal pulses.     Heart sounds: Normal heart sounds.  Pulmonary:     Effort: Pulmonary effort is normal.     Breath sounds: Normal breath sounds.  Abdominal:     Palpations: Abdomen is soft.     Tenderness: There is no abdominal tenderness.  Musculoskeletal:     Cervical back: No tenderness.     Comments: Lower extremities: Warm to touch.  Good peripheral pulses.  Good capillary refill.  Good distal sensation.  Lymphadenopathy:     Cervical: No cervical adenopathy.  Skin:    General: Skin is warm and dry.     Capillary Refill: Capillary refill takes less than 2 seconds.  Neurological:     General: No focal deficit present.     Mental Status: She is alert and oriented to person, place, and time.  Psychiatric:        Mood and Affect: Mood normal.        Behavior: Behavior normal.       ASSESSMENT & PLAN: A total of 44 minutes was spent with the patient and counseling/coordination of care regarding preparing for this visit, review of most recent office visit notes, review of multiple chronic medical conditions under management, review of most recent blood work results, cardiovascular risks associated with hypertension and diabetes, education on nutrition, review of all medications and changes made, prognosis, documentation and need for follow-up.  Problem List Items Addressed This Visit       Cardiovascular and Mediastinum   Hypertension associated with  diabetes (HCC) - Primary    BP Readings from Last 3 Encounters:  09/03/22 136/84  07/20/22 138/80  07/17/22 (!) 140/80  Well-controlled hypertension. Continue amlodipine 5 mg daily and Zestoretic 10-12.5 mg daily Last hemoglobin A1c at 8.1 on 07/17/2022 Recommend to continue metformin 1000 mg twice a day and increase Trulicity to 1.5 mg weekly Cardiovascular risks associated with hypertension and diabetes discussed Diet and nutrition discussed Follow-up in 3 months        Relevant Medications   Dulaglutide (TRULICITY) 1.5 MG/0.5ML SOPN     Endocrine   Dyslipidemia associated with type 2 diabetes mellitus (HCC)    Chronic stable condition Last hemoglobin A1c at 8.1 Continue atorvastatin 20 mg daily Diet and nutrition discussed      Relevant Medications   Dulaglutide (TRULICITY) 1.5 MG/0.5ML SOPN     Other   Human immunodeficiency virus (HIV) disease (HCC)    Clinically stable.  Follows up with ID clinic on a regular basis      Relevant Medications   fluconazole (DIFLUCAN) 150 MG tablet   Chronic pain of lower extremity, bilateral    Pain management discussed Good distal circulation. 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 Eating healthy foods in the right amounts, at about the same times every day, can help you: Manage your blood glucose. Lower your risk of heart disease. Improve your blood pressure. Reach or maintain a healthy weight. What can affect my meal plan? Every person with diabetes is different, and each person has different needs for a meal plan. Your health care provider may recommend that you work with a dietitian to make a meal plan that is best for you. Your meal plan may vary depending on factors such as: The calories you need. The medicines you take. Your weight. Your blood glucose, blood pressure, and cholesterol levels. Your activity level. Other health conditions you have, such as heart or kidney disease. How do carbohydrates affect me? Carbohydrates, also called carbs, affect your blood glucose level more than any other type of food. Eating carbs raises the amount of glucose in your blood. It is important to know how many carbs you can safely have in each meal. This is different for every person. Your dietitian can help you calculate how many carbs you should have at each meal and for each snack. How does alcohol affect me? Alcohol can cause a decrease in blood glucose (hypoglycemia), especially if you use insulin or take certain diabetes medicines by mouth. Hypoglycemia can be a life-threatening condition. Symptoms of hypoglycemia, such as sleepiness, dizziness, and confusion, are similar to symptoms of having too much alcohol. Do not drink alcohol if: Your health care provider tells you not to drink. You are pregnant, may be pregnant, or are planning to become pregnant. If you drink alcohol: Limit how much you have to: 0-1 drink a day for women. 0-2 drinks a day  for men. Know how much alcohol is in your drink. In the [place] one drink equals one 12 oz bottle of beer (355 mL), one 5 oz  glass of wine (148 mL), or one 1 oz glass of hard liquor (44 mL). Keep yourself hydrated with water, diet soda, or unsweetened iced tea. Keep in mind that regular soda, juice, and other mixers may contain a lot of sugar and must be counted as carbs. What are tips for following this plan?  Reading food labels Start by checking the serving size on the Nutrition Facts label of packaged foods and drinks. The number of calories and the amount of carbs, fats, and other nutrients listed on the label are based on one serving of the item. Many items contain more than one serving per package. Check the total grams (g) of carbs in one serving. Check the number of grams of saturated fats and trans fats in one serving. Choose foods that have a low amount or none of these fats. Check the number of milligrams (mg) of salt (sodium) in one serving. Most people should limit total sodium intake to less than 2,300 mg per day. Always check the nutrition information of foods labeled as "low-fat" or "nonfat." These foods may be higher in added sugar or refined carbs and should be avoided. Talk to your dietitian to identify your daily goals for nutrients listed on the label. Shopping Avoid buying canned, pre-made, or processed foods. These foods tend to be high in fat, sodium, and added sugar. Shop around the outside edge of the grocery store. This is where you will most often find fresh fruits and vegetables, bulk grains, fresh meats, and fresh dairy products. Cooking Use low-heat cooking methods, such as baking, instead of high-heat cooking methods, such as deep frying. Cook using healthy oils, such as olive, canola, or sunflower oil. Avoid cooking with butter, cream, or high-fat meats. Meal planning Eat meals and snacks regularly, preferably at the same times every day. Avoid going long periods  of time without eating. Eat foods that are high in fiber, such as fresh fruits, vegetables, beans, and whole grains. Eat 4-6 oz (112-168 g) of lean protein each day, such as lean meat, chicken, fish, eggs, or tofu. One ounce (oz) (28 g) of lean protein is equal to: 1 oz (28 g) of meat, chicken, or fish. 1 egg.  cup (62 g) of tofu. Eat some foods each day that contain healthy fats, such as avocado, nuts, seeds, and fish. What foods should I eat? Fruits Berries. Apples. Oranges. Peaches. Apricots. Plums. Grapes. Mangoes. Papayas. Pomegranates. Kiwi. Cherries. Vegetables Leafy greens, including lettuce, spinach, kale, chard, collard greens, mustard greens, and cabbage. Beets. Cauliflower. Broccoli. Carrots. Green beans. Tomatoes. Peppers. Onions. Cucumbers. Brussels sprouts. Grains Whole grains, such as whole-wheat or whole-grain bread, crackers, tortillas, cereal, and pasta. Unsweetened oatmeal. Quinoa. Brown or wild rice. Meats and other proteins Seafood. Poultry without skin. Lean cuts of poultry and beef. Tofu. Nuts. Seeds. Dairy Low-fat or fat-free dairy products such as milk, yogurt, and cheese. The items listed above may not be a complete list of foods and beverages you can eat and drink. Contact a dietitian for more information. What foods should I avoid? Fruits Fruits canned with syrup. Vegetables Canned vegetables. Frozen vegetables with butter or cream sauce. Grains Refined white flour and flour products such as bread, pasta, snack foods, and cereals. Avoid all processed foods. Meats and other proteins Fatty cuts of meat. Poultry with skin. Breaded or fried meats. Processed meat. Avoid saturated fats. Dairy Full-fat yogurt, cheese, or milk. Beverages Sweetened drinks, such as soda or iced tea. The items listed above may not be  a complete list of foods and beverages you should avoid. Contact a dietitian for more information. Questions to ask a health care provider Do I need  to meet with a certified diabetes care and education specialist? Do I need to meet with a dietitian? What number can I call if I have questions? When are the best times to check my blood glucose? Where to find more information: American Diabetes Association: diabetes.org Academy of Nutrition and Dietetics: eatright.[employer] of Diabetes and Digestive and Kidney Diseases: StageSync.si Association of Diabetes Care & Education Specialists: diabeteseducator.org Summary It is important to have healthy eating habits because your blood sugar (glucose) levels are greatly affected by what you eat and drink. It is important to use alcohol carefully. A healthy meal plan will help you manage your blood glucose and lower your risk of heart disease. Your health care provider may recommend that you work with a dietitian to make a meal plan that is best for you. This information is not intended to replace advice given to you by your health care provider. Make sure you discuss any questions you have with your health care provider. Document Revised: 08/02/2019 Document Reviewed: 08/02/2019 Elsevier Patient Education  2024 Elsevier Inc.      Edwina Barth, MD [facility]

## 2022-09-03 NOTE — Patient Instructions (Signed)

## 2022-09-03 NOTE — Assessment & Plan Note (Signed)
Pain management discussed Good distal circulation. 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

## 2022-09-03 NOTE — Assessment & Plan Note (Signed)
Chronic stable condition Last hemoglobin A1c at 8.1 Continue atorvastatin 20 mg daily Diet and nutrition discussed

## 2022-09-04 ENCOUNTER — Other Ambulatory Visit (HOSPITAL_COMMUNITY): Payer: Self-pay

## 2022-09-17 ENCOUNTER — Telehealth: Payer: Self-pay

## 2022-09-17 ENCOUNTER — Other Ambulatory Visit (HOSPITAL_COMMUNITY): Payer: Self-pay

## 2022-09-17 NOTE — Telephone Encounter (Signed)
Pharmacy Patient Advocate Encounter  Received notification from [employer] that Prior Authorization for Trulicity 1.5mg /0.24ml has been APPROVED from 09/17/22 to 09/16/23. Ran test claim, Copay is $25. This test claim was processed through [facility]- copay amounts may vary at other pharmacies due to pharmacy/plan contracts, or as the patient moves through the different stages of their insurance plan.   PA #/Case ID/Reference #: [id number]

## 2022-09-17 NOTE — Telephone Encounter (Signed)
Pharmacy Patient Advocate Encounter   Received notification from CoverMyMeds that prior authorization for Trulicity 1.5mg /0.53ml is required/requested.   Insurance verification completed.   The patient is insured through [employer] .   Per test claim: PA required; PA submitted to [employer] via CoverMyMeds Key/confirmation #/EOC [id number] Status is pending

## 2022-09-18 ENCOUNTER — Other Ambulatory Visit (HOSPITAL_COMMUNITY): Payer: Self-pay

## 2022-10-09 ENCOUNTER — Other Ambulatory Visit (HOSPITAL_COMMUNITY): Payer: Self-pay

## 2022-10-09 ENCOUNTER — Other Ambulatory Visit: Payer: Self-pay | Admitting: Emergency Medicine

## 2022-10-09 DIAGNOSIS — I10 Essential (primary) hypertension: Secondary | ICD-10-CM

## 2022-10-09 DIAGNOSIS — E785 Hyperlipidemia, unspecified: Secondary | ICD-10-CM

## 2022-10-09 MED ORDER — LISINOPRIL-HYDROCHLOROTHIAZIDE 10-12.5 MG PO TABS
1.0000 | ORAL_TABLET | Freq: Every day | ORAL | 3 refills | Status: DC
Start: 2022-10-09 — End: 2023-03-09
  Filled 2022-10-09: qty 90, 90d supply, fill #0

## 2022-10-09 MED ORDER — ATORVASTATIN CALCIUM 20 MG PO TABS
20.0000 mg | ORAL_TABLET | Freq: Every day | ORAL | 3 refills | Status: DC
Start: 2022-10-09 — End: 2023-05-11
  Filled 2022-10-09: qty 90, 90d supply, fill #0

## 2022-10-09 MED ORDER — AMLODIPINE BESYLATE 5 MG PO TABS
5.0000 mg | ORAL_TABLET | Freq: Every day | ORAL | 3 refills | Status: DC
Start: 2022-10-09 — End: 2023-05-11
  Filled 2022-10-09: qty 90, 90d supply, fill #0

## 2022-10-19 ENCOUNTER — Other Ambulatory Visit (HOSPITAL_COMMUNITY): Payer: Self-pay

## 2022-10-19 NOTE — Progress Notes (Unsigned)
Aleen Sells [name] [address] [place] [address] Phone: [phone]   Assessment and Plan:     There are no diagnoses linked to this encounter.  ***   Pertinent previous records reviewed include ***   Follow Up: ***     Subjective:   I, Katrina Parker, am serving as a Neurosurgeon for Doctor Richardean Sale   Chief Complaint: bilateral knee and low back pain    HPI:    09/10/2021 Patient is a 51 year old female complaining of bilateral knee pain. 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 female who presents to [facility] today for f/u bilat knee pain and DDD of the lumbar spine. Pt was last seen by Dr. Jean Rosenthal on 09/10/21 and was given bilat knee steroid injections and was taught HEP and was advised to use Tylenol. Today, pt reports much relief from prior steroid injections. Pt has cont'd pain, but not as severe. Pt c/o cont'd LBP that is worse when she is standing for work. She is wearing a lumbar support belt when working.    Her main complaint is that her knees are hurting again and she is interested in repeat injections if possible.  She is not ready to consider total knee replacement at this time.   Dx imaging: 09/10/21 L-spine, R & L knee XR             04/11/08 L-spine MRI   07/20/2022 Patient states that she has had pain for a week and some change. Is not able to walk with out pain. Does endorse antalgic gait. Would like bilat injection     10/20/2022 Patient states   Additional pertinent review of systems negative.   Current Outpatient Medications:    amLODipine (NORVASC) 5 MG tablet, Take 1 tablet (5 mg total) by mouth daily., Disp: 90 tablet, Rfl: 3   atorvastatin (LIPITOR) 20 MG tablet, Take 1 tablet (20 mg total) by mouth daily., Disp: 90 tablet, Rfl: 3   Dulaglutide (TRULICITY) 1.5 MG/0.5ML SOPN, Inject 1.5 mg into the skin once a week., Disp: 6 mL, Rfl: 3   fluticasone (FLONASE) 50 MCG/ACT nasal spray, Place into the nose., Disp: , Rfl:    glucose monitoring kit (FREESTYLE) monitoring kit, 1 each by Does not apply route 2 (two) times daily at 8 am and 10 pm., Disp: 1 each, Rfl: 0   lisinopril-hydrochlorothiazide (ZESTORETIC) 10-12.5 MG tablet, Take 1 tablet by mouth daily., Disp: 90 tablet, Rfl: 3   metFORMIN (GLUCOPHAGE) 500 MG tablet, Take 2 tablets (1,000 mg total) by mouth 2 (two) times daily with a meal. For your sugar, Disp: 360 tablet, Rfl: 1   ONE TOUCH ULTRA TEST test strip, Reported on 06/26/2015, Disp: , Rfl: 3   sulfamethoxazole-trimethoprim (BACTRIM DS) 800-160 MG tablet, Take 1 tablet by mouth daily., Disp: 30 tablet, Rfl: 2  Objective:     There were no vitals filed for this visit.    There is no height or weight on file to calculate BMI.    Physical Exam:    ***   Electronically signed by:  Aleen Sells [name] 4:17 PM 10/19/22

## 2022-10-20 ENCOUNTER — Ambulatory Visit: Payer: Commercial Managed Care - HMO | Admitting: Sports Medicine

## 2022-10-20 VITALS — HR 80 | Ht 66.0 in | Wt 196.0 lb

## 2022-10-20 DIAGNOSIS — M170 Bilateral primary osteoarthritis of knee: Secondary | ICD-10-CM

## 2022-10-20 DIAGNOSIS — M79641 Pain in right hand: Secondary | ICD-10-CM | POA: Diagnosis not present

## 2022-10-20 DIAGNOSIS — M25561 Pain in right knee: Secondary | ICD-10-CM

## 2022-10-20 DIAGNOSIS — M25562 Pain in left knee: Secondary | ICD-10-CM | POA: Diagnosis not present

## 2022-10-20 DIAGNOSIS — G8929 Other chronic pain: Secondary | ICD-10-CM

## 2022-10-20 DIAGNOSIS — M79642 Pain in left hand: Secondary | ICD-10-CM

## 2022-10-20 NOTE — Patient Instructions (Addendum)
Recommend Voltaren gel over areas of pain  Zilretta approval placed today Tylenol [phone] mg 2-3 times a day for pain relief  Warm baths for hand pain  3 month follow up

## 2022-10-21 ENCOUNTER — Telehealth: Payer: Self-pay

## 2022-10-21 NOTE — Telephone Encounter (Signed)
Denial for Zilretta. This Drug is a plan exclusion. Her [employer] plan does not cover this medication at all.

## 2022-10-23 NOTE — Telephone Encounter (Signed)
Ran for Durolane. Waiting for insurance approval

## 2022-11-17 ENCOUNTER — Telehealth: Payer: Self-pay

## 2022-11-17 NOTE — Telephone Encounter (Signed)
Called patient to schedule follow up appointment, no answer. Left HIPAA compliant voicemail requesting callback.  ? ?Sandie Ano, RN ? ?

## 2022-11-19 NOTE — Telephone Encounter (Signed)
Received voicemail from patient returning call. Called her back to schedule, no answer. Left HIPAA compliant voicemail requesting callback.   Sandie Ano, RN

## 2022-11-26 ENCOUNTER — Ambulatory Visit: Payer: Managed Care, Other (non HMO) | Admitting: Infectious Diseases

## 2022-11-26 ENCOUNTER — Other Ambulatory Visit (HOSPITAL_COMMUNITY): Payer: Self-pay

## 2022-11-26 ENCOUNTER — Encounter: Payer: Self-pay | Admitting: Emergency Medicine

## 2022-11-26 ENCOUNTER — Ambulatory Visit: Payer: Managed Care, Other (non HMO) | Admitting: Emergency Medicine

## 2022-11-26 VITALS — BP 138/86 | HR 84 | Temp 98.1°F | Ht 64.0 in | Wt 203.0 lb

## 2022-11-26 DIAGNOSIS — E1169 Type 2 diabetes mellitus with other specified complication: Secondary | ICD-10-CM | POA: Diagnosis not present

## 2022-11-26 DIAGNOSIS — I152 Hypertension secondary to endocrine disorders: Secondary | ICD-10-CM

## 2022-11-26 DIAGNOSIS — Z23 Encounter for immunization: Secondary | ICD-10-CM | POA: Diagnosis not present

## 2022-11-26 DIAGNOSIS — B3731 Acute candidiasis of vulva and vagina: Secondary | ICD-10-CM

## 2022-11-26 DIAGNOSIS — Z7984 Long term (current) use of oral hypoglycemic drugs: Secondary | ICD-10-CM

## 2022-11-26 DIAGNOSIS — B20 Human immunodeficiency virus [HIV] disease: Secondary | ICD-10-CM

## 2022-11-26 DIAGNOSIS — E1159 Type 2 diabetes mellitus with other circulatory complications: Secondary | ICD-10-CM

## 2022-11-26 DIAGNOSIS — E785 Hyperlipidemia, unspecified: Secondary | ICD-10-CM | POA: Diagnosis not present

## 2022-11-26 LAB — LIPID PANEL
Cholesterol: 169 mg/dL (ref 0–200)
HDL: 59.2 mg/dL (ref >39.00)
LDL Cholesterol: 97 mg/dL (ref 0–99)
NonHDL: 110.18
Total CHOL/HDL Ratio: 3
Triglycerides: 68 mg/dL (ref 0.0–149.0)
VLDL: 13.6 mg/dL (ref 0.0–40.0)

## 2022-11-26 LAB — COMPREHENSIVE METABOLIC PANEL
ALT: 13 U/L (ref 0–35)
AST: 18 U/L (ref 0–37)
Albumin: 3.9 g/dL (ref 3.5–5.2)
Alkaline Phosphatase: 77 U/L (ref 39–117)
BUN: 16 mg/dL (ref 6–23)
CO2: 30 meq/L (ref 19–32)
Calcium: 9.6 mg/dL (ref 8.4–10.5)
Chloride: 103 meq/L (ref 96–112)
Creatinine, Ser: 0.67 mg/dL (ref 0.40–1.20)
GFR: 98.96 mL/min (ref >60.00)
Glucose, Bld: 100 mg/dL — ABNORMAL HIGH (ref 70–99)
Potassium: 3.9 meq/L (ref 3.5–5.1)
Sodium: 139 meq/L (ref 135–145)
Total Bilirubin: 0.5 mg/dL (ref 0.2–1.2)
Total Protein: 7.7 g/dL (ref 6.0–8.3)

## 2022-11-26 LAB — CBC WITH DIFFERENTIAL/PLATELET
Basophils Absolute: 0 10*3/uL (ref 0.0–0.1)
Basophils Relative: 0.6 % (ref 0.0–3.0)
Eosinophils Absolute: 0.1 10*3/uL (ref 0.0–0.7)
Eosinophils Relative: 2.7 % (ref 0.0–5.0)
HCT: 37 % (ref 36.0–46.0)
Hemoglobin: 12.1 g/dL (ref 12.0–15.0)
Lymphocytes Relative: 40 % (ref 12.0–46.0)
Lymphs Abs: 1.5 10*3/uL (ref 0.7–4.0)
MCHC: 32.8 g/dL (ref 30.0–36.0)
MCV: 88.4 fL (ref 78.0–100.0)
Monocytes Absolute: 0.4 10*3/uL (ref 0.1–1.0)
Monocytes Relative: 10.2 % (ref 3.0–12.0)
Neutro Abs: 1.7 10*3/uL (ref 1.4–7.7)
Neutrophils Relative %: 46.5 % (ref 43.0–77.0)
Platelets: 294 10*3/uL (ref 150.0–400.0)
RBC: 4.18 Mil/uL (ref 3.87–5.11)
RDW: 13.9 % (ref 11.5–15.5)
WBC: 3.6 10*3/uL — ABNORMAL LOW (ref 4.0–10.5)

## 2022-11-26 LAB — POCT GLYCOSYLATED HEMOGLOBIN (HGB A1C): Hemoglobin A1C: 7.1 % — AB (ref 4.0–5.6)

## 2022-11-26 MED ORDER — FLUCONAZOLE 150 MG PO TABS
150.0000 mg | ORAL_TABLET | Freq: Once | ORAL | 0 refills | Status: AC
  Filled 2022-11-26: qty 1, 1d supply, fill #0

## 2022-11-26 NOTE — Patient Instructions (Signed)

## 2022-11-26 NOTE — Progress Notes (Signed)
Katrina Parker 52 y.o.   Chief Complaint  Patient presents with   Medical Management of Chronic Issues    f/u appt, vaginal itching    HISTORY OF PRESENT ILLNESS: This is a 52 y.o. female here for 41-month follow-up of chronic medical conditions including diabetes, hypertension, dyslipidemia. Compliant with medications.  Overall doing well. Still has intermittent vaginal itching. Wt Readings from Last 3 Encounters:  11/26/22 203 lb (92.1 kg)  10/20/22 196 lb (88.9 kg)  09/03/22 194 lb (88 kg)      HPI   Prior to Admission medications   Medication Sig Start Date End Date Taking? Authorizing Provider  amLODipine (NORVASC) 5 MG tablet Take 1 tablet (5 mg total) by mouth daily. 10/09/22  Yes Sheetal Lyall, Eilleen Kempf, MD  atorvastatin (LIPITOR) 20 MG tablet Take 1 tablet (20 mg total) by mouth daily. 10/09/22  Yes Jovonte Commins, Eilleen Kempf, MD  Dulaglutide (TRULICITY) 1.5 MG/0.5ML SOPN Inject 1.5 mg into the skin once a week. 09/03/22  Yes Fauna Neuner, Eilleen Kempf, MD  fluticasone [facility]) 50 MCG/ACT nasal spray Place into the nose. 10/20/17  Yes [provider]  glucose monitoring kit (FREESTYLE) monitoring kit 1 each by Does not apply route 2 (two) times daily at 8 am and 10 pm. 09/14/13  Yes Massie Maroon, FNP  lisinopril-hydrochlorothiazide (ZESTORETIC) 10-12.5 MG tablet Take 1 tablet by mouth daily. 10/09/22  Yes Shreyansh Tiffany, Eilleen Kempf, MD  metFORMIN (GLUCOPHAGE) 500 MG tablet Take 2 tablets (1,000 mg total) by mouth 2 (two) times daily with a meal. For your sugar 07/17/22  Yes Pincus Sanes, MD  ONE TOUCH ULTRA TEST test strip Reported on 06/26/2015 11/03/13  Yes [provider]  sulfamethoxazole-trimethoprim (BACTRIM DS) 800-160 MG tablet Take 1 tablet by mouth daily. Patient not taking: Reported on 11/26/2022 07/22/22   Jennette Kettle, RPH-CPP    Allergies  Allergen Reactions   Marcelline Deist [Dapagliflozin] Rash   Ibuprofen Rash    Patient Active Problem List    Diagnosis Date Noted   Dyslipidemia associated with type 2 diabetes mellitus (HCC) 02/18/2021   Hypertension associated with diabetes (HCC) 10/10/2020   Seasonal allergic rhinitis due to pollen 10/20/2017   Diabetes type 2, uncontrolled 09/13/2013   Obesity 09/13/2013   History of uterine fibroid 09/13/2013   Essential hypertension, benign 02/22/2012   Chronic pain of lower extremity, bilateral 02/22/2012   Ventral hernia 12/24/2006   Human immunodeficiency virus (HIV) disease (HCC) 09/29/2006    Past Medical History:  Diagnosis Date   Diabetes mellitus without complication (HCC)    HIV (human immunodeficiency virus infection) (HCC)     Past Surgical History:  Procedure Laterality Date   OTHER SURGICAL HISTORY     2 abdominal surgeries (1 in [place], 1 in [place]; unsure kind of procedure, states there might have been a "tumor")    Social History   Socioeconomic History   Marital status: Widowed    Spouse name: Not on file   Number of children: Not on file   Years of education: Not on file   Highest education level: Not on file  Occupational History   Not on file  Tobacco Use   Smoking status: Never   Smokeless tobacco: Never  Substance and Sexual Activity   Alcohol use: No    Alcohol/week: 0.0 standard drinks of alcohol   Drug use: No   Sexual activity: Not Currently    Partners: Male    Comment: declined condoms  Other Topics Concern  Not on file  Social History Narrative   Not on file   Social Determinants of Health   Financial Resource Strain: Not on file  Food Insecurity: Not on file  Transportation Needs: Not on file  Physical Activity: Not on file  Stress: Not on file  Social Connections: Not on file  Intimate Partner Violence: Not on file    Family History  Problem Relation Age of Onset   Diabetes Neg Hx    Heart disease Neg Hx    Cancer Neg Hx      Review of Systems  Constitutional: Negative.  Negative for chills and fever.  HENT: Negative.   Negative for congestion and sore throat.   Respiratory: Negative.  Negative for cough and shortness of breath.   Cardiovascular: Negative.  Negative for chest pain and palpitations.  Gastrointestinal:  Negative for abdominal pain, diarrhea, nausea and vomiting.  Genitourinary: Negative.  Negative for dysuria and hematuria.       Vaginal itching  Skin: Negative.  Negative for rash.  Neurological: Negative.  Negative for dizziness and headaches.  All other systems reviewed and are negative.   Vitals:   11/26/22 1049  BP: 138/86  Pulse: 84  Temp: 98.1 F (36.7 C)  SpO2: 98%    Physical Exam Vitals reviewed.  Constitutional:      Appearance: Normal appearance.  HENT:     Head: Normocephalic.     Mouth/Throat:     Mouth: Mucous membranes are moist.     Pharynx: Oropharynx is clear.  Eyes:     Extraocular Movements: Extraocular movements intact.     Pupils: Pupils are equal, round, and reactive to light.  Cardiovascular:     Rate and Rhythm: Normal rate and regular rhythm.     Pulses: Normal pulses.     Heart sounds: Normal heart sounds.  Pulmonary:     Effort: Pulmonary effort is normal.     Breath sounds: Normal breath sounds.  Abdominal:     Palpations: Abdomen is soft.     Tenderness: There is no abdominal tenderness.  Musculoskeletal:     Cervical back: No tenderness.  Lymphadenopathy:     Cervical: No cervical adenopathy.  Skin:    General: Skin is warm and dry.  Neurological:     Mental Status: She is alert and oriented to person, place, and time.  Psychiatric:        Mood and Affect: Mood normal.        Behavior: Behavior normal.    Results for orders placed or performed in visit on 11/26/22 (from the past 24 hour(s))  POCT HgB A1C     Status: Abnormal   Collection Time: 11/26/22 11:24 AM  Result Value Ref Range   Hemoglobin A1C 7.1 (A) 4.0 - 5.6 %   HbA1c POC (<> result, manual entry)     HbA1c, POC (prediabetic range)     HbA1c, POC (controlled  diabetic range)       ASSESSMENT & PLAN: A total of 46 minutes was spent with the patient and counseling/coordination of care regarding preparing for this visit, review of most recent office visit notes, review of multiple chronic medical conditions under management, review of all medications, review of most recent blood work results including interpretation of today's hemoglobin A1c, education on nutrition, cardiovascular risks associated with hypertension and diabetes, prognosis, documentation, and need for follow-up.  Problem List Items Addressed This Visit       Cardiovascular and Mediastinum  Hypertension associated with diabetes (HCC) - Primary    BP Readings from Last 3 Encounters:  11/26/22 138/86  09/03/22 136/84  07/20/22 138/80  Well-controlled hypertension Continue amlodipine 5 mg and Zestoretic 10-12.5 mg daily Much improved diabetes with hemoglobin A1c down to 7.1. Continue weekly Trulicity 1.5 mg and daily metformin 1000 mg twice a day Cardiovascular risks associated with hypertension and diabetes discussed Diet and nutrition discussed Blood work done today Follow-up in 3 months       Relevant Orders   POCT HgB A1C   Urine Microalbumin w/creat. ratio   CBC with Differential/Platelet   Comprehensive metabolic panel   Lipid panel     Endocrine   Dyslipidemia associated with type 2 diabetes mellitus (HCC)    Much improved diabetes with hemoglobin A1c of 7.1 Continue daily metformin and weekly Trulicity Continue atorvastatin 20 mg daily Diet and nutrition discussed Lipid profile done today Follow-up in 3 months      Relevant Orders   POCT HgB A1C   CBC with Differential/Platelet   Comprehensive metabolic panel   Lipid panel     Other   Human immunodeficiency virus (HIV) disease (HCC)    Stable without complications.  Follows up with ID clinic on a regular basis.      Relevant Medications   fluconazole (DIFLUCAN) 150 MG tablet   Other Visit  Diagnoses     Need for vaccination       Relevant Orders   Flu vaccine trivalent PF, 6mos and older(Flulaval,Afluria,Fluarix,Fluzone)   Yeast vaginitis       Relevant Medications   fluconazole (DIFLUCAN) 150 MG tablet      Patient Instructions  Diabetes Mellitus and Nutrition, Adult When you have diabetes, or diabetes mellitus, it is very important to have healthy eating habits because your blood sugar (glucose) levels are greatly affected by what you eat and drink. Eating healthy foods in the right amounts, at about the same times every day, can help you: Manage your blood glucose. Lower your risk of heart disease. Improve your blood pressure. Reach or maintain a healthy weight. What can affect my meal plan? Every person with diabetes is different, and each person has different needs for a meal plan. Your health care provider may recommend that you work with a dietitian to make a meal plan that is best for you. Your meal plan may vary depending on factors such as: The calories you need. The medicines you take. Your weight. Your blood glucose, blood pressure, and cholesterol levels. Your activity level. Other health conditions you have, such as heart or kidney disease. How do carbohydrates affect me? Carbohydrates, also called carbs, affect your blood glucose level more than any other type of food. Eating carbs raises the amount of glucose in your blood. It is important to know how many carbs you can safely have in each meal. This is different for every person. Your dietitian can help you calculate how many carbs you should have at each meal and for each snack. How does alcohol affect me? Alcohol can cause a decrease in blood glucose (hypoglycemia), especially if you use insulin or take certain diabetes medicines by mouth. Hypoglycemia can be a life-threatening condition. Symptoms of hypoglycemia, such as sleepiness, dizziness, and confusion, are similar to symptoms of having too much  alcohol. Do not drink alcohol if: Your health care provider tells you not to drink. You are pregnant, may be pregnant, or are planning to become pregnant. If you drink alcohol: Limit how  much you have to: 0-1 drink a day for women. 0-2 drinks a day for men. Know how much alcohol is in your drink. In the [place] one drink equals one 12 oz bottle of beer (355 mL), one 5 oz glass of wine (148 mL), or one 1 oz glass of hard liquor (44 mL). Keep yourself hydrated with water, diet soda, or unsweetened iced tea. Keep in mind that regular soda, juice, and other mixers may contain a lot of sugar and must be counted as carbs. What are tips for following this plan?  Reading food labels Start by checking the serving size on the Nutrition Facts label of packaged foods and drinks. The number of calories and the amount of carbs, fats, and other nutrients listed on the label are based on one serving of the item. Many items contain more than one serving per package. Check the total grams (g) of carbs in one serving. Check the number of grams of saturated fats and trans fats in one serving. Choose foods that have a low amount or none of these fats. Check the number of milligrams (mg) of salt (sodium) in one serving. Most people should limit total sodium intake to less than 2,300 mg per day. Always check the nutrition information of foods labeled as "low-fat" or "nonfat." These foods may be higher in added sugar or refined carbs and should be avoided. Talk to your dietitian to identify your daily goals for nutrients listed on the label. Shopping Avoid buying canned, pre-made, or processed foods. These foods tend to be high in fat, sodium, and added sugar. Shop around the outside edge of the grocery store. This is where you will most often find fresh fruits and vegetables, bulk grains, fresh meats, and fresh dairy products. Cooking Use low-heat cooking methods, such as baking, instead of high-heat cooking methods,  such as deep frying. Cook using healthy oils, such as olive, canola, or sunflower oil. Avoid cooking with butter, cream, or high-fat meats. Meal planning Eat meals and snacks regularly, preferably at the same times every day. Avoid going long periods of time without eating. Eat foods that are high in fiber, such as fresh fruits, vegetables, beans, and whole grains. Eat 4-6 oz (112-168 g) of lean protein each day, such as lean meat, chicken, fish, eggs, or tofu. One ounce (oz) (28 g) of lean protein is equal to: 1 oz (28 g) of meat, chicken, or fish. 1 egg.  cup (62 g) of tofu. Eat some foods each day that contain healthy fats, such as avocado, nuts, seeds, and fish. What foods should I eat? Fruits Berries. Apples. Oranges. Peaches. Apricots. Plums. Grapes. Mangoes. Papayas. Pomegranates. Kiwi. Cherries. Vegetables Leafy greens, including lettuce, spinach, kale, chard, collard greens, mustard greens, and cabbage. Beets. Cauliflower. Broccoli. Carrots. Green beans. Tomatoes. Peppers. Onions. Cucumbers. Brussels sprouts. Grains Whole grains, such as whole-wheat or whole-grain bread, crackers, tortillas, cereal, and pasta. Unsweetened oatmeal. Quinoa. Brown or wild rice. Meats and other proteins Seafood. Poultry without skin. Lean cuts of poultry and beef. Tofu. Nuts. Seeds. Dairy Low-fat or fat-free dairy products such as milk, yogurt, and cheese. The items listed above may not be a complete list of foods and beverages you can eat and drink. Contact a dietitian for more information. What foods should I avoid? Fruits Fruits canned with syrup. Vegetables Canned vegetables. Frozen vegetables with butter or cream sauce. Grains Refined white flour and flour products such as bread, pasta, snack foods, and cereals. Avoid all processed foods. Meats  and other proteins Fatty cuts of meat. Poultry with skin. Breaded or fried meats. Processed meat. Avoid saturated fats. Dairy Full-fat yogurt,  cheese, or milk. Beverages Sweetened drinks, such as soda or iced tea. The items listed above may not be a complete list of foods and beverages you should avoid. Contact a dietitian for more information. Questions to ask a health care provider Do I need to meet with a certified diabetes care and education specialist? Do I need to meet with a dietitian? What number can I call if I have questions? When are the best times to check my blood glucose? Where to find more information: American Diabetes Association: diabetes.org Academy of Nutrition and Dietetics: eatright.[employer] of Diabetes and Digestive and Kidney Diseases: StageSync.si Association of Diabetes Care & Education Specialists: diabeteseducator.org Summary It is important to have healthy eating habits because your blood sugar (glucose) levels are greatly affected by what you eat and drink. It is important to use alcohol carefully. A healthy meal plan will help you manage your blood glucose and lower your risk of heart disease. Your health care provider may recommend that you work with a dietitian to make a meal plan that is best for you. This information is not intended to replace advice given to you by your health care provider. Make sure you discuss any questions you have with your health care provider. Document Revised: 08/02/2019 Document Reviewed: 08/02/2019 Elsevier Patient Education  2024 Elsevier Inc.     Edwina Barth, MD [facility]

## 2022-11-26 NOTE — Assessment & Plan Note (Signed)
Much improved diabetes with hemoglobin A1c of 7.1 Continue daily metformin and weekly Trulicity Continue atorvastatin 20 mg daily Diet and nutrition discussed Lipid profile done today Follow-up in 3 months

## 2022-11-26 NOTE — Assessment & Plan Note (Signed)
Stable without complications.  Follows up with ID clinic on a regular basis.

## 2022-11-26 NOTE — Assessment & Plan Note (Signed)
BP Readings from Last 3 Encounters:  11/26/22 138/86  09/03/22 136/84  07/20/22 138/80  Well-controlled hypertension Continue amlodipine 5 mg and Zestoretic 10-12.5 mg daily Much improved diabetes with hemoglobin A1c down to 7.1. Continue weekly Trulicity 1.5 mg and daily metformin 1000 mg twice a day Cardiovascular risks associated with hypertension and diabetes discussed Diet and nutrition discussed Blood work done today Follow-up in 3 months

## 2022-11-27 LAB — MICROALBUMIN / CREATININE URINE RATIO
Creatinine,U: 94.6 mg/dL
Microalb Creat Ratio: 1.5 mg/g (ref 0.0–30.0)
Microalb, Ur: 1.4 mg/dL (ref 0.0–1.9)

## 2022-11-30 ENCOUNTER — Ambulatory Visit: Payer: Managed Care, Other (non HMO) | Admitting: Infectious Diseases

## 2022-12-07 ENCOUNTER — Other Ambulatory Visit (HOSPITAL_COMMUNITY): Payer: Self-pay

## 2022-12-18 ENCOUNTER — Encounter: Payer: Self-pay | Admitting: Radiology

## 2022-12-24 ENCOUNTER — Other Ambulatory Visit (HOSPITAL_COMMUNITY): Payer: Self-pay

## 2022-12-24 ENCOUNTER — Ambulatory Visit (INDEPENDENT_AMBULATORY_CARE_PROVIDER_SITE_OTHER): Payer: Commercial Managed Care - HMO | Admitting: Infectious Diseases

## 2022-12-24 ENCOUNTER — Other Ambulatory Visit: Payer: Self-pay

## 2022-12-24 VITALS — BP 192/107 | HR 84 | Temp 97.4°F | Resp 16 | Wt 199.8 lb

## 2022-12-24 DIAGNOSIS — Z5941 Food insecurity: Secondary | ICD-10-CM | POA: Diagnosis not present

## 2022-12-24 DIAGNOSIS — M13862 Other specified arthritis, left knee: Secondary | ICD-10-CM | POA: Diagnosis not present

## 2022-12-24 DIAGNOSIS — Z23 Encounter for immunization: Secondary | ICD-10-CM

## 2022-12-24 DIAGNOSIS — B20 Human immunodeficiency virus [HIV] disease: Secondary | ICD-10-CM

## 2022-12-24 DIAGNOSIS — E119 Type 2 diabetes mellitus without complications: Secondary | ICD-10-CM

## 2022-12-24 MED ORDER — BIKTARVY 50-200-25 MG PO TABS
1.0000 | ORAL_TABLET | Freq: Every day | ORAL | 11 refills | Status: DC
  Filled 2022-12-25: qty 30, 30d supply, fill #0
  Filled 2023-01-25: qty 30, 30d supply, fill #1
  Filled 2023-03-18 (×2): qty 30, 30d supply, fill #2
  Filled 2023-04-12: qty 30, 30d supply, fill #3

## 2022-12-24 MED ORDER — SULFAMETHOXAZOLE-TRIMETHOPRIM 800-160 MG PO TABS
1.0000 | ORAL_TABLET | ORAL | 5 refills | Status: DC
  Filled 2022-12-24: qty 30, 70d supply, fill #0

## 2022-12-24 NOTE — Progress Notes (Signed)
Name: Katrina Parker  DOB: Apr 04, 1970 MRN: [id number] PCP: Georgina Quint, MD    Brief Narrative:  Katrina Parker is a 52 y.o. female with HIV, Stage 3, diagnosed 2008 after move from [place]. CD4 nadir 170 (recently)  VL unknown Transmission Risk: sexual  History of OIs: none History of STIs: Hep B sAg (- 2008), sAb (+ 2008), cAb (+ 2008); Hep A (+), Hep C (- 2008) Quantiferon (- 2010)   Previous Regimens: Atripla Jarold Motto 2017 Biktarvy 2024  Genotypes: 2024    Subjective   Subjective:   Chief Complaint  Patient presents with   Follow-up    B20      Discussed the use of AI scribe software for clinical note transcription with the patient, who gave verbal consent to proceed.  History of Present Illness   Katrina Parker is here to get back on HIV medication. She is originally from [place]. History of HIV/AIDS+, hypertension, diabetes, and arthritis, presents with worsening bilateral knee pain, more severe in the left knee. She describes the pain as severe and reports that over-the-counter Aleve provides some relief, but causes stomach upset and dizziness. She has tried Tylenol, but found it ineffective for her left knee specifically. She has previously received injections for the knee pain, which initially provided relief but have since become ineffective. The patient has been advised about potential knee replacement surgery but has expressed reluctance due to previous poor support and financial concerns. She now has Medicaid.   The patient also reports difficulty adhering to her medication regimen, including her HIV and hypertension medications, due to lack of food. She expresses that taking these medications without food exacerbates her dizziness. She has food stamps but finds that the cost of food is prohibitive to keep a full supply of readily accessible food. She has not slept at all over night and has not had any of her medications today due to lack of food.  She has some dizziness today but no headaches or other symptoms.   The patient also reports a recent burn on her finger from work, which has been causing her discomfort. She has been self-treating the burn with rubbing alcohol, but it remains open and painful.  The patient's social situation is challenging, with reported isolation and lack of support from family. She expresses significant distress related to her social situation, which may be contributing to her overall health status. She is not working with [employer]. 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 She reports that her knee pain has been so severe that it has caused her to miss work.      Saw our ID pharmacy team in July 2024 after no follow up since 2018 Katrina Parker). CD4 was 172. She had not taken any HIV medications since 2015 due to medicine fatigue.  She works on a [employer]      12/24/2022   10:00 AM  Depression screen PHQ 2/9  Decreased Interest 0  Down, Depressed, Hopeless 0  PHQ - 2 Score 0    Review of Systems  Constitutional:  Negative for chills, fever, malaise/fatigue and weight loss.  HENT:  Negative for sore throat.        No dental problems  Respiratory:  Negative for cough and sputum production.   Cardiovascular:  Negative for chest pain and leg swelling.  Gastrointestinal:  Negative for abdominal pain, diarrhea and vomiting.  Genitourinary:  Negative for dysuria  and flank pain.  Musculoskeletal:  Positive for joint pain. Negative for myalgias and neck pain.  Skin:  Negative for rash.  Neurological:  Negative for dizziness, tingling and headaches.  Psychiatric/Behavioral:  Positive for depression. Negative for substance abuse. The patient is not nervous/anxious and does not have insomnia.      Past Medical History:  Diagnosis Date   Diabetes mellitus without complication (HCC)    HIV (human immunodeficiency virus infection)  (HCC)     Outpatient Medications Prior to Visit  Medication Sig Dispense Refill   amLODipine (NORVASC) 5 MG tablet Take 1 tablet (5 mg total) by mouth daily. 90 tablet 3   atorvastatin (LIPITOR) 20 MG tablet Take 1 tablet (20 mg total) by mouth daily. 90 tablet 3   fluticasone (FLONASE) 50 MCG/ACT nasal spray Place into the nose.     glucose monitoring kit (FREESTYLE) monitoring kit 1 each by Does not apply route 2 (two) times daily at 8 am and 10 pm. 1 each 0   lisinopril-hydrochlorothiazide (ZESTORETIC) 10-12.5 MG tablet Take 1 tablet by mouth daily. 90 tablet 3   metFORMIN (GLUCOPHAGE) 500 MG tablet Take 2 tablets (1,000 mg total) by mouth 2 (two) times daily with a meal. For your sugar 360 tablet 1   ONE TOUCH ULTRA TEST test strip Reported on 06/26/2015  3   bictegravir-emtricitabine-tenofovir AF (BIKTARVY) 50-200-25 MG TABS tablet Take 1 tablet by mouth daily.     Dulaglutide (TRULICITY) 1.5 MG/0.5ML SOPN Inject 1.5 mg into the skin once a week. (Patient not taking: Reported on 12/24/2022) 6 mL 3   sulfamethoxazole-trimethoprim (BACTRIM DS) 800-160 MG tablet Take 1 tablet by mouth daily. (Patient not taking: Reported on 12/24/2022) 30 tablet 2   No facility-administered medications prior to visit.     Allergies  Allergen Reactions   Farxiga [Dapagliflozin] Rash   Ibuprofen Rash    Social History   Tobacco Use   Smoking status: Never   Smokeless tobacco: Never  Substance Use Topics   Alcohol use: No    Alcohol/week: 0.0 standard drinks of alcohol   Drug use: No    Family History  Problem Relation Age of Onset   Diabetes Neg Hx    Heart disease Neg Hx    Cancer Neg Hx     Social History   Substance and Sexual Activity  Sexual Activity Not Currently   Partners: Male   Comment: declined condoms        Objective   Objective:   Vitals:   12/24/22 0956 12/24/22 1031  BP: (!) 187/119 (!) 192/107  Pulse: 84   Resp: 16   Temp: (!) 97.4 F (36.3 C)    TempSrc: Temporal   SpO2: 97%   Weight: 199 lb 12.8 oz (90.6 kg)    Body mass index is 34.3 kg/m.  Physical Exam Vitals reviewed.  Constitutional:      Appearance: Normal appearance. She is not ill-appearing.  HENT:     Mouth/Throat:     Mouth: Mucous membranes are moist.     Pharynx: Oropharynx is clear.  Eyes:     General: No scleral icterus. Cardiovascular:     Rate and Rhythm: Normal rate and regular rhythm.  Pulmonary:     Effort: Pulmonary effort is normal.     Comments: No shortness of breath detected in conversation.  Skin:    General: Skin is warm and dry.     Capillary Refill: Capillary refill takes less than 2 seconds.  Comments: Right index finger with open fissure around flaking scar.   Neurological:     Mental Status: She is oriented to person, place, and time.  Psychiatric:        Mood and Affect: Mood normal.        Behavior: Behavior normal.        Thought Content: Thought content normal.        Judgment: Judgment normal.       Assessment & Plan:    Osteoarthritis of the Knees, Severe -  Severe pain in both knees, more so in the left. Previous injections provided temporary relief. Patient has been self-medicating with Aleve, which causes stomach upset and dizziness. Patient has been advised to consider knee replacement surgery but is hesitant due to previous surgical experiences and financial concerns. I explained that her Aleve is likely contributing to worse blood pressure and encouraged her to use more regular tylenol for pain control. I provided her with a bottle from our clinic supply today.  -Refer to orthopedic surgeon for further evaluation and discussion of potential surgical intervention. -Tylenol 1,000 mg three times a day as needed preferred.  -Continue use with cane   HIV, AIDS +  Patient is currently on Biktarvy and Bactrim for prophylaxis with last CD4 count 172. We went over results today. Genotype w/o any concern for resistance.  Reports no side effects but struggles with adherence due to lack of food.  -Continue Biktarvy daily  -Continue Bactrim 1 DS tab 3x a week.  -Refer to case management for assistance with food security and adherence.  -Check CD4 count and viral load today. -Prevnar 20  -Will discuss pap smear at future visits, her knee pain prohibits her getting on exam table at present.  -On statin for primary prevention with HTN, T2DM risk factors   Hypertension Patient reports not taking medication due to lack of food.  -Case Management referral for food pantry support -Stop Aleve / Ibuprofen and use tylenol only  -Encourage consistent medication adherence when food is available. -Follows with primary care   Diabetes Blood sugars appear to be under control with A1C 7%  -Continue current management per PCP   General Health Maintenance -Administer pneumonia vaccine today. -Follow-up in 3 months.         Orders Placed This Encounter  Procedures   HIV 1 RNA quant-no reflex-bld   T-helper cells (CD4) count   AMB REFERRAL TO [facility]    Referral Priority:   Routine    Referral Type:   Community Service    Number of Visits Requested:   1    Meds ordered this encounter  Medications   bictegravir-emtricitabine-tenofovir AF (BIKTARVY) 50-200-25 MG TABS tablet    Sig: Take 1 tablet by mouth daily.    Dispense:  30 tablet    Refill:  11   sulfamethoxazole-trimethoprim (BACTRIM DS) 800-160 MG tablet    Sig: Take 1 tablet by mouth 3 (three) times a week.    Dispense:  30 tablet    Refill:  5    Return in about 3 months (around 03/24/2023).  Total encounter time spent 64 minutes including 34 minutes face to face time spent and the rest in professional time spent reviewing the medical record of last pertinent notes pertaining to today's visit.    Rexene Alberts, MSN, NP-C [facility] for Infectious Disease [facility]  [email or web address] Pager:  [phone] Office: [phone] [facility] Main Line: [phone] *Secure Chat Communication Welcome

## 2022-12-24 NOTE — Patient Instructions (Addendum)
I would like for you to start working with [employer] - they should be able to help you with some more food and assistance with bills and other needs you may have.   I would like for you to continue taking your Biktarvy and Bactrim for now. We will update blood work for you today.   I think you may need to have your left knee replaced to help with the pain - for the pain I would prefer you to take the tylenol I gave you - two tablets up to 3 times a day (morning, afternoon and before bed) as you may need for pain.    I would like to see you back in 3 months to see how you are doing again and make sure your biktarvy is working well.

## 2022-12-25 ENCOUNTER — Other Ambulatory Visit: Payer: Self-pay

## 2022-12-25 ENCOUNTER — Other Ambulatory Visit (HOSPITAL_COMMUNITY): Payer: Self-pay

## 2022-12-25 ENCOUNTER — Other Ambulatory Visit: Payer: Self-pay | Admitting: Pharmacist

## 2022-12-25 LAB — T-HELPER CELLS (CD4) COUNT (NOT AT ARMC)
CD4 % Helper T Cell: 10 % — ABNORMAL LOW (ref 33–65)
CD4 T Cell Abs: 137 /uL — ABNORMAL LOW (ref 400–1790)

## 2022-12-25 NOTE — Progress Notes (Signed)
Specialty Pharmacy Initial Fill Coordination Note  Katrina Parker is a 52 y.o. female contacted today regarding initial fill of specialty medication(s) Bictegravir-Emtricitab-Tenofov [employer])   Patient requested Delivery   Delivery date: 12/29/22   Verified address: [address] [name] [address]   Medication will be filled on 12/28/22.   Patient is aware of $4 copayment.

## 2022-12-25 NOTE — Progress Notes (Signed)
Specialty Pharmacy Initiation Note   Amani SHADELL STEWART is a 52 y.o. female who will be followed by the specialty pharmacy service for RxSp HIV    Review of administration, indication, effectiveness, safety, potential side effects, storage/disposable, and missed dose instructions occurred today for patient's specialty medication(s) Bictegravir-Emtricitab-Tenofov [facility])     Patient/Caregiver did not have any additional questions or concerns.   Patient's therapy is appropriate to: Continue    Goals Addressed             This Visit's Progress    Achieve Undetectable HIV Viral Load < 20       Patient is not on track and no change. Patient will work on increased adherence      Comply with lab assessments       Patient is not on track and improving. Patient will adhere to provider and/or lab appointments      Increase CD4 count until steady state       Patient is not on track and no change. Patient will work on increased adherence      Maintain optimal adherence to therapy       Patient is not on track and improving. Patient will work on increased adherence         Jennette Kettle Specialty Pharmacist

## 2022-12-27 LAB — HIV-1 RNA QUANT-NO REFLEX-BLD
HIV 1 RNA Quant: 6760 {copies}/mL — ABNORMAL HIGH
HIV-1 RNA Quant, Log: 3.83 {Log} — ABNORMAL HIGH

## 2023-01-01 ENCOUNTER — Other Ambulatory Visit (HOSPITAL_COMMUNITY): Payer: Self-pay

## 2023-01-12 ENCOUNTER — Other Ambulatory Visit: Payer: Self-pay

## 2023-01-18 ENCOUNTER — Ambulatory Visit: Payer: Commercial Managed Care - HMO | Admitting: Family Medicine

## 2023-01-19 ENCOUNTER — Other Ambulatory Visit: Payer: Self-pay

## 2023-01-20 ENCOUNTER — Other Ambulatory Visit: Payer: Self-pay

## 2023-01-20 NOTE — Progress Notes (Deleted)
 [facility] [address] [place] [address] Phone: [phone]   Assessment and Plan:     There are no diagnoses linked to this encounter.  ***   Pertinent previous records reviewed include ***    Follow Up: ***     Subjective:   I, Katrina Parker, am serving as a neurosurgeon for Doctor Morene Mace   Chief Complaint: bilateral knee and low back pain    HPI:    09/10/2021 Patient is a 53 year old female complaining of bilateral knee pain. 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 female who presents to [facility] today for f/u bilat knee pain and DDD of the lumbar spine. Pt was last seen by Dr. Mace on 09/10/21 and was given bilat knee steroid injections and was taught HEP and was advised to use Tylenol . Today, pt reports much relief from prior steroid injections. Pt has cont'd pain, but not as severe. Pt c/o cont'd LBP that is worse when she is standing for work. She is wearing a lumbar support belt when working.    Her main complaint is that her knees are hurting again and she is interested in repeat injections if possible.  She is not ready to consider total knee replacement at this time.   Dx imaging: 09/10/21 L-spine, R & L knee XR             04/11/08 L-spine MRI   07/20/2022 Patient states that she has had pain for a week and some change. Is not able to walk with out pain. Does endorse antalgic gait. Would like bilat injection     10/20/2022 Patient states bilateral knee pain . CSI lasted 3 months but the pain came back worse. She is only able to work 3 days a week now sometimes her hands get stiff   01/21/2023 Patient states   Additional pertinent review of systems negative.   Current Outpatient Medications:    amLODipine  (NORVASC ) 5 MG tablet, Take 1 tablet (5 mg total) by mouth daily., Disp: 90 tablet, Rfl: 3   atorvastatin  (LIPITOR) 20 MG tablet, Take 1 tablet (20 mg total) by mouth daily., Disp: 90 tablet, Rfl: 3   bictegravir-emtricitabine -tenofovir  AF (BIKTARVY ) 50-200-25 MG TABS tablet, Take 1 tablet by mouth daily., Disp: 30 tablet, Rfl: 11   Dulaglutide  (TRULICITY ) 1.5 MG/0.5ML SOAJ, Inject 1.5 mg into the skin once a week. (Patient not taking: Reported on 12/24/2022), Disp: 6 mL, Rfl: 3   fluticasone (FLONASE) 50 MCG/ACT nasal spray, Place into the nose., Disp: , Rfl:    glucose monitoring kit (FREESTYLE) monitoring kit, 1 each by Does not apply route 2 (two) times daily at 8 am and 10 pm., Disp: 1 each, Rfl: 0   lisinopril -hydrochlorothiazide  (ZESTORETIC ) 10-12.5 MG tablet, Take 1 tablet by mouth daily., Disp: 90 tablet, Rfl: 3   metFORMIN  (GLUCOPHAGE )  500 MG tablet, Take 2 tablets (1,000 mg total) by mouth 2 (two) times daily with a meal. For your sugar, Disp: 360 tablet, Rfl: 1   ONE TOUCH ULTRA TEST test strip, Reported on 06/26/2015, Disp: , Rfl: 3   sulfamethoxazole -trimethoprim  (BACTRIM  DS) 800-160 MG tablet, Take 1 tablet by mouth 3 (three) times a week., Disp: 30 tablet, Rfl: 5   Objective:     There were no vitals filed for this visit.    There is no height or weight on file to calculate BMI.    Physical Exam:    ***   Electronically signed by:  Odis Mace D.CLEMENTEEN [facility] 7:26 AM 01/20/23

## 2023-01-21 ENCOUNTER — Ambulatory Visit: Payer: Commercial Managed Care - HMO | Admitting: Sports Medicine

## 2023-01-22 ENCOUNTER — Other Ambulatory Visit: Payer: Self-pay

## 2023-01-25 ENCOUNTER — Other Ambulatory Visit: Payer: Self-pay

## 2023-01-25 ENCOUNTER — Other Ambulatory Visit (HOSPITAL_COMMUNITY): Payer: Self-pay

## 2023-01-25 NOTE — Progress Notes (Signed)
 Specialty Pharmacy Refill Coordination Note  Katrina Parker is a 53 y.o. female contacted today regarding refills of specialty medication(s) Bictegravir-Emtricitab-Tenofov (Biktarvy )   Patient requested [name] at [facility] [place] date: 01/25/23   Medication will be filled on 01/25/23.

## 2023-01-26 ENCOUNTER — Other Ambulatory Visit (HOSPITAL_COMMUNITY): Payer: Self-pay

## 2023-02-01 ENCOUNTER — Ambulatory Visit: Payer: Commercial Managed Care - HMO | Admitting: Family Medicine

## 2023-02-11 ENCOUNTER — Other Ambulatory Visit: Payer: Self-pay

## 2023-03-04 ENCOUNTER — Ambulatory Visit: Payer: Managed Care, Other (non HMO) | Admitting: Emergency Medicine

## 2023-03-09 ENCOUNTER — Other Ambulatory Visit (HOSPITAL_COMMUNITY): Payer: Self-pay

## 2023-03-09 ENCOUNTER — Encounter: Payer: Self-pay | Admitting: Emergency Medicine

## 2023-03-09 ENCOUNTER — Other Ambulatory Visit: Payer: Self-pay

## 2023-03-09 ENCOUNTER — Ambulatory Visit (INDEPENDENT_AMBULATORY_CARE_PROVIDER_SITE_OTHER): Payer: Medicaid Other | Admitting: Emergency Medicine

## 2023-03-09 VITALS — BP 152/96 | HR 97 | Temp 98.8°F | Ht 64.0 in | Wt 207.0 lb

## 2023-03-09 DIAGNOSIS — E1165 Type 2 diabetes mellitus with hyperglycemia: Secondary | ICD-10-CM

## 2023-03-09 DIAGNOSIS — B20 Human immunodeficiency virus [HIV] disease: Secondary | ICD-10-CM

## 2023-03-09 DIAGNOSIS — M79604 Pain in right leg: Secondary | ICD-10-CM

## 2023-03-09 DIAGNOSIS — G8929 Other chronic pain: Secondary | ICD-10-CM

## 2023-03-09 DIAGNOSIS — M79605 Pain in left leg: Secondary | ICD-10-CM

## 2023-03-09 DIAGNOSIS — Z7984 Long term (current) use of oral hypoglycemic drugs: Secondary | ICD-10-CM

## 2023-03-09 DIAGNOSIS — E1159 Type 2 diabetes mellitus with other circulatory complications: Secondary | ICD-10-CM

## 2023-03-09 DIAGNOSIS — E1169 Type 2 diabetes mellitus with other specified complication: Secondary | ICD-10-CM

## 2023-03-09 DIAGNOSIS — I152 Hypertension secondary to endocrine disorders: Secondary | ICD-10-CM

## 2023-03-09 DIAGNOSIS — E785 Hyperlipidemia, unspecified: Secondary | ICD-10-CM

## 2023-03-09 LAB — POCT GLYCOSYLATED HEMOGLOBIN (HGB A1C): Hemoglobin A1C: 6.3 % — AB (ref 4.0–5.6)

## 2023-03-09 MED ORDER — LISINOPRIL-HYDROCHLOROTHIAZIDE 20-12.5 MG PO TABS
1.0000 | ORAL_TABLET | Freq: Every day | ORAL | 3 refills | Status: DC
  Filled 2023-03-09: qty 90, 90d supply, fill #0

## 2023-03-09 NOTE — Assessment & Plan Note (Signed)
 Much improved diabetes with hemoglobin A1c of 6.3 Continue daily metformin and weekly Trulicity Continue atorvastatin 20 mg daily Diet and nutrition discussed Follow-up in 6 months

## 2023-03-09 NOTE — Assessment & Plan Note (Signed)
 Stable without complications.  Follows up with ID clinic on a regular basis.

## 2023-03-09 NOTE — Assessment & Plan Note (Signed)
 Elevated blood pressure reading in the office today BP Readings from Last 3 Encounters:  03/09/23 (!) 152/96  12/24/22 (!) 192/107  11/26/22 138/86  Recommend to continue amlodipine 5 mg and increase Zestoretic HCT to 20-12.5 mg daily Well-controlled diabetes with hemoglobin A1c better than before 6.3 Much improved diabetes with hemoglobin A1c down to 7.1. Continue weekly Trulicity 1.5 mg and daily metformin 1000 mg twice a day Cardiovascular risks associated with hypertension and diabetes discussed Diet and nutrition discussed Blood work done today Follow-up in 6 months

## 2023-03-09 NOTE — Assessment & Plan Note (Signed)
 Pain management discussed Good distal circulation. 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

## 2023-03-09 NOTE — Patient Instructions (Signed)
 Hypertension, Adult High blood pressure (hypertension) is when the force of blood pumping through the arteries is too strong. The arteries are the blood vessels that carry blood from the heart throughout the body. Hypertension forces the heart to work harder to pump blood and may cause arteries to become narrow or stiff. Untreated or uncontrolled hypertension can lead to a heart attack, heart failure, a stroke, kidney disease, and other problems. A blood pressure reading consists of a higher number over a lower number. Ideally, your blood pressure should be below 120/80. The first ("top") number is called the systolic pressure. It is a measure of the pressure in your arteries as your heart beats. The second ("bottom") number is called the diastolic pressure. It is a measure of the pressure in your arteries as the heart relaxes. What are the causes? The exact cause of this condition is not known. There are some conditions that result in high blood pressure. What increases the risk? Certain factors may make you more likely to develop high blood pressure. Some of these risk factors are under your control, including: Smoking. Not getting enough exercise or physical activity. Being overweight. Having too much fat, sugar, calories, or salt (sodium) in your diet. Drinking too much alcohol. Other risk factors include: Having a personal history of heart disease, diabetes, high cholesterol, or kidney disease. Stress. Having a family history of high blood pressure and high cholesterol. Having obstructive sleep apnea. Age. The risk increases with age. What are the signs or symptoms? High blood pressure may not cause symptoms. Very high blood pressure (hypertensive crisis) may cause: Headache. Fast or irregular heartbeats (palpitations). Shortness of breath. Nosebleed. Nausea and vomiting. Vision changes. Severe chest pain, dizziness, and seizures. How is this diagnosed? This condition is diagnosed by  measuring your blood pressure while you are seated, with your arm resting on a flat surface, your legs uncrossed, and your feet flat on the floor. The cuff of the blood pressure monitor will be placed directly against the skin of your upper arm at the level of your heart. Blood pressure should be measured at least twice using the same arm. Certain conditions can cause a difference in blood pressure between your right and left arms. If you have a high blood pressure reading during one visit or you have normal blood pressure with other risk factors, you may be asked to: Return on a different day to have your blood pressure checked again. Monitor your blood pressure at home for 1 week or longer. If you are diagnosed with hypertension, you may have other blood or imaging tests to help your health care provider understand your overall risk for other conditions. How is this treated? This condition is treated by making healthy lifestyle changes, such as eating healthy foods, exercising more, and reducing your alcohol intake. You may be referred for counseling on a healthy diet and physical activity. Your health care provider may prescribe medicine if lifestyle changes are not enough to get your blood pressure under control and if: Your systolic blood pressure is above 130. Your diastolic blood pressure is above 80. Your personal target blood pressure may vary depending on your medical conditions, your age, and other factors. Follow these instructions at home: Eating and drinking  Eat a diet that is high in fiber and potassium, and low in sodium, added sugar, and fat. An example of this eating plan is called the DASH diet. DASH stands for Dietary Approaches to Stop Hypertension. To eat this way: Eat  plenty of fresh fruits and vegetables. Try to fill one half of your plate at each meal with fruits and vegetables. Eat whole grains, such as whole-wheat pasta, brown rice, or whole-grain bread. Fill about one  fourth of your plate with whole grains. Eat or drink low-fat dairy products, such as skim milk or low-fat yogurt. Avoid fatty cuts of meat, processed or cured meats, and poultry with skin. Fill about one fourth of your plate with lean proteins, such as fish, chicken without skin, beans, eggs, or tofu. Avoid pre-made and processed foods. These tend to be higher in sodium, added sugar, and fat. Reduce your daily sodium intake. Many people with hypertension should eat less than 1,500 mg of sodium a day. Do not drink alcohol if: Your health care provider tells you not to drink. You are pregnant, may be pregnant, or are planning to become pregnant. If you drink alcohol: Limit how much you have to: 0-1 drink a day for women. 0-2 drinks a day for men. Know how much alcohol is in your drink. In the [place] one drink equals one 12 oz bottle of beer (355 mL), one 5 oz glass of wine (148 mL), or one 1 oz glass of hard liquor (44 mL). Lifestyle  Work with your health care provider to maintain a healthy body weight or to lose weight. Ask what an ideal weight is for you. Get at least 30 minutes of exercise that causes your heart to beat faster (aerobic exercise) most days of the week. Activities may include walking, swimming, or biking. Include exercise to strengthen your muscles (resistance exercise), such as Pilates or lifting weights, as part of your weekly exercise routine. Try to do these types of exercises for 30 minutes at least 3 days a week. Do not use any products that contain nicotine or tobacco. These products include cigarettes, chewing tobacco, and vaping devices, such as e-cigarettes. If you need help quitting, ask your health care provider. Monitor your blood pressure at home as told by your health care provider. Keep all follow-up visits. This is important. Medicines Take over-the-counter and prescription medicines only as told by your health care provider. Follow directions carefully. Blood  pressure medicines must be taken as prescribed. Do not skip doses of blood pressure medicine. Doing this puts you at risk for problems and can make the medicine less effective. Ask your health care provider about side effects or reactions to medicines that you should watch for. Contact a health care provider if you: Think you are having a reaction to a medicine you are taking. Have headaches that keep coming back (recurring). Feel dizzy. Have swelling in your ankles. Have trouble with your vision. Get help right away if you: Develop a severe headache or confusion. Have unusual weakness or numbness. Feel faint. Have severe pain in your chest or abdomen. Vomit repeatedly. Have trouble breathing. These symptoms may be an emergency. Get help right away. Call 911. Do not wait to see if the symptoms will go away. Do not drive yourself to the hospital. Summary Hypertension is when the force of blood pumping through your arteries is too strong. If this condition is not controlled, it may put you at risk for serious complications. Your personal target blood pressure may vary depending on your medical conditions, your age, and other factors. For most people, a normal blood pressure is less than 120/80. Hypertension is treated with lifestyle changes, medicines, or a combination of both. Lifestyle changes include losing weight, eating a healthy,  low-sodium diet, exercising more, and limiting alcohol. This information is not intended to replace advice given to you by your health care provider. Make sure you discuss any questions you have with your health care provider. Document Revised: 11/05/2020 Document Reviewed: 11/05/2020 Elsevier Patient Education  2024 [employer].

## 2023-03-09 NOTE — Progress Notes (Signed)
 Katrina Parker 53 y.o.   Chief Complaint  Patient presents with   Follow-up    3 month f/u for DM. Patient states on 12/17/2022 she fell at work and still having trouble walking. She is having swelling and pain . Tried to apply for disability and got denied. They did tell her she needed surgery but did not get because she was scared.     HISTORY OF PRESENT ILLNESS: This is a 53 y.o. female here for 76-month follow-up of diabetes, hypertension and dyslipidemia Has chronic pain to both knees.  Fell at work on 12/17/2022.  Walks with a cane. Lab Results  Component Value Date   HGBA1C 7.1 (A) 11/26/2022   BP Readings from Last 3 Encounters:  03/09/23 (!) 152/96  12/24/22 (!) 192/107  11/26/22 138/86   Wt Readings from Last 3 Encounters:  03/09/23 207 lb (93.9 kg)  12/24/22 199 lb 12.8 oz (90.6 kg)  11/26/22 203 lb (92.1 kg)     HPI   Prior to Admission medications   Medication Sig Start Date End Date Taking? Authorizing Provider  amLODipine (NORVASC) 5 MG tablet Take 1 tablet (5 mg total) by mouth daily. 10/09/22  Yes Fardowsa Authier, Eilleen Kempf, MD  atorvastatin (LIPITOR) 20 MG tablet Take 1 tablet (20 mg total) by mouth daily. 10/09/22  Yes Lamaria Hildebrandt, Eilleen Kempf, MD  bictegravir-emtricitabine-tenofovir AF (BIKTARVY) 50-200-25 MG TABS tablet Take 1 tablet by mouth daily. 12/24/22  Yes Blanchard Kelch, NP  fluticasone (FLONASE) 50 MCG/ACT nasal spray Place into the nose. 10/20/17  Yes [provider]  glucose monitoring kit (FREESTYLE) monitoring kit 1 each by Does not apply route 2 (two) times daily at 8 am and 10 pm. 09/14/13  Yes Massie Maroon, FNP  lisinopril-hydrochlorothiazide (ZESTORETIC) 10-12.5 MG tablet Take 1 tablet by mouth daily. 10/09/22  Yes Mckenleigh Tarlton, Eilleen Kempf, MD  metFORMIN (GLUCOPHAGE) 500 MG tablet Take 2 tablets (1,000 mg total) by mouth 2 (two) times daily with a meal. For your sugar 07/17/22  Yes Pincus Sanes, MD  ONE TOUCH ULTRA TEST test strip Reported on  06/26/2015 11/03/13  Yes [provider]  sulfamethoxazole-trimethoprim (BACTRIM DS) 800-160 MG tablet Take 1 tablet by mouth 3 (three) times a week. 12/25/22  Yes Blanchard Kelch, NP  Dulaglutide (TRULICITY) 1.5 MG/0.5ML SOAJ Inject 1.5 mg into the skin once a week. Patient not taking: Reported on 03/09/2023 09/03/22   Georgina Quint, MD    Allergies  Allergen Reactions   Marcelline Deist [Dapagliflozin] Rash   Ibuprofen Rash    Patient Active Problem List   Diagnosis Date Noted   Dyslipidemia associated with type 2 diabetes mellitus (HCC) 02/18/2021   Hypertension associated with diabetes (HCC) 10/10/2020   Seasonal allergic rhinitis due to pollen 10/20/2017   Type 2 diabetes mellitus without complications (HCC) 09/13/2013   Obesity 09/13/2013   History of uterine fibroid 09/13/2013   Essential hypertension, benign 02/22/2012   Chronic pain of lower extremity, bilateral 02/22/2012   Ventral hernia 12/24/2006   Human immunodeficiency virus (HIV) disease (HCC) 09/29/2006    Past Medical History:  Diagnosis Date   Diabetes mellitus without complication (HCC)    HIV (human immunodeficiency virus infection) (HCC)     Past Surgical History:  Procedure Laterality Date   OTHER SURGICAL HISTORY     2 abdominal surgeries (1 in [place], 1 in [place]; unsure kind of procedure, states there might have been a "tumor")    Social History   Socioeconomic History  Marital status: Widowed    Spouse name: Not on file   Number of children: Not on file   Years of education: Not on file   Highest education level: Not on file  Occupational History   Not on file  Tobacco Use   Smoking status: Never   Smokeless tobacco: Never  Substance and Sexual Activity   Alcohol use: No    Alcohol/week: 0.0 standard drinks of alcohol   Drug use: No   Sexual activity: Not Currently    Partners: Male    Comment: declined condoms  Other Topics Concern   Not on file  Social History Narrative    Not on file   Social Drivers of Health   Financial Resource Strain: Not on file  Food Insecurity: Not on file  Transportation Needs: Not on file  Physical Activity: Not on file  Stress: Not on file  Social Connections: Not on file  Intimate Partner Violence: Not on file    Family History  Problem Relation Age of Onset   Diabetes Neg Hx    Heart disease Neg Hx    Cancer Neg Hx      Review of Systems  Constitutional: Negative.  Negative for chills and fever.  HENT: Negative.  Negative for congestion and sore throat.   Respiratory: Negative.  Negative for cough and shortness of breath.   Cardiovascular: Negative.  Negative for chest pain and palpitations.  Gastrointestinal:  Negative for abdominal pain, diarrhea, nausea and vomiting.  Genitourinary: Negative.  Negative for dysuria and hematuria.  Musculoskeletal:  Positive for back pain and joint pain.  Skin: Negative.   Neurological: Negative.  Negative for dizziness and headaches.  All other systems reviewed and are negative.   Vitals:   03/09/23 1044  BP: (!) 152/96  Pulse: 97  Temp: 98.8 F (37.1 C)  SpO2: 98%    Physical Exam Vitals reviewed.  Constitutional:      Appearance: Normal appearance.  HENT:     Head: Normocephalic.  Eyes:     Extraocular Movements: Extraocular movements intact.     Pupils: Pupils are equal, round, and reactive to light.  Cardiovascular:     Rate and Rhythm: Normal rate and regular rhythm.     Pulses: Normal pulses.     Heart sounds: Normal heart sounds.  Pulmonary:     Effort: Pulmonary effort is normal.     Breath sounds: Normal breath sounds.  Musculoskeletal:     Cervical back: No tenderness.  Lymphadenopathy:     Cervical: No cervical adenopathy.  Skin:    General: Skin is warm and dry.  Neurological:     Mental Status: She is alert and oriented to person, place, and time.  Psychiatric:        Mood and Affect: Mood normal.        Behavior: Behavior normal.     Results for orders placed or performed in visit on 03/09/23 (from the past 24 hours)  POCT HgB A1C     Status: Abnormal   Collection Time: 03/09/23 11:17 AM  Result Value Ref Range   Hemoglobin A1C 6.3 (A) 4.0 - 5.6 %   HbA1c POC (<> result, manual entry)     HbA1c, POC (prediabetic range)     HbA1c, POC (controlled diabetic range)       ASSESSMENT & PLAN:  A total of 44 minutes was spent with the patient and counseling/coordination of care regarding preparing for this visit, review of most recent  office visit notes, review of multiple chronic medical conditions and their management, cardiovascular risks associated with diabetes and hypertension, review of all medications, review of most recent bloodwork results including interpretation of today's hemoglobin A1c, review of health maintenance items, education on nutrition, prognosis, documentation, and need for follow up.   Problem List Items Addressed This Visit       Cardiovascular and Mediastinum   Hypertension associated with diabetes (HCC) - Primary   Elevated blood pressure reading in the office today BP Readings from Last 3 Encounters:  03/09/23 (!) 152/96  12/24/22 (!) 192/107  11/26/22 138/86  Recommend to continue amlodipine 5 mg and increase Zestoretic HCT to 20-12.5 mg daily Well-controlled diabetes with hemoglobin A1c better than before 6.3 Much improved diabetes with hemoglobin A1c down to 7.1. Continue weekly Trulicity 1.5 mg and daily metformin 1000 mg twice a day Cardiovascular risks associated with hypertension and diabetes discussed Diet and nutrition discussed Blood work done today Follow-up in 6 months       Relevant Medications   lisinopril-hydrochlorothiazide (ZESTORETIC) 20-12.5 MG tablet     Endocrine   Dyslipidemia associated with type 2 diabetes mellitus (HCC)   Much improved diabetes with hemoglobin A1c of 6.3 Continue daily metformin and weekly Trulicity Continue atorvastatin 20 mg  daily Diet and nutrition discussed Follow-up in 6 months      Relevant Medications   lisinopril-hydrochlorothiazide (ZESTORETIC) 20-12.5 MG tablet     Other   Human immunodeficiency virus (HIV) disease (HCC)   Stable without complications. Follows up with ID clinic on a regular basis.       Chronic pain of lower extremity, bilateral   Pain management discussed Good distal circulation. 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 The arteries are the blood vessels that carry blood from the heart throughout the body. Hypertension forces the heart to work harder to pump blood and may cause arteries to become narrow or stiff. Untreated or uncontrolled hypertension can lead to a heart attack, heart failure, a stroke, kidney disease, and other problems. A blood pressure reading consists of a higher number over a lower number. Ideally, your blood pressure should be below 120/80. The first ("top") number is called the systolic pressure. It is a measure of the pressure in your arteries as your heart beats. The second ("bottom") number is called the diastolic pressure. It is a measure of the pressure in your arteries as the heart relaxes. What are the causes? The exact cause of this condition is not known. There are some conditions that result in high blood pressure. What increases the risk? Certain factors may make you more likely to develop high blood pressure. Some of these risk factors are under your control, including: Smoking. Not getting enough exercise  or physical activity. Being overweight. Having too much fat, sugar, calories, or salt (sodium) in your diet. Drinking too much alcohol. Other risk factors include: Having a personal history of heart disease, diabetes, high cholesterol, or kidney disease. Stress. Having a family history of high blood  pressure and high cholesterol. Having obstructive sleep apnea. Age. The risk increases with age. What are the signs or symptoms? High blood pressure may not cause symptoms. Very high blood pressure (hypertensive crisis) may cause: Headache. Fast or irregular heartbeats (palpitations). Shortness of breath. Nosebleed. Nausea and vomiting. Vision changes. Severe chest pain, dizziness, and seizures. How is this diagnosed? This condition is diagnosed by measuring your blood pressure while you are seated, with your arm resting on a flat surface, your legs uncrossed, and your feet flat on the floor. The cuff of the blood pressure monitor will be placed directly against the skin of your upper arm at the level of your heart. Blood pressure should be measured at least twice using the same arm. Certain conditions can cause a difference in blood pressure between your right and left arms. If you have a high blood pressure reading during one visit or you have normal blood pressure with other risk factors, you may be asked to: Return on a different day to have your blood pressure checked again. Monitor your blood pressure at home for 1 week or longer. If you are diagnosed with hypertension, you may have other blood or imaging tests to help your health care provider understand your overall risk for other conditions. How is this treated? This condition is treated by making healthy lifestyle changes, such as eating healthy foods, exercising more, and reducing your alcohol intake. You may be referred for counseling on a healthy diet and physical activity. Your health care provider may prescribe medicine if  lifestyle changes are not enough to get your blood pressure under control and if: Your systolic blood pressure is above 130. Your diastolic blood pressure is above 80. Your personal target blood pressure may vary depending on your medical conditions, your age, and other factors. Follow these instructions at home: Eating and drinking  Eat a diet that is high in fiber and potassium, and low in sodium, added sugar, and fat. An example of this eating plan is called the DASH diet. DASH stands for Dietary Approaches to Stop Hypertension. To eat this way: Eat plenty of fresh fruits and vegetables. Try to fill one half of your plate at each meal with fruits and vegetables. Eat whole grains, such as whole-wheat pasta, brown rice, or whole-grain bread. Fill about one fourth of your plate with whole grains. Eat or drink low-fat dairy products, such as skim milk or low-fat yogurt. Avoid fatty cuts of meat, processed or cured meats, and poultry with skin. Fill about one fourth of your plate with lean proteins, such as fish, chicken without skin, beans, eggs, or tofu. Avoid pre-made and processed foods. These tend to be higher in sodium, added sugar, and fat. Reduce your daily sodium intake. Many people with hypertension should eat less than 1,500 mg of sodium a day. Do not drink alcohol if: Your health care provider tells you not to drink. You are pregnant, may be pregnant, or are planning to become pregnant. If you drink alcohol: Limit how much you have to: 0-1 drink a day for women. 0-2 drinks a day for men. Know how much alcohol is in your drink. In the [place] one drink equals one 12 oz bottle of beer (355 mL), one 5 oz glass of wine (148 mL), or one 1 oz glass of hard liquor (44 mL). Lifestyle  Work with your health care provider to maintain a healthy body weight or to lose weight. Ask what an ideal weight is for you. Get  at least 30 minutes of exercise that causes your heart to beat faster (aerobic  exercise) most days of the week. Activities may include walking, swimming, or biking. Include exercise to strengthen your muscles (resistance exercise), such as Pilates or lifting weights, as part of your weekly exercise routine. Try to do these types of exercises for 30 minutes at least 3 days a week. Do not use any products that contain nicotine or tobacco. These products include cigarettes, chewing tobacco, and vaping devices, such as e-cigarettes. If you need help quitting, ask your health care provider. Monitor your blood pressure at home as told by your health care provider. Keep all follow-up visits. This is important. Medicines Take over-the-counter and prescription medicines only as told by your health care provider. Follow directions carefully. Blood pressure medicines must be taken as prescribed. Do not skip doses of blood pressure medicine. Doing this puts you at risk for problems and can make the medicine less effective. Ask your health care provider about side effects or reactions to medicines that you should watch for. Contact a health care provider if you: Think you are having a reaction to a medicine you are taking. Have headaches that keep coming back (recurring). Feel dizzy. Have swelling in your ankles. Have trouble with your vision. Get help right away if you: Develop a severe headache or confusion. Have unusual weakness or numbness. Feel faint. Have severe pain in your chest or abdomen. Vomit repeatedly. Have trouble breathing. These symptoms may be an emergency. Get help right away. Call 911. Do not wait to see if the symptoms will go away. Do not drive yourself to the hospital. Summary Hypertension is when the force of blood pumping through your arteries is too strong. If this condition is not controlled, it may put you at risk for serious complications. Your personal target blood pressure may vary depending on your medical conditions, your age, and other factors.  For most people, a normal blood pressure is less than 120/80. Hypertension is treated with lifestyle changes, medicines, or a combination of both. Lifestyle changes include losing weight, eating a healthy, low-sodium diet, exercising more, and limiting alcohol. This information is not intended to replace advice given to you by your health care provider. Make sure you discuss any questions you have with your health care provider. Document Revised: 11/05/2020 Document Reviewed: 11/05/2020 Elsevier Patient Education  2024 [employer]    Edwina Barth, MD [facility]

## 2023-03-16 ENCOUNTER — Other Ambulatory Visit: Payer: Self-pay

## 2023-03-16 ENCOUNTER — Other Ambulatory Visit (HOSPITAL_COMMUNITY): Payer: Self-pay

## 2023-03-18 ENCOUNTER — Other Ambulatory Visit (HOSPITAL_COMMUNITY): Payer: Self-pay

## 2023-03-18 ENCOUNTER — Encounter: Payer: Self-pay | Admitting: Infectious Diseases

## 2023-03-18 ENCOUNTER — Other Ambulatory Visit: Payer: Self-pay

## 2023-03-18 ENCOUNTER — Ambulatory Visit (INDEPENDENT_AMBULATORY_CARE_PROVIDER_SITE_OTHER): Payer: Medicaid Other | Admitting: Infectious Diseases

## 2023-03-18 VITALS — BP 173/98 | HR 91 | Resp 16 | Ht 64.0 in | Wt 210.0 lb

## 2023-03-18 DIAGNOSIS — R519 Headache, unspecified: Secondary | ICD-10-CM

## 2023-03-18 DIAGNOSIS — B20 Human immunodeficiency virus [HIV] disease: Secondary | ICD-10-CM | POA: Diagnosis present

## 2023-03-18 DIAGNOSIS — Z5941 Food insecurity: Secondary | ICD-10-CM | POA: Diagnosis not present

## 2023-03-18 DIAGNOSIS — I10 Essential (primary) hypertension: Secondary | ICD-10-CM | POA: Diagnosis not present

## 2023-03-18 NOTE — Progress Notes (Signed)
 Name: Katrina Parker  DOB: 09-25-1970 MRN: [id number] PCP: Georgina Quint, MD    Brief Narrative:  Katrina Parker is a 53 y.o. female with HIV, Stage 3, diagnosed 2008 after move from [place]. CD4 nadir 170 (recently)  VL unknown Transmission Risk: sexual  History of OIs: none History of STIs: Hep B sAg (- 2008), sAb (+ 2008), cAb (+ 2008); Hep A (+), Hep C (- 2008) Quantiferon (- 2010)   Previous Regimens: Atripla Jarold Motto 2017 Biktarvy 2024  Genotypes: 2024    Subjective   Subjective:   Chief Complaint  Patient presents with   Follow-up    Headache      Discussed the use of AI scribe software for clinical note transcription with the patient, who gave verbal consent to proceed.  History of Present Illness   Katrina Parker is a 53 year old female who presents for HIV care.   She has been experiencing chronic headaches since February, located at the back and front of her head. The headaches have persisted without significant relief but not necessarily severe enough to need treatment for them. She uses glasses (bifocals) and does not think she has had any changes with vision. She can see words but cannot necessarily read.   She consumes a significant amount of coffee, typically one sachet mixed with milk. She is uncertain if her caffeine consumption is contributing to her symptoms.  She has a history of elevated blood pressure, which could contribute to her headaches. She has not taken her blood pressure medication today, as she prefers to take it with food. She experiences dizziness, which she associates with not taking her medication on an empty stomach. She takes her medication daily but sometimes skips a day to manage her supply, ensuring she has enough until her next refill. She currently has six pills remaining and plans to visit the pharmacy for a refill. She has difficulty driving due to leg issues and relies on others for transportation to the  pharmacy.  She has difficulty reading messages and prefers phone calls for communication regarding her appointments and medication refills.          03/18/2023   10:08 AM  Depression screen PHQ 2/9  Decreased Interest 0  Down, Depressed, Hopeless 0  PHQ - 2 Score 0    Review of Systems  Constitutional:  Negative for chills, fever, malaise/fatigue and weight loss.  HENT:  Negative for sore throat.        No dental problems  Respiratory:  Negative for cough and sputum production.   Cardiovascular:  Negative for chest pain and leg swelling.  Gastrointestinal:  Negative for abdominal pain, diarrhea and vomiting.  Genitourinary:  Negative for dysuria and flank pain.  Musculoskeletal:  Positive for joint pain. Negative for myalgias and neck pain.  Skin:  Negative for rash.  Neurological:  Negative for dizziness, tingling and headaches.  Psychiatric/Behavioral:  Positive for depression. Negative for substance abuse. The patient is not nervous/anxious and does not have insomnia.      Past Medical History:  Diagnosis Date   Diabetes mellitus without complication (HCC)    HIV (human immunodeficiency virus infection) (HCC)     Outpatient Medications Prior to Visit  Medication Sig Dispense Refill   amLODipine (NORVASC) 5 MG tablet Take 1 tablet (5 mg total) by mouth daily. 90 tablet 3   atorvastatin (LIPITOR) 20 MG tablet Take 1 tablet (20 mg total) by mouth daily. 90 tablet 3  bictegravir-emtricitabine-tenofovir AF (BIKTARVY) 50-200-25 MG TABS tablet Take 1 tablet by mouth daily. 30 tablet 11   fluticasone (FLONASE) 50 MCG/ACT nasal spray Place into the nose.     glucose monitoring kit (FREESTYLE) monitoring kit 1 each by Does not apply route 2 (two) times daily at 8 am and 10 pm. 1 each 0   lisinopril-hydrochlorothiazide (ZESTORETIC) 20-12.5 MG tablet Take 1 tablet by mouth daily. 90 tablet 3   metFORMIN (GLUCOPHAGE) 500 MG tablet Take 2 tablets (1,000 mg total) by mouth 2 (two)  times daily with a meal. For your sugar 360 tablet 1   ONE TOUCH ULTRA TEST test strip Reported on 06/26/2015  3   sulfamethoxazole-trimethoprim (BACTRIM DS) 800-160 MG tablet Take 1 tablet by mouth 3 (three) times a week. 30 tablet 5   Dulaglutide (TRULICITY) 1.5 MG/0.5ML SOAJ Inject 1.5 mg into the skin once a week. (Patient not taking: Reported on 12/24/2022) 6 mL 3   No facility-administered medications prior to visit.     Allergies  Allergen Reactions   Farxiga [Dapagliflozin] Rash   Ibuprofen Rash    Social History   Tobacco Use   Smoking status: Never   Smokeless tobacco: Never  Substance Use Topics   Alcohol use: No    Alcohol/week: 0.0 standard drinks of alcohol   Drug use: No    Family History  Problem Relation Age of Onset   Diabetes Neg Hx    Heart disease Neg Hx    Cancer Neg Hx     Social History   Substance and Sexual Activity  Sexual Activity Not Currently   Partners: Male   Comment: declined condoms        Objective   Objective:   Vitals:   03/18/23 1009  BP: (!) 173/98  Pulse: 91  Resp: 16  Weight: 210 lb (95.3 kg)  Height: 5\' 4"  (1.626 m)   Body mass index is 36.05 kg/m.  Physical Exam Vitals reviewed.  Constitutional:      Appearance: Normal appearance. She is not ill-appearing.  HENT:     Mouth/Throat:     Mouth: Mucous membranes are moist.     Pharynx: Oropharynx is clear.  Eyes:     General: No scleral icterus. Cardiovascular:     Rate and Rhythm: Normal rate and regular rhythm.  Pulmonary:     Effort: Pulmonary effort is normal.     Comments: No shortness of breath detected in conversation.  Skin:    General: Skin is warm and dry.     Capillary Refill: Capillary refill takes less than 2 seconds.     Comments: Right index finger with open fissure around flaking scar.   Neurological:     Mental Status: She is oriented to person, place, and time.  Psychiatric:        Mood and Affect: Mood normal.        Behavior:  Behavior normal.        Thought Content: Thought content normal.        Judgment: Judgment normal.       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 She has taken it everyday since i saw her last - sometimes with delay in dose but no misses to what she ascribes. Flu vaccine provided already.  - Will get her viral load and CD4 remeasured today.  - Continue bactrim for now - Work with  [facility] pharmacy to mail and blister package her medications.  - RTC in 3 months    Headache - Chronic headache since February, possibly due to caffeine withdrawal vs Biktarvy side effect vs hypertension. - Recheck blood pressure before she leaves. - Discuss potential impact of caffeine on headaches and hypertension. - I asked her to try to take her medication prior to her appointments so her medicines can be adjusted without concerns for over treatment.  - Consider vision evaluation to ensure no changes in prescription.   Hypertension - Elevated blood pressure - may be contributing to headaches. Medication adherence affected by transportation issues. - Recheck blood pressure before she leaves. - Ensure she understands the importance of daily medication adherence. - Coordinate with pharmacy to mail medications and package them in blister packs.  Medication Management - Difficulty obtaining medications due to transportation issues. Prefers phone communication for reminders. She was not interested in research trial with CROWN study as she does not want to consider injections.  - Coordinate with pharmacy to mail medications and package them in blister packs. - Ensure pharmacy contacts her via phone for appointment reminders.  Follow-up - Follow-up needed to monitor condition and medication effectiveness. - Schedule follow-up appointment in June. - Update blood work to assess immune system and medication effectiveness.         Orders Placed This  Encounter  Procedures   HIV 1 RNA quant-no reflex-bld   T-helper cells (CD4) count   COMPLETE METABOLIC PANEL WITH GFR    No orders of the defined types were placed in this encounter.   Return in about 3 months (around 06/18/2023).    Rexene Alberts, MSN, NP-C [facility]  [email or web address] Pager: [phone] Office: [phone] [facility] Main Line: [phone] *Secure Chat Communication Welcome

## 2023-03-18 NOTE — Patient Instructions (Addendum)
 Continue your Biktarvy one pill every day   Continue your Bactrim - one pill everyday   Please take your blood pressure medications when you get home after you get something to eat.   Will work on having all your medications mailed to you in packets to make it easier to take.

## 2023-03-18 NOTE — Progress Notes (Signed)
 Specialty Pharmacy Ongoing Clinical Assessment Note  Katrina Parker is a 53 y.o. female who is being followed by the specialty pharmacy service for RxSp HIV   Patient's specialty medication(s) reviewed today: Bictegravir-Emtricitab-Tenofov (Biktarvy)   Missed doses in the last 4 weeks: 0   Patient/Caregiver did not have any additional questions or concerns.   Therapeutic benefit summary: Patient is achieving benefit   Adverse events/side effects summary: No adverse events/side effects   Patient's therapy is appropriate to: Continue    Goals Addressed             This Visit's Progress    Achieve Undetectable HIV Viral Load < 20   Worsening    Patient is not on track and worsening. Patient will work on increased adherence.  While the patient denies missed doses, the refill history indicates otherwise.  Viral load is higher at 6,760 copies/mL as of 12/24/22, new labs were collected today and we are awaiting results.          Follow up:  3 months  Servando Snare Specialty Pharmacist

## 2023-03-18 NOTE — Progress Notes (Signed)
 Specialty Pharmacy Refill Coordination Note  Katrina Parker is a 53 y.o. female contacted today regarding refills of specialty medication(s) Bictegravir-Emtricitab-Tenofov Musician)   Patient requested Delivery   Delivery date: 03/22/23   Verified address: [address] [place] [address]   Medication will be filled on 03/19/23.

## 2023-03-19 ENCOUNTER — Other Ambulatory Visit (HOSPITAL_COMMUNITY): Payer: Self-pay

## 2023-03-19 ENCOUNTER — Other Ambulatory Visit: Payer: Self-pay

## 2023-03-19 LAB — T-HELPER CELLS (CD4) COUNT (NOT AT ARMC)
CD4 % Helper T Cell: 13 % — ABNORMAL LOW (ref 33–65)
CD4 T Cell Abs: 179 /uL — ABNORMAL LOW (ref 400–1790)

## 2023-03-19 MED ORDER — TIZANIDINE HCL 4 MG PO TABS
4.0000 mg | ORAL_TABLET | Freq: Three times a day (TID) | ORAL | 0 refills | Status: AC | PRN
  Filled 2023-03-19: qty 30, 10d supply, fill #0

## 2023-03-19 MED ORDER — DICLOFENAC SODIUM 1 % EX GEL
CUTANEOUS | 0 refills | Status: DC
Start: 2023-03-19 — End: 2023-11-26
  Filled 2023-03-19: qty 100, 7d supply, fill #0

## 2023-03-22 ENCOUNTER — Telehealth: Payer: Self-pay

## 2023-03-22 LAB — COMPLETE METABOLIC PANEL WITH GFR
AG Ratio: 1.2 (calc) (ref 1.0–2.5)
ALT: 13 U/L (ref 6–29)
AST: 16 U/L (ref 10–35)
Albumin: 4 g/dL (ref 3.6–5.1)
Alkaline phosphatase (APISO): 86 U/L (ref 37–153)
BUN: 15 mg/dL (ref 7–25)
CO2: 28 mmol/L (ref 20–32)
Calcium: 9.7 mg/dL (ref 8.6–10.4)
Chloride: 103 mmol/L (ref 98–110)
Creat: 0.79 mg/dL (ref 0.50–1.03)
Globulin: 3.4 g/dL (ref 1.9–3.7)
Glucose, Bld: 142 mg/dL — ABNORMAL HIGH (ref 65–99)
Potassium: 4.1 mmol/L (ref 3.5–5.3)
Sodium: 140 mmol/L (ref 135–146)
Total Bilirubin: 0.6 mg/dL (ref 0.2–1.2)
Total Protein: 7.4 g/dL (ref 6.1–8.1)
eGFR: 89 mL/min/{1.73_m2} (ref >=60)

## 2023-03-22 LAB — HIV-1 RNA QUANT-NO REFLEX-BLD
HIV 1 RNA Quant: NOT DETECTED {copies}/mL
HIV-1 RNA Quant, Log: NOT DETECTED {Log_copies}/mL

## 2023-03-22 NOTE — Telephone Encounter (Signed)
-----   Message from Rexene Alberts sent at 03/22/2023  4:39 PM EDT ----- Please call Irys and let her know that her medication is working so good! Her viral load is undetectable!!  Immune system is slowly getting better - please continue the great work taking the biktarvy everyday so her immune system can continue to recover.  I would like for her to continue the bactrim 1 tablet every Monday, Wednesday and Friday for now until her next appointment on 06/18/2023  So happy for her!   Thank you

## 2023-03-22 NOTE — Telephone Encounter (Signed)
 Patient aware of results and to take Bactrim 1 tablet every Monday, Wednesday and Friday until appointment on 06/18/2023.   Keymoni Mccaster Lesli Albee, CMA

## 2023-03-26 ENCOUNTER — Other Ambulatory Visit: Payer: Self-pay

## 2023-03-26 ENCOUNTER — Other Ambulatory Visit (HOSPITAL_COMMUNITY): Payer: Self-pay

## 2023-03-30 ENCOUNTER — Other Ambulatory Visit (HOSPITAL_COMMUNITY): Payer: Self-pay

## 2023-04-02 ENCOUNTER — Other Ambulatory Visit: Payer: Self-pay

## 2023-04-05 ENCOUNTER — Other Ambulatory Visit: Payer: Self-pay

## 2023-04-07 ENCOUNTER — Other Ambulatory Visit: Payer: Self-pay

## 2023-04-12 ENCOUNTER — Other Ambulatory Visit: Payer: Self-pay

## 2023-04-12 ENCOUNTER — Other Ambulatory Visit: Payer: Self-pay | Admitting: Pharmacy Technician

## 2023-04-12 ENCOUNTER — Other Ambulatory Visit (HOSPITAL_COMMUNITY): Payer: Self-pay

## 2023-04-12 MED ORDER — DEBROX 6.5 % OT SOLN: 4.0000 [drp] | Freq: Two times a day (BID) | OTIC | 2 refills | Status: AC

## 2023-04-12 MED ORDER — TRULICITY 1.5 MG/0.5ML ~~LOC~~ SOAJ
1.5000 mg | SUBCUTANEOUS | 2 refills | Status: DC
  Filled 2023-04-12: qty 2, 28d supply, fill #0

## 2023-04-12 MED ORDER — MELOXICAM 7.5 MG PO TABS
7.5000 mg | ORAL_TABLET | Freq: Two times a day (BID) | ORAL | 2 refills | Status: DC | PRN
  Filled 2023-04-12 – 2023-04-13 (×2): qty 60, 30d supply, fill #0

## 2023-04-12 NOTE — Progress Notes (Signed)
 Specialty Pharmacy Refill Coordination Note  Yazaira SHAQUANDRA GALANO is a 53 y.o. female contacted today regarding refills of specialty medication(s) Bictegravir-Emtricitab-Tenofov Musician)   Patient requested Delivery   Delivery date: 04/13/23   Verified address: [address] [place]   Medication will be filled on 04/12/23.

## 2023-04-13 ENCOUNTER — Other Ambulatory Visit (HOSPITAL_COMMUNITY): Payer: Self-pay

## 2023-04-13 MED ORDER — AMOXICILLIN 500 MG PO CAPS
500.0000 mg | ORAL_CAPSULE | Freq: Three times a day (TID) | ORAL | 0 refills | Status: DC
  Filled 2023-04-13: qty 21, 7d supply, fill #0

## 2023-04-14 ENCOUNTER — Other Ambulatory Visit (HOSPITAL_COMMUNITY): Payer: Self-pay

## 2023-04-15 ENCOUNTER — Other Ambulatory Visit: Payer: Self-pay

## 2023-04-15 ENCOUNTER — Other Ambulatory Visit (HOSPITAL_COMMUNITY): Payer: Self-pay

## 2023-04-16 ENCOUNTER — Other Ambulatory Visit: Payer: Self-pay

## 2023-04-16 ENCOUNTER — Other Ambulatory Visit (HOSPITAL_COMMUNITY): Payer: Self-pay

## 2023-04-19 ENCOUNTER — Other Ambulatory Visit: Payer: Self-pay

## 2023-04-19 ENCOUNTER — Other Ambulatory Visit (HOSPITAL_COMMUNITY): Payer: Self-pay

## 2023-04-20 ENCOUNTER — Other Ambulatory Visit: Payer: Self-pay | Admitting: Internal Medicine

## 2023-04-20 ENCOUNTER — Other Ambulatory Visit (HOSPITAL_COMMUNITY): Payer: Self-pay

## 2023-04-20 DIAGNOSIS — E1165 Type 2 diabetes mellitus with hyperglycemia: Secondary | ICD-10-CM

## 2023-04-20 MED ORDER — IBUPROFEN 800 MG PO TABS
800.0000 mg | ORAL_TABLET | Freq: Four times a day (QID) | ORAL | 0 refills | Status: DC | PRN
  Filled 2023-04-20: qty 21, 6d supply, fill #0

## 2023-04-20 MED ORDER — METFORMIN HCL 500 MG PO TABS
1000.0000 mg | ORAL_TABLET | Freq: Two times a day (BID) | ORAL | 1 refills | Status: DC
  Filled 2023-04-20: qty 360, 90d supply, fill #0

## 2023-04-21 ENCOUNTER — Other Ambulatory Visit (HOSPITAL_COMMUNITY): Payer: Self-pay

## 2023-04-22 ENCOUNTER — Other Ambulatory Visit (HOSPITAL_COMMUNITY): Payer: Self-pay

## 2023-04-26 ENCOUNTER — Other Ambulatory Visit (HOSPITAL_COMMUNITY): Payer: Self-pay

## 2023-04-26 MED ORDER — ACCU-CHEK SOFTCLIX LANCETS MISC
5 refills | Status: AC
  Filled 2023-04-26 – 2023-04-28 (×2): qty 100, 90d supply, fill #0

## 2023-04-26 MED ORDER — ACCU-CHEK GUIDE W/DEVICE KIT
PACK | 5 refills | Status: AC
  Filled 2023-04-26 – 2023-04-28 (×2): qty 1, 30d supply, fill #0

## 2023-04-26 MED ORDER — ACCU-CHEK GUIDE TEST VI STRP
ORAL_STRIP | 5 refills | Status: AC
Start: 2023-04-26 — End: ?
  Filled 2023-04-26: qty 100, 90d supply, fill #0
  Filled 2023-04-28: qty 50, 34d supply, fill #0

## 2023-04-26 MED ORDER — FLUCONAZOLE 150 MG PO TABS
150.0000 mg | ORAL_TABLET | ORAL | 0 refills | Status: DC
  Filled 2023-04-26 – 2023-04-28 (×2): qty 2, 14d supply, fill #0

## 2023-04-28 ENCOUNTER — Other Ambulatory Visit (HOSPITAL_COMMUNITY): Payer: Self-pay

## 2023-04-29 ENCOUNTER — Telehealth: Payer: Self-pay | Admitting: Emergency Medicine

## 2023-04-29 NOTE — Telephone Encounter (Signed)
 LVM for patient to return call.

## 2023-04-29 NOTE — Telephone Encounter (Signed)
 Copied from CRM [id number]. Topic: General - Other >> Apr 29, 2023 10:19 AM [place] E wrote: Reason for CRM: [facility] called stating that they tried faxing over a form for a transfer of medications for the patient. Was faxed on 4/15, representative stating she tried faxing again today 4/17. This form is to transfer patient's active medications to a new pharmacy, [facility]. Phone number: [phone] and fax number: [phone].

## 2023-04-30 ENCOUNTER — Other Ambulatory Visit: Payer: Self-pay

## 2023-04-30 ENCOUNTER — Other Ambulatory Visit: Payer: Self-pay | Admitting: Physician Assistant

## 2023-04-30 DIAGNOSIS — Z1231 Encounter for screening mammogram for malignant neoplasm of breast: Secondary | ICD-10-CM

## 2023-05-03 ENCOUNTER — Other Ambulatory Visit (HOSPITAL_COMMUNITY): Payer: Self-pay

## 2023-05-03 ENCOUNTER — Telehealth: Payer: Self-pay | Admitting: Emergency Medicine

## 2023-05-03 NOTE — Telephone Encounter (Signed)
 Copied from CRM [id number]. Topic: Clinical - Prescription Issue >> May 03, 2023 11:02 AM Chuck Crater wrote: Reason for CRM: Gly with [facility] in [place], [place] is needing a new prescription for all active prescriptions for patient. lisinopril -hydrochlorothiazide  (ZESTORETIC ) 20-12.5 MG tablet Dulaglutide  (TRULICITY ) 1.5 MG/0.5ML SOAJ metFORMIN  (GLUCOPHAGE ) 500 MG tablet meloxicam  (MOBIC ) 7.5 MG tablet ibuprofen  (ADVIL ) 800 MG tablet bictegravir-emtricitabine -tenofovir  AF (BIKTARVY ) 50-200-25 MG TABS tablet amoxicillin  (AMOXIL ) 500 MG capsule  If there is more than 7 active prescriptions on our end for patient, they are requesting that as well.

## 2023-05-05 NOTE — Telephone Encounter (Signed)
 Copied from CRM [id number]. Topic: Clinical - Prescription Issue >> May 05, 2023 10:31 AM Clydene Darner H wrote: Patient is returning a callback from Monday. She stated she received a message but does not recall the name of the person who left it. Requesting clarification or a follow-up.  Callback Number: [phone]

## 2023-05-05 NOTE — Telephone Encounter (Signed)
 Forms received. Placed in providers office to be advised and signed

## 2023-05-06 ENCOUNTER — Other Ambulatory Visit (HOSPITAL_COMMUNITY): Payer: Self-pay

## 2023-05-06 ENCOUNTER — Other Ambulatory Visit: Payer: Self-pay

## 2023-05-06 NOTE — Progress Notes (Signed)
 Patient has transferred medication to [facility]. Disenrolling.

## 2023-05-07 NOTE — Telephone Encounter (Signed)
 Forms received and placed in providers office to be reviewed and signed

## 2023-05-11 ENCOUNTER — Other Ambulatory Visit: Payer: Self-pay

## 2023-05-11 ENCOUNTER — Other Ambulatory Visit: Payer: Self-pay | Admitting: Radiology

## 2023-05-11 DIAGNOSIS — E785 Hyperlipidemia, unspecified: Secondary | ICD-10-CM

## 2023-05-11 DIAGNOSIS — E1165 Type 2 diabetes mellitus with hyperglycemia: Secondary | ICD-10-CM

## 2023-05-11 DIAGNOSIS — I10 Essential (primary) hypertension: Secondary | ICD-10-CM

## 2023-05-11 DIAGNOSIS — E1159 Type 2 diabetes mellitus with other circulatory complications: Secondary | ICD-10-CM

## 2023-05-11 MED ORDER — METFORMIN HCL 500 MG PO TABS: 1000.0000 mg | ORAL_TABLET | Freq: Two times a day (BID) | ORAL | 1 refills | Status: DC

## 2023-05-11 MED ORDER — AMLODIPINE BESYLATE 5 MG PO TABS: 5.0000 mg | ORAL_TABLET | Freq: Every day | ORAL | 3 refills | Status: DC

## 2023-05-11 MED ORDER — BIKTARVY 50-200-25 MG PO TABS
1.0000 | ORAL_TABLET | Freq: Every day | ORAL | 11 refills | Status: DC
  Filled 2023-07-14: qty 30, 30d supply, fill #0
  Filled 2023-08-03 – 2023-08-19 (×3): qty 30, 30d supply, fill #1
  Filled 2023-09-09 – 2023-09-18 (×3): qty 30, 30d supply, fill #2

## 2023-05-11 MED ORDER — ATORVASTATIN CALCIUM 20 MG PO TABS: 20.0000 mg | ORAL_TABLET | Freq: Every day | ORAL | 3 refills | Status: DC

## 2023-05-11 MED ORDER — IBUPROFEN 800 MG PO TABS: 800.0000 mg | ORAL_TABLET | Freq: Four times a day (QID) | ORAL | 0 refills | Status: DC | PRN

## 2023-05-11 MED ORDER — MELOXICAM 7.5 MG PO TABS: 7.5000 mg | ORAL_TABLET | Freq: Two times a day (BID) | ORAL | 2 refills | Status: DC | PRN

## 2023-05-11 MED ORDER — TRULICITY 1.5 MG/0.5ML ~~LOC~~ SOAJ
1.5000 mg | SUBCUTANEOUS | 2 refills | Status: AC
Start: 2023-05-11 — End: ?

## 2023-05-11 MED ORDER — LISINOPRIL-HYDROCHLOROTHIAZIDE 20-12.5 MG PO TABS: 1.0000 | ORAL_TABLET | Freq: Every day | ORAL | 3 refills | Status: DC

## 2023-05-11 NOTE — Telephone Encounter (Signed)
 Requested prescriptions has been sent to [facility]

## 2023-05-11 NOTE — Telephone Encounter (Signed)
 Copied from CRM [id number]. Topic: Clinical - Medication Question >> May 11, 2023 10:37 AM Bambi Bonine D wrote: Reason for CRM: [facility] called stating that they tried faxing over a form for a transfer of medications for the patient. Was faxed on 4/15, representative stating she tried faxing again om 4/17. This form is to transfer patient's active medications to a new pharmacy, [facility]. Phone number: [phone], fax number: [phone], and main line: [phone].   Update: [facility] is calling back regarding the transfer of active medications to the pharmacy. Pharmacy would like a call back regarding this concern today if possible.

## 2023-05-19 ENCOUNTER — Other Ambulatory Visit (HOSPITAL_COMMUNITY): Payer: Self-pay

## 2023-05-19 ENCOUNTER — Other Ambulatory Visit: Payer: Self-pay | Admitting: Emergency Medicine

## 2023-05-19 DIAGNOSIS — E1159 Type 2 diabetes mellitus with other circulatory complications: Secondary | ICD-10-CM

## 2023-05-19 MED ORDER — IBUPROFEN 800 MG PO TABS
800.0000 mg | ORAL_TABLET | Freq: Four times a day (QID) | ORAL | 0 refills | Status: DC | PRN
  Filled 2023-05-19: qty 21, 6d supply, fill #0

## 2023-05-24 ENCOUNTER — Other Ambulatory Visit (HOSPITAL_COMMUNITY): Payer: Self-pay

## 2023-05-27 ENCOUNTER — Ambulatory Visit
Admission: RE | Admit: 2023-05-27 | Discharge: 2023-05-27 | Disposition: A | Discharge status: Home / Self Care | Source: Ambulatory Visit | Attending: Physician Assistant | Admitting: Physician Assistant

## 2023-05-27 DIAGNOSIS — Z1231 Encounter for screening mammogram for malignant neoplasm of breast: Secondary | ICD-10-CM

## 2023-05-27 NOTE — Progress Notes (Signed)
 The 10-year ASCVD risk score (Arnett DK, et al., 2019) is: 24.2%   Values used to calculate the score:     Age: 53 years     Sex: Female     Is Non-Hispanic African American: Yes     Diabetic: Yes     Tobacco smoker: No     Systolic Blood Pressure: 173 mmHg     Is BP treated: Yes     HDL Cholesterol: 59.2 mg/dL     Total Cholesterol: 169 mg/dL  Currently prescribed atorvastatin  20 mg.   Godric Lavell, BSN, RN

## 2023-05-28 ENCOUNTER — Other Ambulatory Visit (HOSPITAL_COMMUNITY): Payer: Self-pay

## 2023-05-28 MED ORDER — MELOXICAM 7.5 MG PO TABS
7.5000 mg | ORAL_TABLET | Freq: Two times a day (BID) | ORAL | 2 refills | Status: AC
  Filled 2023-05-28: qty 60, 30d supply, fill #0
  Filled 2023-08-13 – 2023-08-16 (×2): qty 60, 30d supply, fill #1

## 2023-05-28 MED ORDER — TRULICITY 1.5 MG/0.5ML ~~LOC~~ SOAJ
1.5000 mg | SUBCUTANEOUS | 2 refills | Status: DC
Start: 2023-05-27 — End: 2023-11-26
  Filled 2023-05-28: qty 2, 28d supply, fill #0

## 2023-05-28 MED ORDER — METFORMIN HCL 500 MG PO TABS
1000.0000 mg | ORAL_TABLET | Freq: Two times a day (BID) | ORAL | 5 refills | Status: DC
Start: 2023-05-27 — End: 2023-07-15
  Filled 2023-05-28: qty 120, 30d supply, fill #0

## 2023-05-28 MED ORDER — LISINOPRIL-HYDROCHLOROTHIAZIDE 20-12.5 MG PO TABS
1.0000 | ORAL_TABLET | Freq: Every day | ORAL | 5 refills | Status: DC
  Filled 2023-05-28: qty 30, 30d supply, fill #0
  Filled 2023-07-14 (×3): qty 30, 30d supply, fill #1

## 2023-06-02 ENCOUNTER — Other Ambulatory Visit (HOSPITAL_COMMUNITY): Payer: Self-pay

## 2023-06-09 ENCOUNTER — Other Ambulatory Visit (HOSPITAL_COMMUNITY): Payer: Self-pay

## 2023-06-09 MED ORDER — TRULICITY 3 MG/0.5ML ~~LOC~~ SOAJ
3.0000 mg | SUBCUTANEOUS | 2 refills | Status: AC
  Filled 2023-06-09: qty 2, 28d supply, fill #0
  Filled 2023-07-21: qty 2, 28d supply, fill #1
  Filled 2023-08-13 – 2023-08-16 (×2): qty 2, 28d supply, fill #2
  Filled 2023-09-20: qty 2, 28d supply, fill #3
  Filled 2023-10-22: qty 2, 28d supply, fill #4
  Filled 2023-12-06: qty 2, 28d supply, fill #5

## 2023-06-18 ENCOUNTER — Ambulatory Visit: Admitting: Infectious Diseases

## 2023-06-21 ENCOUNTER — Other Ambulatory Visit (HOSPITAL_COMMUNITY): Payer: Self-pay

## 2023-07-14 ENCOUNTER — Other Ambulatory Visit: Payer: Self-pay

## 2023-07-14 ENCOUNTER — Other Ambulatory Visit (HOSPITAL_COMMUNITY): Payer: Self-pay

## 2023-07-14 NOTE — Progress Notes (Signed)
 Specialty Pharmacy Refill Coordination Note  Katrina Parker is a 53 y.o. female contacted today regarding refills of specialty medication(s) Bictegravir-Emtricitab-Tenofov (Biktarvy )   Patient requested [name] at [facility] [place] date: 07/14/23   Medication will be filled on 07/14/23.   Patient has changed her mind about transfer of care to alternate pharmacy and has been re-enrolled.

## 2023-07-15 ENCOUNTER — Other Ambulatory Visit: Payer: Self-pay

## 2023-07-15 ENCOUNTER — Ambulatory Visit: Admitting: Infectious Diseases

## 2023-07-15 ENCOUNTER — Encounter: Payer: Self-pay | Admitting: Infectious Diseases

## 2023-07-15 VITALS — BP 146/92 | HR 94 | Temp 98.4°F | Ht 60.0 in | Wt 220.0 lb

## 2023-07-15 DIAGNOSIS — Z5941 Food insecurity: Secondary | ICD-10-CM

## 2023-07-15 DIAGNOSIS — B20 Human immunodeficiency virus [HIV] disease: Secondary | ICD-10-CM

## 2023-07-15 NOTE — Patient Instructions (Addendum)
  [facility] for Case Management help  Address: [address], [place], [place] [address] Phone: [phone]  I called to leave request for a referral call to you for new client enrollment   Call the [facility] on Monday about your surgery  Phone: [phone]

## 2023-07-15 NOTE — Progress Notes (Signed)
 Name: Katrina Parker  DOB: Dec 23, 1970 MRN: [id number] PCP: Purcell Emil Schanz, MD    Brief Narrative:  Katrina Parker is a 53 y.o. female with HIV, Stage 3, diagnosed 2008 after move from [place]. CD4 nadir 170 (recently)  VL unknown Transmission Risk: sexual  History of OIs: none History of STIs: Hep B sAg (- 2008), sAb (+ 2008), cAb (+ 2008); Hep A (+), Hep C (- 2008) Quantiferon (- 2010)   Previous Regimens: Atripla Josefina Delpha Delpha 2017 Biktarvy  2024  Genotypes: 2024   Subjective   Subjective:   Chief Complaint  Patient presents with   Follow-up    B20      Discussed the use of AI scribe software for clinical note transcription with the patient, who gave verbal consent to proceed.  History of Present Illness   Katrina Parker is a 53 year old female with HIV who presents for routine follow-up care.  She has been adherent to her HIV medication Biktarvy , taking it every night before sleep to avoid dizziness. Her viral load was previously undetectable, and she recently refilled her prescription, remaining committed to maintaining her medication schedule.  She is experiencing significant knee pain following a fall at work, attributed to severe osteoarthritis. There is no cartilage left in her knees, and she is under the care of an orthopedic team. The pain impacts her daily activities, requiring assistance from her grandson for household tasks and errands. She is awaiting a call from the orthopedic team to schedule surgery.  She has applied for Social Security Disability due to her knee condition but was initially denied. She has engaged a Clinical research associate and is appealing the decision. Financial difficulties are present, as she relies on food stamps and church assistance for food. She is concerned about providing for herself and her grandson, who is temporarily living with her.  She recently had all her teeth removed due to pain and swelling and is scheduled to have her  teeth fixed later this year. She expresses feelings of loneliness and a lack of support from her family, despite having three sons. Her grandson provides some assistance, but she struggles with daily activities due to her knee pain.          07/15/2023   10:37 AM  Depression screen PHQ 2/9  Decreased Interest 0  Down, Depressed, Hopeless 0  PHQ - 2 Score 0  Altered sleeping 0  Tired, decreased energy 0  Change in appetite 0  Feeling bad or failure about yourself  0  Trouble concentrating 0  Moving slowly or fidgety/restless 0  Suicidal thoughts 0  PHQ-9 Score 0    Review of Systems  Constitutional:  Negative for chills, fever, malaise/fatigue and weight loss.  HENT:  Negative for sore throat.        No dental problems  Respiratory:  Negative for cough and sputum production.   Cardiovascular:  Negative for chest pain and leg swelling.  Gastrointestinal:  Negative for abdominal pain, diarrhea and vomiting.  Genitourinary:  Negative for dysuria and flank pain.  Musculoskeletal:  Positive for joint pain. Negative for myalgias and neck pain.  Skin:  Negative for rash.  Neurological:  Negative for dizziness, tingling and headaches.  Psychiatric/Behavioral:  Positive for depression. Negative for substance abuse. The patient is not nervous/anxious and does not have insomnia.      Past Medical History:  Diagnosis Date   Diabetes mellitus without complication (HCC)    HIV (human immunodeficiency virus infection) (  HCC)     Outpatient Medications Prior to Visit  Medication Sig Dispense Refill   Accu-Chek Softclix Lancets lancets Test glucose once daily 100 each 5   amLODipine  (NORVASC ) 5 MG tablet Take 1 tablet (5 mg total) by mouth daily. 90 tablet 3   atorvastatin  (LIPITOR) 20 MG tablet Take 1 tablet (20 mg total) by mouth daily. 90 tablet 3   bictegravir-emtricitabine -tenofovir  AF (BIKTARVY ) 50-200-25 MG TABS tablet Take 1 tablet by mouth daily. 30 tablet 11   Blood Glucose  Monitoring Suppl (ACCU-CHEK GUIDE) w/Device KIT Use once daily to test glucose 1 kit 5   carbamide peroxide (DEBROX) 6.5 % OTIC solution Place 4 drops into the affected ear(s) 2 (two) times daily. 1 mL 2   diclofenac  Sodium (VOLTAREN ) 1 % GEL Apply 2.25 inch topically to affected area 4 times a day 100 g 0   Dulaglutide  (TRULICITY ) 1.5 MG/0.5ML SOAJ Inject 1.5 mg into the skin once a week. 4 mL 2   Dulaglutide  (TRULICITY ) 1.5 MG/0.5ML SOAJ Inject 0.5 mL (1.5 mg) into the skin once a week. 2 mL 2   Dulaglutide  (TRULICITY ) 3 MG/0.5ML SOAJ Inject 3 mg into the skin once a week. 12 mL 2   fluconazole  (DIFLUCAN ) 150 MG tablet Take 1 tablet (150 mg total) by mouth once a week as needed 2 tablet 0   fluticasone (FLONASE) 50 MCG/ACT nasal spray Place into the nose.     glucose blood (ACCU-CHEK GUIDE TEST) test strip Use once daily to test glucose 100 strip 5   glucose monitoring kit (FREESTYLE) monitoring kit 1 each by Does not apply route 2 (two) times daily at 8 am and 10 pm. 1 each 0   ibuprofen  (ADVIL ) 800 MG tablet Take 1 tablet (800 mg total) by mouth every 6 (six) hours as needed for pain. 21 tablet 0   lisinopril -hydrochlorothiazide  (ZESTORETIC ) 20-12.5 MG tablet Take 1 tablet by mouth daily. 90 tablet 3   ONE TOUCH ULTRA TEST test strip Reported on 06/26/2015  3   tiZANidine  (ZANAFLEX ) 4 MG tablet Take 1 tablet (4 mg total) by mouth every 8 (eight) hours as needed. 30 tablet 0   meloxicam  (MOBIC ) 7.5 MG tablet Take 1 tablet (7.5 mg total) by mouth with food 2 (two) times daily. 60 tablet 2   metFORMIN  (GLUCOPHAGE ) 500 MG tablet Take 2 tablets (1,000 mg total) by mouth 2 (two) times daily with a meal. For your sugar 360 tablet 1   amoxicillin  (AMOXIL ) 500 MG capsule Take 1 capsule (500 mg total) by mouth every 8 (eight) hours. (Patient not taking: Reported on 07/15/2023) 21 capsule 0   ibuprofen  (ADVIL ) 800 MG tablet Take 1 tablet (800 mg total) by mouth every 6 (six) hours as needed for pain. 21  tablet 0   lisinopril -hydrochlorothiazide  (ZESTORETIC ) 20-12.5 MG tablet Take 1 tablet by mouth daily. (Patient not taking: Reported on 07/15/2023) 30 tablet 5   meloxicam  (MOBIC ) 7.5 MG tablet Take 1 tablet (7.5 mg total) by mouth 2 (two) times daily with food as needed for pain. (Patient not taking: Reported on 07/15/2023) 60 tablet 2   metFORMIN  (GLUCOPHAGE ) 500 MG tablet Take 2 tablets (1,000 mg total) by mouth 2 (two) times daily. (Patient not taking: Reported on 07/15/2023) 120 tablet 5   sulfamethoxazole -trimethoprim  (BACTRIM  DS) 800-160 MG tablet Take 1 tablet by mouth 3 (three) times a week. (Patient not taking: Reported on 07/15/2023) 30 tablet 5   No facility-administered medications prior to visit.  Allergies  Allergen Reactions   Farxiga  [Dapagliflozin ] Rash    Social History   Tobacco Use   Smoking status: Never   Smokeless tobacco: Never  Substance Use Topics   Alcohol use: No    Alcohol/week: 0.0 standard drinks of alcohol   Drug use: No    Social History   Substance and Sexual Activity  Sexual Activity Not Currently   Partners: Male   Comment: declined condoms        Objective   Objective:   Vitals:   07/15/23 1032  BP: (!) 146/92  Pulse: 94  Temp: 98.4 F (36.9 C)  TempSrc: Oral  SpO2: 96%  Weight: 220 lb (99.8 kg)  Height: 5' (1.524 m)   Body mass index is 42.97 kg/m.  Physical Exam Cardiovascular:     Rate and Rhythm: Normal rate.  Pulmonary:     Effort: Pulmonary effort is normal.     Comments: No shortness of breath detected in conversation.  Neurological:     Mental Status: She is oriented to person, place, and time.  Psychiatric:        Mood and Affect: Mood normal.        Behavior: Behavior normal.        Thought Content: Thought content normal.        Judgment: Judgment normal.       Assessment & Plan:    HIV infection - AIDS (CD4 178), VL < 20 -  HIV infection is well-managed with Biktarvy . Her CD4 has shown some signs  of recovery. Will repeat today.  She is adherent to medication regimen, taking it at night to avoid dizziness associated with taking it without food. - Recheck viral load and CD4 count today - Continue current antiretroviral therapy regimen - Refer to [employer] for case management and access to food pantry  Food Insecurity -  Case Management Needs -  She would benefit from case manager to help navigate the SSI piece and assist with food insecurity. I do not believe her SNAP benefits will change for her grandson's temporary residence. I encouraged her to see if her son could help with providing some food coverage for her grandson while staying there.  I personally called and left a message for [facility] on intake line and placed referral today. I asked Ornella to please let us  know if she has not heard from them and encouraged her to call next week again to follow up   Severe osteoarthritis of knees - Severe osteoarthritis of the knee, confirmed by imaging showing no cartilage. She experiences significant pain and functional limitations, requiring assistance with daily activities. Orthopedic team has recommended surgery -she is awaiting call back from surgery team to schedule.  Planning staged approach with left first then right.  Not an absolute contraindication if she has not fully recovered CD4 count - this will more than likely take months and years on treatment. As long as she is virologically suppressed she should do fine with surgery recovery. She is miserable and I would advocate for her TKRs to be prioritized. - Encourage her to contact orthopedic office on Monday if no call is received - Provide orthopedic office phone number to her - Pending VL today, would approve her from ID perspective.   Recording duration: 20 minutes      Health Maintenance -  Defer pap smear until knees are better and able to get on table for exam Shingrix  at follow up  To  discuss  mammography as well at next OV in 81m     Orders Placed This Encounter  Procedures   HIV 1 RNA quant-no reflex-bld   T-helper cells (CD4) count   AMB REFERRAL TO COMMUNITY SERVICE AGENCY    Referral Priority:   Routine    Referral Type:   Community Service    Number of Visits Requested:   1    No orders of the defined types were placed in this encounter.   Return in about 4 months (around 11/15/2023).    Corean Fireman, MSN, NP-C [facility]  [email or web address] Pager: [phone] Office: [phone] [facility] Main Line: [phone] *Secure Chat Communication Welcome

## 2023-07-18 ENCOUNTER — Ambulatory Visit: Payer: Self-pay | Admitting: Infectious Diseases

## 2023-07-18 LAB — T-HELPER CELLS (CD4) COUNT (NOT AT ARMC)
Absolute CD4: 236 {cells}/uL — ABNORMAL LOW (ref 490–1740)
CD4 T Helper %: 16 % — ABNORMAL LOW (ref 30–61)
Total lymphocyte count: 1516 {cells}/uL (ref 850–3900)

## 2023-07-18 LAB — HIV-1 RNA QUANT-NO REFLEX-BLD
HIV 1 RNA Quant: NOT DETECTED {copies}/mL
HIV-1 RNA Quant, Log: NOT DETECTED {Log_copies}/mL

## 2023-07-19 NOTE — Telephone Encounter (Signed)
 Attempted to call patient regarding results. Left voicemail stating results came back looking good and to call with any questions. Did not disclose status in message or additional information. Katrina Parker Code

## 2023-07-19 NOTE — Telephone Encounter (Signed)
-----   Message from [place] sent at 07/18/2023 10:53 PM EDT ----- Please give Katrina Parker a call to tell her that her viral load is undetectable and her immune system is recovering! Her CD4 is 236. Better everyday. Tell her to keep up the good work so she can look at  getting her knee surgeries and recover well.   ----- Message ----- From: Rebecka, [facility] Results In Sent: 07/16/2023  11:46 AM EDT To: Katrina LOISE Fireman, NP

## 2023-07-21 ENCOUNTER — Other Ambulatory Visit (HOSPITAL_COMMUNITY): Payer: Self-pay

## 2023-07-23 ENCOUNTER — Other Ambulatory Visit: Payer: Self-pay

## 2023-08-03 ENCOUNTER — Other Ambulatory Visit (HOSPITAL_COMMUNITY): Payer: Self-pay

## 2023-08-04 ENCOUNTER — Other Ambulatory Visit: Payer: Self-pay

## 2023-08-04 ENCOUNTER — Other Ambulatory Visit (HOSPITAL_COMMUNITY): Payer: Self-pay

## 2023-08-04 MED ORDER — IBUPROFEN 800 MG PO TABS
800.0000 mg | ORAL_TABLET | Freq: Four times a day (QID) | ORAL | 0 refills | Status: AC | PRN
  Filled 2023-08-04: qty 21, 6d supply, fill #0

## 2023-08-04 MED ORDER — ACETAMINOPHEN-CODEINE 300-30 MG PO TABS
1.0000 | ORAL_TABLET | Freq: Four times a day (QID) | ORAL | 0 refills | Status: AC
  Filled 2023-08-04: qty 12, 3d supply, fill #0

## 2023-08-06 ENCOUNTER — Other Ambulatory Visit (HOSPITAL_COMMUNITY): Payer: Self-pay

## 2023-08-10 ENCOUNTER — Other Ambulatory Visit (HOSPITAL_COMMUNITY): Payer: Self-pay

## 2023-08-11 ENCOUNTER — Other Ambulatory Visit: Payer: Self-pay

## 2023-08-12 ENCOUNTER — Other Ambulatory Visit: Payer: Self-pay

## 2023-08-13 ENCOUNTER — Other Ambulatory Visit (HOSPITAL_COMMUNITY): Payer: Self-pay

## 2023-08-13 ENCOUNTER — Other Ambulatory Visit: Payer: Self-pay

## 2023-08-16 ENCOUNTER — Other Ambulatory Visit: Payer: Self-pay

## 2023-08-16 ENCOUNTER — Other Ambulatory Visit (HOSPITAL_COMMUNITY): Payer: Self-pay

## 2023-08-16 MED ORDER — LISINOPRIL-HYDROCHLOROTHIAZIDE 20-12.5 MG PO TABS
1.0000 | ORAL_TABLET | Freq: Every day | ORAL | 5 refills | Status: DC
  Filled 2023-08-16: qty 30, 30d supply, fill #0

## 2023-08-17 ENCOUNTER — Other Ambulatory Visit (HOSPITAL_COMMUNITY): Payer: Self-pay

## 2023-08-17 ENCOUNTER — Other Ambulatory Visit: Payer: Self-pay

## 2023-08-19 ENCOUNTER — Other Ambulatory Visit: Payer: Self-pay

## 2023-08-19 ENCOUNTER — Other Ambulatory Visit (HOSPITAL_COMMUNITY): Payer: Self-pay

## 2023-08-19 NOTE — Progress Notes (Signed)
 Specialty Pharmacy Ongoing Clinical Assessment Note  Katrina Parker is a 53 y.o. female who is being followed by the specialty pharmacy service for RxSp HIV   Patient's specialty medication(s) reviewed today: Bictegravir-Emtricitab-Tenofov (Biktarvy )   Missed doses in the last 4 weeks: 0   Patient/Caregiver did not have any additional questions or concerns.   Therapeutic benefit summary: Patient is achieving benefit   Adverse events/side effects summary: No adverse events/side effects   Patient's therapy is appropriate to: Continue    Goals Addressed             This Visit's Progress    Achieve Undetectable HIV Viral Load < 20   On track    Patient is on track. Patient will maintain adherence.  Patient has remained undetectable since 03/18/23      Comply with lab assessments   On track    Patient is on track. Patient will maintain adherence      Increase CD4 count until steady state   On track    Patient is on track. Patient will maintain adherence      Maintain optimal adherence to therapy   On track    Patient is on track. Patient will maintain adherence         Follow up: 3 months  [facility] Specialty Pharmacist

## 2023-08-19 NOTE — Progress Notes (Signed)
 Specialty Pharmacy Refill Coordination Note  Ashmi LING FLESCH is a 53 y.o. female contacted today regarding refills of specialty medication(s) Bictegravir-Emtricitab-Tenofov (Biktarvy )   Patient requested Delivery   Delivery date: 08/20/23   Verified address: [address] [place]   Medication will be filled on 08/19/23.

## 2023-08-20 ENCOUNTER — Other Ambulatory Visit: Payer: Self-pay

## 2023-09-06 ENCOUNTER — Ambulatory Visit: Payer: Medicaid Other | Admitting: Emergency Medicine

## 2023-09-09 ENCOUNTER — Other Ambulatory Visit (HOSPITAL_COMMUNITY): Payer: Self-pay

## 2023-09-14 ENCOUNTER — Other Ambulatory Visit (HOSPITAL_COMMUNITY): Payer: Self-pay

## 2023-09-14 ENCOUNTER — Other Ambulatory Visit: Payer: Self-pay

## 2023-09-16 ENCOUNTER — Other Ambulatory Visit: Payer: Self-pay

## 2023-09-18 ENCOUNTER — Other Ambulatory Visit (HOSPITAL_COMMUNITY): Payer: Self-pay

## 2023-09-20 ENCOUNTER — Other Ambulatory Visit (HOSPITAL_COMMUNITY): Payer: Self-pay

## 2023-09-20 ENCOUNTER — Other Ambulatory Visit: Payer: Self-pay

## 2023-09-27 ENCOUNTER — Other Ambulatory Visit: Payer: Self-pay

## 2023-10-22 ENCOUNTER — Other Ambulatory Visit (HOSPITAL_COMMUNITY): Payer: Self-pay

## 2023-10-22 MED ORDER — MELOXICAM 7.5 MG PO TABS
ORAL_TABLET | ORAL | 2 refills | Status: DC
  Filled 2023-10-22: qty 60, 30d supply, fill #0

## 2023-10-22 MED ORDER — METFORMIN HCL 500 MG PO TABS
1000.0000 mg | ORAL_TABLET | Freq: Two times a day (BID) | ORAL | 5 refills | Status: DC
  Filled 2023-10-22: qty 120, 30d supply, fill #0

## 2023-10-22 MED ORDER — LISINOPRIL-HYDROCHLOROTHIAZIDE 20-12.5 MG PO TABS
1.0000 | ORAL_TABLET | Freq: Every day | ORAL | 5 refills | Status: DC
  Filled 2023-10-22: qty 30, 30d supply, fill #0

## 2023-10-22 MED ORDER — AZITHROMYCIN 250 MG PO TABS
ORAL_TABLET | ORAL | 0 refills | Status: DC
  Filled 2023-10-22: qty 6, 5d supply, fill #0

## 2023-11-02 ENCOUNTER — Other Ambulatory Visit: Payer: Self-pay

## 2023-11-03 ENCOUNTER — Other Ambulatory Visit: Payer: Self-pay

## 2023-11-05 ENCOUNTER — Other Ambulatory Visit (HOSPITAL_COMMUNITY): Payer: Self-pay

## 2023-11-05 MED ORDER — LISINOPRIL-HYDROCHLOROTHIAZIDE 20-12.5 MG PO TABS: 1.0000 | ORAL_TABLET | Freq: Two times a day (BID) | ORAL | 5 refills | Status: DC

## 2023-11-26 ENCOUNTER — Other Ambulatory Visit: Payer: Self-pay

## 2023-11-26 ENCOUNTER — Other Ambulatory Visit (HOSPITAL_COMMUNITY): Payer: Self-pay

## 2023-11-26 ENCOUNTER — Encounter: Payer: Self-pay | Admitting: Infectious Diseases

## 2023-11-26 ENCOUNTER — Ambulatory Visit: Admitting: Infectious Diseases

## 2023-11-26 VITALS — BP 200/105 | HR 96 | Temp 97.7°F

## 2023-11-26 DIAGNOSIS — Z5941 Food insecurity: Secondary | ICD-10-CM

## 2023-11-26 DIAGNOSIS — Z7984 Long term (current) use of oral hypoglycemic drugs: Secondary | ICD-10-CM

## 2023-11-26 DIAGNOSIS — B20 Human immunodeficiency virus [HIV] disease: Secondary | ICD-10-CM

## 2023-11-26 DIAGNOSIS — E785 Hyperlipidemia, unspecified: Secondary | ICD-10-CM

## 2023-11-26 DIAGNOSIS — E119 Type 2 diabetes mellitus without complications: Secondary | ICD-10-CM | POA: Diagnosis not present

## 2023-11-26 DIAGNOSIS — I10 Essential (primary) hypertension: Secondary | ICD-10-CM

## 2023-11-26 MED ORDER — METFORMIN HCL 500 MG PO TABS
1000.0000 mg | ORAL_TABLET | Freq: Two times a day (BID) | ORAL | 5 refills | Status: AC
  Filled 2023-11-26: qty 120, 30d supply, fill #0

## 2023-11-26 MED ORDER — ATORVASTATIN CALCIUM 20 MG PO TABS
20.0000 mg | ORAL_TABLET | Freq: Every day | ORAL | 3 refills | Status: AC
  Filled 2023-11-26: qty 90, 90d supply, fill #0

## 2023-11-26 MED ORDER — BIKTARVY 50-200-25 MG PO TABS
1.0000 | ORAL_TABLET | Freq: Every day | ORAL | 3 refills | Status: AC
  Filled 2023-11-29: qty 90, 90d supply, fill #0

## 2023-11-26 MED ORDER — AMLODIPINE BESYLATE 10 MG PO TABS
10.0000 mg | ORAL_TABLET | Freq: Every day | ORAL | 0 refills | Status: AC
  Filled 2023-11-26: qty 90, 90d supply, fill #0

## 2023-11-26 MED ORDER — LISINOPRIL-HYDROCHLOROTHIAZIDE 20-12.5 MG PO TABS
1.0000 | ORAL_TABLET | Freq: Every day | ORAL | 1 refills | Status: AC
  Filled 2023-11-26: qty 90, 90d supply, fill #0

## 2023-11-26 MED ORDER — ZOSTER VAC RECOMB ADJUVANTED 50 MCG/0.5ML IM SUSR
0.5000 mL | Freq: Once | INTRAMUSCULAR | 0 refills | Status: AC
  Filled 2023-11-26: qty 0.5, 1d supply, fill #0

## 2023-11-26 NOTE — Patient Instructions (Addendum)
 Please send all prescriptions to the [facility] in 90 day supply   She is having trouble affording the copays for her medications and blood pressure is very high with headaches.
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
## 2023-11-29 ENCOUNTER — Other Ambulatory Visit: Payer: Self-pay

## 2023-11-29 ENCOUNTER — Other Ambulatory Visit: Payer: Self-pay | Admitting: Pharmacist

## 2023-11-29 ENCOUNTER — Other Ambulatory Visit (HOSPITAL_COMMUNITY): Payer: Self-pay

## 2023-11-29 LAB — COMPLETE METABOLIC PANEL WITHOUT GFR
AG Ratio: 1.3 (calc) (ref 1.0–2.5)
ALT: 14 U/L (ref 6–29)
AST: 15 U/L (ref 10–35)
Albumin: 4 g/dL (ref 3.6–5.1)
Alkaline phosphatase (APISO): 103 U/L (ref 37–153)
BUN: 19 mg/dL (ref 7–25)
CO2: 26 mmol/L (ref 20–32)
Calcium: 9.4 mg/dL (ref 8.6–10.4)
Chloride: 103 mmol/L (ref 98–110)
Creat: 0.71 mg/dL (ref 0.50–1.03)
Globulin: 3.1 g/dL (ref 1.9–3.7)
Glucose, Bld: 275 mg/dL — ABNORMAL HIGH (ref 65–99)
Potassium: 4.3 mmol/L (ref 3.5–5.3)
Sodium: 139 mmol/L (ref 135–146)
Total Bilirubin: 0.6 mg/dL (ref 0.2–1.2)
Total Protein: 7.1 g/dL (ref 6.1–8.1)

## 2023-11-29 LAB — T-HELPER CELLS (CD4) COUNT (NOT AT ARMC)
Absolute CD4: 257 {cells}/uL — ABNORMAL LOW (ref 490–1740)
CD4 T Helper %: 20 % — ABNORMAL LOW (ref 30–61)
Total lymphocyte count: 1303 {cells}/uL (ref 850–3900)

## 2023-11-29 LAB — HIV-1 RNA QUANT-NO REFLEX-BLD
HIV 1 RNA Quant: <20 {copies}/mL — AB
HIV-1 RNA Quant, Log: <1.3 {Log_copies}/mL — AB

## 2023-11-29 NOTE — Progress Notes (Signed)
 Specialty Pharmacy Ongoing Clinical Assessment Note  Katrina Parker is a 53 y.o. female who is being followed by the specialty pharmacy service for RxSp HIV   Patient's specialty medication(s) reviewed today: Bictegravir-Emtricitab-Tenofov (Biktarvy )   Missed doses in the last 4 weeks: More than 5   Patient/Caregiver did not have any additional questions or concerns.   Therapeutic benefit summary: Patient is achieving benefit   Adverse events/side effects summary: No adverse events/side effects   Patient's therapy is appropriate to: Continue    Goals Addressed             This Visit's Progress    Achieve Undetectable HIV Viral Load < 20   On track    Patient is on track. Patient will maintain adherence.  Patient has remained undetectable since 03/18/23      Comply with lab assessments   On track    Patient is on track. Patient will maintain adherence      Increase CD4 count until steady state   On track    Patient is on track. Patient will maintain adherence      Maintain optimal adherence to therapy   On track    Patient is on track. Patient will maintain adherence         Follow up: 6 months  Alan JINNY Geralds Specialty Pharmacist

## 2023-11-29 NOTE — Progress Notes (Signed)
 Specialty Pharmacy Refill Coordination Note  Katrina Parker is a 53 y.o. female contacted today regarding refills of specialty medication(s) Bictegravir-Emtricitab-Tenofov (Biktarvy )   Patient requested Delivery   Delivery date: 12/01/23   Verified address: [address] [place] [address]   Medication will be filled on: 11/30/23

## 2023-12-06 ENCOUNTER — Other Ambulatory Visit (HOSPITAL_COMMUNITY): Payer: Self-pay

## 2023-12-17 ENCOUNTER — Other Ambulatory Visit (HOSPITAL_COMMUNITY): Payer: Self-pay

## 2023-12-17 MED ORDER — TRULICITY 4.5 MG/0.5ML ~~LOC~~ SOAJ
4.5000 mg | SUBCUTANEOUS | 2 refills | Status: AC
  Filled 2023-12-17 – 2024-01-04 (×2): qty 2, 28d supply, fill #0

## 2024-01-04 ENCOUNTER — Other Ambulatory Visit (HOSPITAL_COMMUNITY): Payer: Self-pay

## 2024-01-10 ENCOUNTER — Other Ambulatory Visit (HOSPITAL_COMMUNITY): Payer: Self-pay

## 2024-02-24 ENCOUNTER — Ambulatory Visit: Admitting: Infectious Diseases

## 2024-03-14 ENCOUNTER — Ambulatory Visit (HOSPITAL_COMMUNITY): Admit: 2024-03-14 | Admitting: Orthopedic Surgery

## 2024-03-14 SURGERY — ARTHROPLASTY, KNEE, TOTAL
Anesthesia: Spinal | Site: Knee | Laterality: Left
